# Patient Record
Sex: Male | Born: 1965 | Race: Black or African American | Hispanic: No | Smoking: Current every day smoker
Health system: Southern US, Community
[De-identification: ages and names within clinical notes are randomized; demographics above are authoritative.]

## PROBLEM LIST (undated history)

## (undated) DIAGNOSIS — I499 Cardiac arrhythmia, unspecified: Secondary | ICD-10-CM

## (undated) DIAGNOSIS — F32A Depression, unspecified: Secondary | ICD-10-CM

## (undated) DIAGNOSIS — F1010 Alcohol abuse, uncomplicated: Secondary | ICD-10-CM

## (undated) DIAGNOSIS — F329 Major depressive disorder, single episode, unspecified: Secondary | ICD-10-CM

## (undated) DIAGNOSIS — I10 Essential (primary) hypertension: Secondary | ICD-10-CM

## (undated) DIAGNOSIS — F909 Attention-deficit hyperactivity disorder, unspecified type: Secondary | ICD-10-CM

---

## 2006-08-27 ENCOUNTER — Emergency Department (HOSPITAL_COMMUNITY): Admission: EM | Admit: 2006-08-27 | Discharge: 2006-08-27 | Payer: Self-pay | Admitting: Emergency Medicine

## 2006-12-19 ENCOUNTER — Emergency Department (HOSPITAL_COMMUNITY): Admission: EM | Admit: 2006-12-19 | Discharge: 2006-12-19 | Payer: Self-pay | Admitting: Emergency Medicine

## 2007-04-23 ENCOUNTER — Emergency Department (HOSPITAL_COMMUNITY): Admission: EM | Admit: 2007-04-23 | Discharge: 2007-04-23 | Payer: Self-pay | Admitting: Emergency Medicine

## 2008-09-06 ENCOUNTER — Emergency Department (HOSPITAL_COMMUNITY): Admission: EM | Admit: 2008-09-06 | Discharge: 2008-09-06 | Payer: Self-pay | Admitting: Emergency Medicine

## 2008-10-18 ENCOUNTER — Ambulatory Visit: Payer: Self-pay | Admitting: Orthopedic Surgery

## 2008-10-18 DIAGNOSIS — M51379 Other intervertebral disc degeneration, lumbosacral region without mention of lumbar back pain or lower extremity pain: Secondary | ICD-10-CM | POA: Insufficient documentation

## 2008-10-18 DIAGNOSIS — M5137 Other intervertebral disc degeneration, lumbosacral region: Secondary | ICD-10-CM

## 2008-10-18 DIAGNOSIS — M549 Dorsalgia, unspecified: Secondary | ICD-10-CM | POA: Insufficient documentation

## 2008-10-25 ENCOUNTER — Encounter: Payer: Self-pay | Admitting: Orthopedic Surgery

## 2008-10-25 ENCOUNTER — Encounter (HOSPITAL_COMMUNITY): Admission: RE | Admit: 2008-10-25 | Discharge: 2008-11-24 | Payer: Self-pay | Admitting: Orthopedic Surgery

## 2008-10-26 ENCOUNTER — Encounter: Payer: Self-pay | Admitting: Orthopedic Surgery

## 2008-11-04 ENCOUNTER — Telehealth: Payer: Self-pay | Admitting: Orthopedic Surgery

## 2008-11-10 ENCOUNTER — Encounter: Payer: Self-pay | Admitting: Orthopedic Surgery

## 2008-12-27 ENCOUNTER — Emergency Department (HOSPITAL_COMMUNITY): Admission: EM | Admit: 2008-12-27 | Discharge: 2008-12-27 | Payer: Self-pay | Admitting: Emergency Medicine

## 2009-04-11 ENCOUNTER — Emergency Department (HOSPITAL_COMMUNITY): Admission: EM | Admit: 2009-04-11 | Discharge: 2009-04-11 | Payer: Self-pay | Admitting: Emergency Medicine

## 2009-05-03 ENCOUNTER — Ambulatory Visit (HOSPITAL_COMMUNITY): Admission: RE | Admit: 2009-05-03 | Discharge: 2009-05-03 | Payer: Self-pay | Admitting: Family Medicine

## 2009-12-30 ENCOUNTER — Ambulatory Visit (HOSPITAL_COMMUNITY): Admission: RE | Admit: 2009-12-30 | Discharge: 2009-12-30 | Payer: Self-pay | Admitting: Family Medicine

## 2010-01-18 ENCOUNTER — Emergency Department (HOSPITAL_COMMUNITY): Admission: EM | Admit: 2010-01-18 | Discharge: 2010-01-18 | Payer: Self-pay | Admitting: Emergency Medicine

## 2010-02-22 ENCOUNTER — Emergency Department (HOSPITAL_COMMUNITY): Admission: EM | Admit: 2010-02-22 | Discharge: 2010-02-22 | Payer: Self-pay | Admitting: Emergency Medicine

## 2010-03-08 ENCOUNTER — Emergency Department (HOSPITAL_COMMUNITY): Admission: EM | Admit: 2010-03-08 | Discharge: 2010-03-08 | Payer: Self-pay | Admitting: Emergency Medicine

## 2010-09-06 LAB — BASIC METABOLIC PANEL
BUN: 10 mg/dL (ref 6–23)
CO2: 29 mEq/L (ref 19–32)
Chloride: 103 mEq/L (ref 96–112)
GFR calc non Af Amer: >60 mL/min (ref >60)
Glucose, Bld: 90 mg/dL (ref 70–99)
Potassium: 3.3 mEq/L — ABNORMAL LOW (ref 3.5–5.1)
Sodium: 139 mEq/L (ref 135–145)

## 2010-09-06 LAB — CBC
HCT: 46 % (ref 39.0–52.0)
MCHC: 34.2 g/dL (ref 30.0–36.0)
MCV: 94.9 fL (ref 78.0–100.0)
WBC: 6.7 10*3/uL (ref 4.0–10.5)

## 2010-09-06 LAB — RAPID URINE DRUG SCREEN, HOSP PERFORMED
Amphetamines: NOT DETECTED
Barbiturates: NOT DETECTED
Benzodiazepines: NOT DETECTED
Opiates: NOT DETECTED

## 2010-09-06 LAB — CK TOTAL AND CKMB (NOT AT ARMC)
CK, MB: 3.7 ng/mL (ref 0.3–4.0)
Relative Index: 0.2 (ref 0.0–2.5)

## 2010-09-06 LAB — DIFFERENTIAL
Basophils Absolute: 0 10*3/uL (ref 0.0–0.1)
Lymphocytes Relative: 23 % (ref 12–46)
Neutro Abs: 4.3 10*3/uL (ref 1.7–7.7)

## 2010-09-10 LAB — BASIC METABOLIC PANEL
BUN: 11 mg/dL (ref 6–23)
CO2: 26 mEq/L (ref 19–32)
Calcium: 9.2 mg/dL (ref 8.4–10.5)
Chloride: 106 mEq/L (ref 96–112)
GFR calc Af Amer: >60 mL/min (ref >60)
Glucose, Bld: 95 mg/dL (ref 70–99)
Sodium: 136 mEq/L (ref 135–145)

## 2010-09-10 LAB — RAPID URINE DRUG SCREEN, HOSP PERFORMED
Amphetamines: NOT DETECTED
Cocaine: NOT DETECTED

## 2010-09-10 LAB — POCT CARDIAC MARKERS: Myoglobin, poc: 54.5 ng/mL (ref 12–200)

## 2010-09-13 LAB — CBC
MCHC: 33.4 g/dL (ref 30.0–36.0)
MCV: 89.1 fL (ref 78.0–100.0)
Platelets: 243 10*3/uL (ref 150–400)
RDW: 13.6 % (ref 11.5–15.5)

## 2010-09-13 LAB — POCT CARDIAC MARKERS
CKMB, poc: <1 ng/mL — ABNORMAL LOW (ref 1.0–8.0)
Troponin i, poc: <0.05 ng/mL (ref 0.00–0.09)

## 2010-09-13 LAB — DIFFERENTIAL
Eosinophils Absolute: 0 10*3/uL (ref 0.0–0.7)
Lymphocytes Relative: 4 % — ABNORMAL LOW (ref 12–46)
Lymphs Abs: 0.3 10*3/uL — ABNORMAL LOW (ref 0.7–4.0)
Monocytes Relative: 7 % (ref 3–12)
Neutro Abs: 6.7 10*3/uL (ref 1.7–7.7)

## 2010-09-13 LAB — LIPASE, BLOOD: Lipase: 14 U/L (ref 11–59)

## 2010-09-13 LAB — BASIC METABOLIC PANEL
Chloride: 107 mEq/L (ref 96–112)
Creatinine, Ser: 0.95 mg/dL (ref 0.4–1.5)
GFR calc non Af Amer: >60 mL/min (ref >60)

## 2010-09-13 LAB — HEPATIC FUNCTION PANEL
ALT: 50 U/L (ref 0–53)
Alkaline Phosphatase: 73 U/L (ref 39–117)
Indirect Bilirubin: 0.8 mg/dL (ref 0.3–0.9)

## 2011-03-19 LAB — CBC
MCHC: 33.6
MCV: 92.5
Platelets: 260
WBC: 7.8

## 2011-03-19 LAB — COMPREHENSIVE METABOLIC PANEL
Calcium: 9.4
Chloride: 102
GFR calc Af Amer: >60
Glucose, Bld: 132 — ABNORMAL HIGH
Potassium: 3.5
Total Protein: 7.8

## 2011-03-19 LAB — DIFFERENTIAL
Lymphocytes Relative: 21
Lymphs Abs: 1.7
Monocytes Absolute: 0.9 — ABNORMAL HIGH
Monocytes Relative: 12 — ABNORMAL HIGH
Neutro Abs: 5.2

## 2011-03-28 ENCOUNTER — Emergency Department (HOSPITAL_COMMUNITY)
Admission: EM | Admit: 2011-03-28 | Discharge: 2011-03-28 | Disposition: A | Discharge status: Home / Self Care | Payer: Self-pay | Attending: Emergency Medicine | Admitting: Emergency Medicine

## 2011-03-28 ENCOUNTER — Encounter: Payer: Self-pay | Admitting: *Deleted

## 2011-03-28 DIAGNOSIS — F909 Attention-deficit hyperactivity disorder, unspecified type: Secondary | ICD-10-CM | POA: Insufficient documentation

## 2011-03-28 DIAGNOSIS — X838XXA Intentional self-harm by other specified means, initial encounter: Secondary | ICD-10-CM | POA: Insufficient documentation

## 2011-03-28 DIAGNOSIS — F3289 Other specified depressive episodes: Secondary | ICD-10-CM | POA: Insufficient documentation

## 2011-03-28 DIAGNOSIS — F329 Major depressive disorder, single episode, unspecified: Secondary | ICD-10-CM | POA: Insufficient documentation

## 2011-03-28 DIAGNOSIS — F17200 Nicotine dependence, unspecified, uncomplicated: Secondary | ICD-10-CM | POA: Insufficient documentation

## 2011-03-28 DIAGNOSIS — F1210 Cannabis abuse, uncomplicated: Secondary | ICD-10-CM | POA: Insufficient documentation

## 2011-03-28 HISTORY — DX: Major depressive disorder, single episode, unspecified: F32.9

## 2011-03-28 HISTORY — DX: Depression, unspecified: F32.A

## 2011-03-28 HISTORY — DX: Attention-deficit hyperactivity disorder, unspecified type: F90.9

## 2011-03-28 LAB — RAPID URINE DRUG SCREEN, HOSP PERFORMED
Barbiturates: NOT DETECTED
Cocaine: NOT DETECTED

## 2011-03-28 LAB — CBC
HCT: 46.4 % (ref 39.0–52.0)
Hemoglobin: 15.3 g/dL (ref 13.0–17.0)
MCH: 29.8 pg (ref 26.0–34.0)
MCHC: 33 g/dL (ref 30.0–36.0)

## 2011-03-28 LAB — COMPREHENSIVE METABOLIC PANEL
Alkaline Phosphatase: 87 U/L (ref 39–117)
BUN: 12 mg/dL (ref 6–23)
Calcium: 9.8 mg/dL (ref 8.4–10.5)
GFR calc Af Amer: >90 mL/min (ref >90)
Glucose, Bld: 103 mg/dL — ABNORMAL HIGH (ref 70–99)
Potassium: 3.7 mEq/L (ref 3.5–5.1)
Total Protein: 8.6 g/dL — ABNORMAL HIGH (ref 6.0–8.3)

## 2011-03-28 LAB — ETHANOL: Alcohol, Ethyl (B): <11 mg/dL (ref 0–11)

## 2011-03-28 NOTE — ED Provider Notes (Signed)
History     CSN: [id number] Arrival date & time: 03/28/2011  1:33 PM     Chief Complaint  Patient presents with  . Suicidal    HPI Pt was seen at 1425.  Per pt, c/o gradual onset and persistence of constant depression and vague SI that began PTA.  Pt states he was in this ED waiting room at the [facility] when he called [facility] and told them he was "stressed" and having "suicidal thoughts."  States he was upset because he has "nowhere to stay" because he was kicked out of his home last night.  Denies any specific plan.  Denies any other complaints.     Past Medical History  Diagnosis Date  . Depression   . ADHD (attention deficit hyperactivity disorder)     History reviewed. No pertinent past surgical history.    History  Substance Use Topics  . Smoking status: Current Everyday Smoker -- 0.5 packs/day  . Smokeless tobacco: Not on file  . Alcohol Use: No      Review of Systems ROS: Statement: All systems negative except as marked or noted in the HPI; Constitutional: Negative for fever and chills. ; ; Eyes: Negative for eye pain, redness and discharge. ; ; ENMT: Negative for ear pain, hoarseness, nasal congestion, sinus pressure and sore throat. ; ; Cardiovascular: Negative for chest pain, palpitations, diaphoresis, dyspnea and peripheral edema. ; ; Respiratory: Negative for cough, wheezing and stridor. ; ; Gastrointestinal: Negative for nausea, vomiting, diarrhea and abdominal pain, blood in stool, hematemesis, jaundice and rectal bleeding. . ; ; Genitourinary: Negative for dysuria, flank pain and hematuria. ; ; Musculoskeletal: Negative for back pain and neck pain. Negative for swelling and trauma.; ; Skin: Negative for pruritus, rash, abrasions, blisters, bruising and skin lesion.; ; Neuro: Negative for headache, lightheadedness and neck stiffness. Negative for weakness, altered level of consciousness , altered mental status, extremity weakness, paresthesias, involuntary  movement, seizure and syncope. +vague SI.   Allergies  Review of patient's allergies indicates no known allergies.  Home Medications  No current outpatient prescriptions on file.  BP 139/94  Pulse 89  Temp(Src) 99.4 F (37.4 C) (Oral)  Resp 20  Ht 5\' 11"  (1.803 m)  Wt 170 lb (77.111 kg)  BMI 23.71 kg/m2  SpO2 96%  Physical Exam 1430: Physical examination:  Nursing notes reviewed; Vital signs and O2 SAT reviewed;  Constitutional: Well developed, Well nourished, Well hydrated, In no acute distress; Head:  Normocephalic, atraumatic; Eyes: EOMI, PERRL, No scleral icterus; ENMT: Mouth and pharynx normal, Mucous membranes moist; Neck: Supple, Full range of motion, No lymphadenopathy; Cardiovascular: Regular rate and rhythm, No murmur, rub, or gallop; Respiratory: Breath sounds clear & equal bilaterally, No rales, rhonchi, wheezes, or rub, Normal respiratory effort/excursion; Chest: Nontender, Movement normal; Abdomen: Soft, Nontender, Nondistended, Normal bowel sounds; Extremities: Pulses normal, No tenderness, No edema, No calf edema or asymmetry.; Neuro: AA&Ox3, Major CN grossly intact.  No gross focal motor or sensory deficits in extremities.; Skin: Color normal, Warm, Dry.  Psych:  Flat affect, poor eye contact.   ED Course  Procedures   1500:  Social Worker called for consult, states she is at [facility] and not here at [facility] today.  No known shelters in [place] to refer to that she knows of, pt would need to get to [facility].  Cannot go to shelter if a question of SI however.  Will need ACT eval.    MDM  MDM Reviewed: nursing note and vitals  Interpretation: labs     Results for orders placed during the hospital encounter of 03/28/11  CBC      Component Value Range   WBC 8.1  4.0 - 10.5 (K/uL)   RBC 5.13  4.22 - 5.81 (MIL/uL)   Hemoglobin 15.3  13.0 - 17.0 (g/dL)   HCT 16.1  09.6 - 04.5 (%)   MCV 90.4  78.0 - 100.0 (fL)   MCH 29.8  26.0 - 34.0 (pg)   MCHC 33.0  30.0 -  36.0 (g/dL)   RDW 40.9  81.1 - 91.4 (%)   Platelets 322  150 - 400 (K/uL)  COMPREHENSIVE METABOLIC PANEL      Component Value Range   Sodium 137  135 - 145 (mEq/L)   Potassium 3.7  3.5 - 5.1 (mEq/L)   Chloride 102  96 - 112 (mEq/L)   CO2 27  19 - 32 (mEq/L)   Glucose, Bld 103 (*) 70 - 99 (mg/dL)   BUN 12  6 - 23 (mg/dL)   Creatinine, Ser 7.82  0.50 - 1.35 (mg/dL)   Calcium 9.8  8.4 - 95.6 (mg/dL)   Total Protein 8.6 (*) 6.0 - 8.3 (g/dL)   Albumin 4.0  3.5 - 5.2 (g/dL)   AST 34  0 - 37 (U/L)   ALT 21  0 - 53 (U/L)   Alkaline Phosphatase 87  39 - 117 (U/L)   Total Bilirubin 0.4  0.3 - 1.2 (mg/dL)   GFR calc non Af Amer >90  >90 (mL/min)   GFR calc Af Amer >90  >90 (mL/min)  ETHANOL      Component Value Range   Alcohol, Ethyl (B) <11  0 - 11 (mg/dL)  ACETAMINOPHEN LEVEL      Component Value Range   Acetaminophen (Tylenol), Serum <15.0  10 - 30 (ug/mL)  URINE RAPID DRUG SCREEN (HOSP PERFORMED)      Component Value Range   Opiates NONE DETECTED  NONE DETECTED    Cocaine NONE DETECTED  NONE DETECTED    Benzodiazepines NONE DETECTED  NONE DETECTED    Amphetamines NONE DETECTED  NONE DETECTED    Tetrahydrocannabinol POSITIVE (*) NONE DETECTED    Barbiturates NONE DETECTED  NONE DETECTED    5:07 PM:  ACT aware of pt, will eval.   9:06 PM:  ACT team Ella eval.  Pt denies further SI at this time.  Apparently pt was hungry and wanted a place to stay.  States he has friends and family in the area and can stay with them tonight.  Contracts for safety.  Will d/c home with [facility] f/u.   Kasiah Manka Allison Quarry, DO 03/29/11 1856

## 2011-03-28 NOTE — ED Notes (Signed)
Pt was in lobby on the phone with [facility]. [facility] called ed to inform us pt was having suicidal thoughts. Pt states he is stressed and has been thinking of killing himself. Pt states he was kicked out of his home last night.

## 2011-03-28 NOTE — ED Notes (Signed)
Pt up to restroom, no obvious distress observed.

## 2011-04-15 ENCOUNTER — Encounter (HOSPITAL_COMMUNITY): Payer: Self-pay | Admitting: Emergency Medicine

## 2011-04-15 ENCOUNTER — Other Ambulatory Visit: Payer: Self-pay

## 2011-04-15 ENCOUNTER — Emergency Department (HOSPITAL_COMMUNITY)
Admission: EM | Admit: 2011-04-15 | Discharge: 2011-04-15 | Discharge status: Against Medical Advice | Payer: Self-pay | Attending: Emergency Medicine | Admitting: Emergency Medicine

## 2011-04-15 DIAGNOSIS — F17200 Nicotine dependence, unspecified, uncomplicated: Secondary | ICD-10-CM | POA: Insufficient documentation

## 2011-04-15 DIAGNOSIS — F329 Major depressive disorder, single episode, unspecified: Secondary | ICD-10-CM | POA: Insufficient documentation

## 2011-04-15 DIAGNOSIS — R5381 Other malaise: Secondary | ICD-10-CM | POA: Insufficient documentation

## 2011-04-15 DIAGNOSIS — Z5320 Procedure and treatment not carried out because of patient's decision for unspecified reasons: Secondary | ICD-10-CM | POA: Insufficient documentation

## 2011-04-15 DIAGNOSIS — R5383 Other fatigue: Secondary | ICD-10-CM | POA: Insufficient documentation

## 2011-04-15 DIAGNOSIS — F3289 Other specified depressive episodes: Secondary | ICD-10-CM | POA: Insufficient documentation

## 2011-04-15 HISTORY — DX: Cardiac arrhythmia, unspecified: I49.9

## 2011-04-15 LAB — RAPID URINE DRUG SCREEN, HOSP PERFORMED
Amphetamines: NOT DETECTED
Barbiturates: NOT DETECTED
Benzodiazepines: NOT DETECTED
Tetrahydrocannabinol: POSITIVE — AB

## 2011-04-15 LAB — BASIC METABOLIC PANEL
CO2: 29 mEq/L (ref 19–32)
Chloride: 105 mEq/L (ref 96–112)
Creatinine, Ser: 0.97 mg/dL (ref 0.50–1.35)
Potassium: 3.6 mEq/L (ref 3.5–5.1)

## 2011-04-15 LAB — CBC
HCT: 45.6 % (ref 39.0–52.0)
Hemoglobin: 14.5 g/dL (ref 13.0–17.0)
MCV: 92.3 fL (ref 78.0–100.0)
RBC: 4.94 MIL/uL (ref 4.22–5.81)
RDW: 15.1 % (ref 11.5–15.5)
WBC: 5.3 10*3/uL (ref 4.0–10.5)

## 2011-04-15 LAB — ETHANOL: Alcohol, Ethyl (B): 194 mg/dL — ABNORMAL HIGH (ref 0–11)

## 2011-04-15 NOTE — ED Notes (Addendum)
Patient states he is depressed and "don't know how to live." Admitted to drinking one 40oz beer tonight.

## 2011-04-15 NOTE — ED Provider Notes (Signed)
History     CSN: [id number] Arrival date & time: 04/15/2011  2:13 AM   First MD Initiated Contact with Patient 04/15/11 0230      Chief Complaint  Patient presents with  . Depression    (Consider location/radiation/quality/duration/timing/severity/associated sxs/prior treatment) The history is provided by the patient.   patient is a 45 year old male admits to feeling depressed and stressed due to life situation. Denies any homicidal or suicidal ideation. Has no other complaints once help with the history depression. Admitted to drinking alcohol tonight the patient is a smoker denied any drug use. Patient has a past medical history depression attention deficit hyperactivity disorder and irregular heartbeat.  Past Medical History  Diagnosis Date  . Depression   . ADHD (attention deficit hyperactivity disorder)   . Irregular heart beat     History reviewed. No pertinent past surgical history.  History reviewed. No pertinent family history.  History  Substance Use Topics  . Smoking status: Current Everyday Smoker -- 0.5 packs/day  . Smokeless tobacco: Not on file  . Alcohol Use: Not on file     weekends      Review of Systems  Constitutional: Positive for fatigue. Negative for fever.  HENT: Negative for congestion and neck pain.   Eyes: Negative for redness and visual disturbance.  Respiratory: Negative for cough, chest tightness and shortness of breath.   Cardiovascular: Negative for chest pain.  Gastrointestinal: Negative for nausea, vomiting, abdominal pain and diarrhea.  Genitourinary: Negative for dysuria.  Musculoskeletal: Negative for back pain.  Skin: Negative for rash.  Neurological: Negative for headaches.  Hematological: Does not bruise/bleed easily.  Psychiatric/Behavioral: Negative for suicidal ideas and self-injury.    Allergies  Review of patient's allergies indicates no known allergies.  Home Medications  No current outpatient prescriptions on  file.  BP 150/98  Pulse 100  Temp(Src) 97.7 F (36.5 C) (Oral)  Resp 20  Ht 5\' 11"  (1.803 m)  Wt 160 lb (72.576 kg)  BMI 22.32 kg/m2  SpO2 98%  Physical Exam  Nursing note and vitals reviewed. Constitutional: He is oriented to person, place, and time. He appears well-developed and well-nourished.  HENT:  Head: Normocephalic and atraumatic.  Eyes: Conjunctivae and EOM are normal. Pupils are equal, round, and reactive to light.  Neck: Normal range of motion. Neck supple.  Cardiovascular: Normal rate, regular rhythm and normal heart sounds.   No murmur heard. Pulmonary/Chest: Effort normal and breath sounds normal.  Abdominal: Soft. Bowel sounds are normal. There is no tenderness.  Musculoskeletal: Normal range of motion. He exhibits no edema.  Neurological: He is alert and oriented to person, place, and time. No cranial nerve deficit. He exhibits normal muscle tone. Coordination normal.  Skin: Skin is warm. No rash noted. No erythema.    ED Course  Procedures (including critical care time)  Labs Reviewed  URINE RAPID DRUG SCREEN (HOSP PERFORMED) - Abnormal; Notable for the following:    Tetrahydrocannabinol POSITIVE (*)    All other components within normal limits  BASIC METABOLIC PANEL  CBC  ETHANOL    Date: 04/15/2011  Rate: 78  Rhythm: normal sinus rhythm  QRS Axis: normal  Intervals: normal  ST/T Wave abnormalities: early repolarization  Conduction Disutrbances:none  Narrative Interpretation:   Old EKG Reviewed: unchanged 04/11/09   No diagnosis found.    MDM   The patient specifically denies any suicidal thoughts does admit to some depression and is currently homeless. Patient admitted to drinking alcohol tonight but will level  is below 200. We'll have the behavioral health team assessed the patient later this morning.        Shelda Jakes, MD 04/15/11 [phone]

## 2011-04-15 NOTE — ED Notes (Signed)
Pt reports he is has some anxiety and "feeling bad and needs help".  Denies suicidal thoughts or plan.  Denies thoughts of harming others.  Pt reports he did drink one 40 oz beer tonight.  No distress noted.

## 2011-05-15 ENCOUNTER — Other Ambulatory Visit: Payer: Self-pay

## 2011-05-15 ENCOUNTER — Emergency Department (HOSPITAL_COMMUNITY)
Admission: EM | Admit: 2011-05-15 | Discharge: 2011-05-15 | Disposition: A | Discharge status: Home / Self Care | Payer: Self-pay | Attending: Emergency Medicine | Admitting: Emergency Medicine

## 2011-05-15 ENCOUNTER — Encounter (HOSPITAL_COMMUNITY): Payer: Self-pay | Admitting: *Deleted

## 2011-05-15 ENCOUNTER — Emergency Department (HOSPITAL_COMMUNITY): Payer: Self-pay

## 2011-05-15 DIAGNOSIS — R059 Cough, unspecified: Secondary | ICD-10-CM | POA: Insufficient documentation

## 2011-05-15 DIAGNOSIS — R071 Chest pain on breathing: Secondary | ICD-10-CM | POA: Insufficient documentation

## 2011-05-15 DIAGNOSIS — F17200 Nicotine dependence, unspecified, uncomplicated: Secondary | ICD-10-CM | POA: Insufficient documentation

## 2011-05-15 DIAGNOSIS — R0789 Other chest pain: Secondary | ICD-10-CM

## 2011-05-15 DIAGNOSIS — J3489 Other specified disorders of nose and nasal sinuses: Secondary | ICD-10-CM | POA: Insufficient documentation

## 2011-05-15 DIAGNOSIS — R05 Cough: Secondary | ICD-10-CM | POA: Insufficient documentation

## 2011-05-15 LAB — POCT I-STAT TROPONIN I: Troponin i, poc: 0 ng/mL (ref 0.00–0.08)

## 2011-05-15 LAB — CBC
MCV: 92.2 fL (ref 78.0–100.0)
Platelets: 233 10*3/uL (ref 150–400)
RDW: 14.5 % (ref 11.5–15.5)
WBC: 5 10*3/uL (ref 4.0–10.5)

## 2011-05-15 LAB — DIFFERENTIAL
Basophils Absolute: 0 10*3/uL (ref 0.0–0.1)
Eosinophils Relative: 0 % (ref 0–5)
Lymphocytes Relative: 21 % (ref 12–46)
Neutro Abs: 3.4 10*3/uL (ref 1.7–7.7)

## 2011-05-15 LAB — BASIC METABOLIC PANEL
CO2: 25 mEq/L (ref 19–32)
Calcium: 10.2 mg/dL (ref 8.4–10.5)
GFR calc Af Amer: >90 mL/min (ref >90)
Sodium: 140 mEq/L (ref 135–145)

## 2011-05-15 MED ORDER — NAPROXEN 500 MG PO TABS: 500.0000 mg | ORAL_TABLET | Freq: Two times a day (BID) | ORAL | Status: DC

## 2011-05-15 MED ORDER — KETOROLAC TROMETHAMINE 30 MG/ML IJ SOLN
30.0000 mg | Freq: Once | INTRAMUSCULAR | Status: AC
  Administered 2011-05-15: 30 mg via INTRAVENOUS
  Filled 2011-05-15: qty 1

## 2011-05-15 MED ORDER — SODIUM CHLORIDE 0.9 % IV SOLN
Freq: Once | INTRAVENOUS | Status: AC
  Administered 2011-05-15: 05:00:00 via INTRAVENOUS

## 2011-05-15 MED ORDER — TRAMADOL HCL 50 MG PO TABS: 50.0000 mg | ORAL_TABLET | Freq: Four times a day (QID) | ORAL | Status: AC | PRN

## 2011-05-15 MED ORDER — MORPHINE SULFATE 4 MG/ML IJ SOLN
4.0000 mg | Freq: Once | INTRAMUSCULAR | Status: AC
  Administered 2011-05-15: 4 mg via INTRAVENOUS
  Filled 2011-05-15: qty 1

## 2011-05-15 NOTE — ED Provider Notes (Signed)
History     CSN: [id number] Arrival date & time: 05/15/2011  3:40 AM   First MD Initiated Contact with Patient 05/15/11 0355      Chief Complaint  Patient presents with  . Chest Pain    (Consider location/radiation/quality/duration/timing/severity/associated sxs/prior treatment) HPI Comments: Recent uri with cough  Patient is a 45 y.o. male presenting with chest pain. The history is provided by the patient. No language interpreter was used.  Chest Pain The chest pain began 5 - 7 days ago. Chest pain occurs constantly. The chest pain is unchanged. The pain is associated with coughing and breathing. The severity of the pain is moderate. The quality of the pain is described as aching and sharp. The pain does not radiate. Exacerbated by: no specific exacerbating measures. Primary symptoms include cough. Pertinent negatives for primary symptoms include no fever, no fatigue, no shortness of breath, no wheezing, no palpitations, no abdominal pain, no nausea, no vomiting and no dizziness.  Pertinent negatives for associated symptoms include no diaphoresis, no near-syncope, no numbness, no paroxysmal nocturnal dyspnea and no weakness. He tried nothing for the symptoms. Risk factors include male gender and smoking/tobacco exposure.     Past Medical History  Diagnosis Date  . Depression   . ADHD (attention deficit hyperactivity disorder)   . Irregular heart beat     History reviewed. No pertinent past surgical history.  History reviewed. No pertinent family history.  History  Substance Use Topics  . Smoking status: Current Everyday Smoker -- 0.5 packs/day  . Smokeless tobacco: Not on file  . Alcohol Use: Yes     weekends      Review of Systems  Constitutional: Negative for fever, diaphoresis, activity change, appetite change and fatigue.  HENT: Positive for congestion. Negative for sore throat, rhinorrhea, neck pain and neck stiffness.   Respiratory: Positive for cough. Negative  for shortness of breath and wheezing.   Cardiovascular: Positive for chest pain. Negative for palpitations and near-syncope.  Gastrointestinal: Negative for nausea, vomiting, abdominal pain, diarrhea and constipation.  Genitourinary: Negative for dysuria, urgency, frequency and flank pain.  Neurological: Negative for dizziness, weakness, light-headedness, numbness and headaches.  All other systems reviewed and are negative.    Allergies  Review of patient's allergies indicates no known allergies.  Home Medications   Current Outpatient Rx  Name Route Sig Dispense Refill  . NAPROXEN 500 MG PO TABS Oral Take 1 tablet (500 mg total) by mouth 2 (two) times daily. 30 tablet 0  . TRAMADOL HCL 50 MG PO TABS Oral Take 1 tablet (50 mg total) by mouth every 6 (six) hours as needed for pain. Maximum dose= 8 tablets per day 15 tablet 0    BP 136/91  Pulse 85  Temp(Src) 98.8 F (37.1 C) (Oral)  Resp 18  SpO2 98%  Physical Exam  Nursing note and vitals reviewed. Constitutional: He is oriented to person, place, and time. He appears well-developed and well-nourished. No distress.  HENT:  Head: Normocephalic and atraumatic.  Mouth/Throat: Oropharynx is clear and moist.  Eyes: Conjunctivae and EOM are normal. Pupils are equal, round, and reactive to light.  Neck: Normal range of motion. Neck supple.  Cardiovascular: Normal rate, regular rhythm, normal heart sounds and intact distal pulses.  Exam reveals no gallop and no friction rub.   No murmur heard. Pulmonary/Chest: Effort normal and breath sounds normal. No respiratory distress. He exhibits tenderness (parasternally bilaterally).  Abdominal: Soft. Bowel sounds are normal. There is no tenderness. There is  no rebound and no guarding.  Musculoskeletal: Normal range of motion. He exhibits no tenderness.  Neurological: He is alert and oriented to person, place, and time. No cranial nerve deficit.  Skin: Skin is warm and dry. No rash noted.     ED Course  Procedures (including critical care time)   Date: 05/15/2011  Rate: 86  Rhythm: normal sinus rhythm  QRS Axis: normal  Intervals: normal  ST/T Wave abnormalities: early repolarization  Conduction Disutrbances:none  Narrative Interpretation:   Old EKG Reviewed: none available  Labs Reviewed  BASIC METABOLIC PANEL - Abnormal; Notable for the following:    Glucose, Bld 114 (*)    All other components within normal limits  CBC  DIFFERENTIAL  POCT I-STAT TROPONIN I  I-STAT TROPONIN I   5:28 AM istat troponin 0.00 - did not cross over in system  Dg Chest Portable 1 View  05/15/2011  *RADIOLOGY REPORT*  Clinical Data: Chest pain  PORTABLE CHEST - 1 VIEW  Comparison: 05/03/2009  Findings: Borderline heart size with normal pulmonary vascularity, likely normal for technique.  No focal airspace consolidation in the lungs.  No pneumothorax.  No blunting of costophrenic angles. No significant change since previous study.  IMPRESSION: No evidence of active pulmonary disease.  Original Report Authenticated By: Marlon Pel, M.D.     1. Chest wall pain       MDM  Chest wall pain. I have exceedingly low concern for acute coronary syndrome as it causes pain as it has been constant for one week. Single troponin was performed and negative. Chest x-ray performed unremarkable. His pain was treated numerous department with some improvement. Patient is PERC negative with low clinical Gestalt for PE. I feel his pain is likely secondary to chest wall pain versus costochondritis. He did have a recent upper respiratory illness. He'll be treated with Naprosyn and Ultram instructed to followup with primary care physician         Dayton Bailiff, MD 05/15/11 [phone]

## 2011-05-15 NOTE — ED Notes (Signed)
Chest pain since Wednesday,

## 2011-05-15 NOTE — ED Notes (Signed)
Pt given discharge instructions, paperwork & prescription(s), pt verbalized understanding.   

## 2011-05-27 ENCOUNTER — Emergency Department (HOSPITAL_COMMUNITY): Payer: Medicaid Other

## 2011-05-27 ENCOUNTER — Emergency Department (HOSPITAL_COMMUNITY)
Admission: EM | Admit: 2011-05-27 | Discharge: 2011-05-27 | Disposition: A | Discharge status: Home / Self Care | Payer: Medicaid Other | Attending: Emergency Medicine | Admitting: Emergency Medicine

## 2011-05-27 ENCOUNTER — Encounter (HOSPITAL_COMMUNITY): Payer: Self-pay | Admitting: *Deleted

## 2011-05-27 DIAGNOSIS — R221 Localized swelling, mass and lump, neck: Secondary | ICD-10-CM | POA: Insufficient documentation

## 2011-05-27 DIAGNOSIS — S0100XA Unspecified open wound of scalp, initial encounter: Secondary | ICD-10-CM | POA: Insufficient documentation

## 2011-05-27 DIAGNOSIS — F17200 Nicotine dependence, unspecified, uncomplicated: Secondary | ICD-10-CM | POA: Insufficient documentation

## 2011-05-27 DIAGNOSIS — M5030 Other cervical disc degeneration, unspecified cervical region: Secondary | ICD-10-CM | POA: Insufficient documentation

## 2011-05-27 DIAGNOSIS — R220 Localized swelling, mass and lump, head: Secondary | ICD-10-CM | POA: Insufficient documentation

## 2011-05-27 DIAGNOSIS — R4182 Altered mental status, unspecified: Secondary | ICD-10-CM | POA: Insufficient documentation

## 2011-05-27 DIAGNOSIS — IMO0002 Reserved for concepts with insufficient information to code with codable children: Secondary | ICD-10-CM

## 2011-05-27 MED ORDER — HYDROCODONE-ACETAMINOPHEN 5-325 MG PO TABS
2.0000 | ORAL_TABLET | Freq: Once | ORAL | Status: AC
  Administered 2011-05-27: 2 via ORAL
  Filled 2011-05-27: qty 2
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
## 2011-05-27 NOTE — ED Notes (Signed)
RN in to d/c pt, pt remains intoxicated and agitated, when asked who to call for a ride, pt became angry and called the RN "stupid", pt's emergency contact his mother Gerrit Friends contacted and pt hung up on her.  EDP notified and advises to let pt "sober" up for a while
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
## 2011-06-07 ENCOUNTER — Encounter (HOSPITAL_COMMUNITY): Payer: Self-pay

## 2011-06-07 ENCOUNTER — Emergency Department (HOSPITAL_COMMUNITY)
Admission: EM | Admit: 2011-06-07 | Discharge: 2011-06-07 | Disposition: A | Discharge status: Home / Self Care | Payer: Self-pay | Attending: Emergency Medicine | Admitting: Emergency Medicine

## 2011-06-07 DIAGNOSIS — Z4802 Encounter for removal of sutures: Secondary | ICD-10-CM | POA: Insufficient documentation

## 2011-06-07 DIAGNOSIS — IMO0002 Reserved for concepts with insufficient information to code with codable children: Secondary | ICD-10-CM

## 2011-06-07 DIAGNOSIS — F3289 Other specified depressive episodes: Secondary | ICD-10-CM | POA: Insufficient documentation

## 2011-06-07 DIAGNOSIS — F909 Attention-deficit hyperactivity disorder, unspecified type: Secondary | ICD-10-CM | POA: Insufficient documentation

## 2011-06-07 DIAGNOSIS — R51 Headache: Secondary | ICD-10-CM | POA: Insufficient documentation

## 2011-06-07 DIAGNOSIS — F329 Major depressive disorder, single episode, unspecified: Secondary | ICD-10-CM | POA: Insufficient documentation

## 2011-06-07 NOTE — ED Notes (Signed)
3 staples removed without difficulty from pt's head.

## 2011-06-07 NOTE — ED Notes (Signed)
Pt here for staple removal. Staples present to back of head. Denies any complications.
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
## 2011-06-09 NOTE — ED Provider Notes (Signed)
Medical screening examination/treatment/procedure(s) were performed by non-physician practitioner and as supervising physician I was immediately available for consultation/collaboration. Cerrone Debold, MD, FACEP   Angele Wiemann L Arjen Deringer, MD 06/09/11 1239 

## 2011-10-30 ENCOUNTER — Encounter (HOSPITAL_COMMUNITY): Payer: Self-pay

## 2011-10-30 ENCOUNTER — Emergency Department (HOSPITAL_COMMUNITY)
Admission: EM | Admit: 2011-10-30 | Discharge: 2011-10-30 | Disposition: A | Discharge status: Home / Self Care | Payer: Self-pay | Attending: Emergency Medicine | Admitting: Emergency Medicine

## 2011-10-30 DIAGNOSIS — R112 Nausea with vomiting, unspecified: Secondary | ICD-10-CM | POA: Insufficient documentation

## 2011-10-30 DIAGNOSIS — F1010 Alcohol abuse, uncomplicated: Secondary | ICD-10-CM | POA: Insufficient documentation

## 2011-10-30 DIAGNOSIS — F909 Attention-deficit hyperactivity disorder, unspecified type: Secondary | ICD-10-CM | POA: Insufficient documentation

## 2011-10-30 DIAGNOSIS — F3289 Other specified depressive episodes: Secondary | ICD-10-CM | POA: Insufficient documentation

## 2011-10-30 DIAGNOSIS — F329 Major depressive disorder, single episode, unspecified: Secondary | ICD-10-CM | POA: Insufficient documentation

## 2011-10-30 LAB — BASIC METABOLIC PANEL
BUN: 11 mg/dL (ref 6–23)
Calcium: 10 mg/dL (ref 8.4–10.5)
Creatinine, Ser: 1.02 mg/dL (ref 0.50–1.35)
GFR calc Af Amer: >90 mL/min (ref >90)
GFR calc non Af Amer: 87 mL/min — ABNORMAL LOW (ref >90)
Potassium: 3.9 mEq/L (ref 3.5–5.1)

## 2011-10-30 MED ORDER — SODIUM CHLORIDE 0.9 % IV BOLUS (SEPSIS)
1000.0000 mL | INTRAVENOUS | Status: AC
  Administered 2011-10-30: 1000 mL via INTRAVENOUS

## 2011-10-30 MED ORDER — PROMETHAZINE HCL 25 MG PO TABS: 12.5000 mg | ORAL_TABLET | Freq: Four times a day (QID) | ORAL | Status: DC | PRN

## 2011-10-30 MED ORDER — THIAMINE HCL 100 MG/ML IJ SOLN
100.0000 mg | Freq: Once | INTRAMUSCULAR | Status: AC
  Administered 2011-10-30: 100 mg via INTRAVENOUS
  Filled 2011-10-30: qty 2

## 2011-10-30 MED ORDER — ONDANSETRON HCL 4 MG/2ML IJ SOLN
4.0000 mg | Freq: Once | INTRAMUSCULAR | Status: AC
  Administered 2011-10-30: 4 mg via INTRAVENOUS
  Filled 2011-10-30: qty 2

## 2011-10-30 MED ORDER — ADULT MULTIVITAMIN W/MINERALS CH
1.0000 | ORAL_TABLET | Freq: Once | ORAL | Status: AC
  Administered 2011-10-30: 1 via ORAL

## 2011-10-30 MED ORDER — FOLIC ACID 5 MG/ML IJ SOLN
1.0000 mg | Freq: Once | INTRAMUSCULAR | Status: AC
  Administered 2011-10-30: 1 mg via INTRAVENOUS

## 2011-10-30 NOTE — Discharge Instructions (Signed)
Use the nausea medicine as needed.   Alcohol Intoxication Alcohol intoxication means your blood alcohol level is above legal limits. Alcohol is a drug. It has serious side effects. These side effects can include:  Damage to your organs (liver, nervous system, and blood system).   Unclear thinking.   Slowed reflexes.   Decreased muscle coordination.  HOME CARE  Do not drink and drive.   Do not drink alcohol if you are taking medicine or using other drugs. Doing so can cause serious medical problems or even death.   Drink enough water and fluids to keep your pee (urine) clear or pale yellow.   Eat healthy foods.   Only take medicine as told by your doctor.   Join an alcohol support group.  GET HELP RIGHT AWAY IF:  You become shaky when you stop drinking.   Your thinking is unclear or you become confused.   You throw up (vomit) blood. It may look bright red or like coffee grounds.   You notice blood in your poop (bowel movements).   You become lightheaded or pass out (faint).  MAKE SURE YOU:   Understand these instructions.   Will watch your condition.   Will get help right away if you are not doing well or get worse.  Document Released: 11/07/2007 Document Revised: 05/10/2011 Document Reviewed: 11/07/2009 [facility] Patient Information 2012 [place], [place].

## 2011-10-30 NOTE — ED Notes (Signed)
Pt came to registration desk to ask for change for snacks, found shortly afterwards passed out on the floor with  Large amount of emesis in restroom and on the waiting room floor.

## 2011-11-01 NOTE — ED Provider Notes (Signed)
History     CSN: [id number]  Arrival date & time 10/30/11  7846   First MD Initiated Contact with Patient 10/30/11 [phone]      Chief Complaint  Patient presents with  . Emesis    (Consider location/radiation/quality/duration/timing/severity/associated sxs/prior treatment) HPI  Terrence Strickland is a 46 y.o. male who presents to the Emergency Department initially for change to use the vending machine in the waiting room. He had been drinking heavily. He went into the ER bathroom, vomited and passed out on the floor. He stated he had been drinking all day.  Past Medical History  Diagnosis Date  . Depression   . ADHD (attention deficit hyperactivity disorder)   . Irregular heart beat     History reviewed. No pertinent past surgical history.  No family history on file.  History  Substance Use Topics  . Smoking status: Current Everyday Smoker -- 0.5 packs/day  . Smokeless tobacco: Not on file  . Alcohol Use: Yes     weekends      Review of Systems  Constitutional: Negative for fever.       10 Systems reviewed and are negative for acute change except as noted in the HPI.  HENT: Negative for congestion.   Eyes: Negative for discharge and redness.  Respiratory: Negative for cough and shortness of breath.   Cardiovascular: Negative for chest pain.  Gastrointestinal: Positive for nausea and vomiting. Negative for abdominal pain.  Musculoskeletal: Negative for back pain.  Skin: Negative for rash.  Neurological: Negative for syncope, numbness and headaches.  Psychiatric/Behavioral:       No behavior change.    Allergies  Review of patient's allergies indicates no known allergies.  Home Medications   Current Outpatient Rx  Name Route Sig Dispense Refill  . PROMETHAZINE HCL 25 MG PO TABS Oral Take 0.5 tablets (12.5 mg total) by mouth every 6 (six) hours as needed for nausea. 10 tablet 0    BP 114/68  Pulse 74  Resp 16  Ht 5\' 11"  (1.803 m)  Wt 160 lb (72.576 kg)  BMI  22.32 kg/m2  SpO2 98%  Physical Exam  Nursing note and vitals reviewed. Constitutional:       Awake, alert, intoxicated  HENT:  Head: Normocephalic and atraumatic.  Right Ear: External ear normal.  Left Ear: External ear normal.  Mouth/Throat: Oropharynx is clear and moist.  Eyes: Right eye exhibits no discharge. Left eye exhibits no discharge.  Neck: Normal range of motion. Neck supple.  Cardiovascular: Normal rate, regular rhythm and normal heart sounds.   Pulmonary/Chest: Effort normal and breath sounds normal. No respiratory distress. He has no wheezes. He exhibits no tenderness.  Abdominal: Soft. There is no tenderness. There is no rebound.  Musculoskeletal: Normal range of motion. He exhibits no tenderness.       Baseline ROM, no obvious new focal weakness.  Neurological: He is alert. He has normal reflexes.       Mental status and motor strength appears baseline for patient and situation.  Skin: No rash noted.  Psychiatric: He has a normal mood and affect.    ED Course  Procedures (including critical care time)  Results for orders placed during the hospital encounter of 10/30/11  ETHANOL      Component Value Range   Alcohol, Ethyl (B) 77 (*) 0 - 11 (mg/dL)  BASIC METABOLIC PANEL      Component Value Range   Sodium 139  135 - 145 (mEq/L)   Potassium  3.9  3.5 - 5.1 (mEq/L)   Chloride 103  96 - 112 (mEq/L)   CO2 26  19 - 32 (mEq/L)   Glucose, Bld 89  70 - 99 (mg/dL)   BUN 11  6 - 23 (mg/dL)   Creatinine, Ser 8.11  0.50 - 1.35 (mg/dL)   Calcium 91.4  8.4 - 10.5 (mg/dL)   GFR calc non Af Amer 87 (*) >90 (mL/min)   GFR calc Af Amer >90  >90 (mL/min)     1. Alcohol abuse   2. Nausea and vomiting       MDM  Patient who presented with alcohol intoxication, vomiting, and syncope. Given IVF, antiemetic, folic acid, thiamine, multivitamin. He took PO fluids, walked to the bathroom unassisted. A fried is here to take him home. Pt stable in ED with no significant  deterioration in condition.The patient appears reasonably screened and/or stabilized for discharge and I doubt any other medical condition or other [facility] requiring further screening, evaluation, or treatment in the ED at this time prior to discharge.  MDM Reviewed: nursing note and vitals Interpretation: labs           Nicoletta Dress. Colon Branch, MD 11/01/11 2204

## 2012-01-28 ENCOUNTER — Other Ambulatory Visit (HOSPITAL_COMMUNITY): Payer: Self-pay | Admitting: Family Medicine

## 2012-01-28 ENCOUNTER — Ambulatory Visit (HOSPITAL_COMMUNITY)
Admission: RE | Admit: 2012-01-28 | Discharge: 2012-01-28 | Disposition: A | Discharge status: Home / Self Care | Payer: Medicaid Other | Source: Ambulatory Visit | Attending: Family Medicine | Admitting: Family Medicine

## 2012-01-28 DIAGNOSIS — M545 Low back pain, unspecified: Secondary | ICD-10-CM | POA: Insufficient documentation

## 2012-01-28 DIAGNOSIS — M51379 Other intervertebral disc degeneration, lumbosacral region without mention of lumbar back pain or lower extremity pain: Secondary | ICD-10-CM | POA: Insufficient documentation

## 2012-01-28 DIAGNOSIS — M5137 Other intervertebral disc degeneration, lumbosacral region: Secondary | ICD-10-CM | POA: Insufficient documentation

## 2012-06-25 ENCOUNTER — Emergency Department (HOSPITAL_COMMUNITY)
Admission: EM | Admit: 2012-06-25 | Discharge: 2012-06-25 | Disposition: A | Discharge status: Home / Self Care | Payer: Medicaid Other | Attending: Emergency Medicine | Admitting: Emergency Medicine

## 2012-06-25 ENCOUNTER — Encounter (HOSPITAL_COMMUNITY): Payer: Self-pay

## 2012-06-25 DIAGNOSIS — F909 Attention-deficit hyperactivity disorder, unspecified type: Secondary | ICD-10-CM | POA: Insufficient documentation

## 2012-06-25 DIAGNOSIS — Y9389 Activity, other specified: Secondary | ICD-10-CM | POA: Insufficient documentation

## 2012-06-25 DIAGNOSIS — Z8679 Personal history of other diseases of the circulatory system: Secondary | ICD-10-CM | POA: Insufficient documentation

## 2012-06-25 DIAGNOSIS — Y929 Unspecified place or not applicable: Secondary | ICD-10-CM | POA: Insufficient documentation

## 2012-06-25 DIAGNOSIS — F17200 Nicotine dependence, unspecified, uncomplicated: Secondary | ICD-10-CM | POA: Insufficient documentation

## 2012-06-25 DIAGNOSIS — W260XXA Contact with knife, initial encounter: Secondary | ICD-10-CM | POA: Insufficient documentation

## 2012-06-25 DIAGNOSIS — Z8659 Personal history of other mental and behavioral disorders: Secondary | ICD-10-CM | POA: Insufficient documentation

## 2012-06-25 DIAGNOSIS — S61411A Laceration without foreign body of right hand, initial encounter: Secondary | ICD-10-CM

## 2012-06-25 DIAGNOSIS — Z23 Encounter for immunization: Secondary | ICD-10-CM | POA: Insufficient documentation

## 2012-06-25 DIAGNOSIS — S61409A Unspecified open wound of unspecified hand, initial encounter: Secondary | ICD-10-CM | POA: Insufficient documentation

## 2012-06-25 MED ORDER — LIDOCAINE HCL (PF) 1 % IJ SOLN
INTRAMUSCULAR | Status: AC
  Administered 2012-06-25: 02:00:00
  Filled 2012-06-25: qty 5

## 2012-06-25 MED ORDER — TETANUS-DIPHTH-ACELL PERTUSSIS 5-2.5-18.5 LF-MCG/0.5 IM SUSP
0.5000 mL | Freq: Once | INTRAMUSCULAR | Status: AC
  Administered 2012-06-25: 0.5 mL via INTRAMUSCULAR
  Filled 2012-06-25: qty 0.5

## 2012-06-25 NOTE — ED Notes (Signed)
Cut to palm of right hand from box cutter while he was trying to cut plastic tonight.

## 2012-06-25 NOTE — ED Provider Notes (Signed)
History     CSN: [id number]  Arrival date & time 06/25/12  0104   First MD Initiated Contact with Patient 06/25/12 0115      Chief Complaint  Patient presents with  . Laceration    (Consider location/radiation/quality/duration/timing/severity/associated sxs/prior treatment) HPI Comments: Cut hand using a box cutter to open a plastic container.    Patient is a 47 y.o. male presenting with skin laceration. The history is provided by the patient.  Laceration  The incident occurred 1 to 2 hours ago. The laceration is located on the right hand. The laceration is 2 cm in size. The laceration mechanism was a a razor. The pain is mild. The pain has been constant since onset. He reports no foreign bodies present. His tetanus status is out of date.    Past Medical History  Diagnosis Date  . Depression   . ADHD (attention deficit hyperactivity disorder)   . Irregular heart beat     History reviewed. No pertinent past surgical history.  No family history on file.  History  Substance Use Topics  . Smoking status: Current Every Day Smoker -- 0.5 packs/day  . Smokeless tobacco: Not on file  . Alcohol Use: Yes     Comment: weekends      Review of Systems  All other systems reviewed and are negative.    Allergies  Review of patient's allergies indicates no known allergies.  Home Medications   Current Outpatient Rx  Name  Route  Sig  Dispense  Refill  . PROMETHAZINE HCL 25 MG PO TABS   Oral   Take 0.5 tablets (12.5 mg total) by mouth every 6 (six) hours as needed for nausea.   10 tablet   0     BP 131/80  Pulse 103  Temp 98 F (36.7 C) (Oral)  Resp 18  Ht 5\' 11"  (1.803 m)  Wt 175 lb (79.379 kg)  BMI 24.41 kg/m2  SpO2 97%  Physical Exam  Nursing note and vitals reviewed. Constitutional: He is oriented to person, place, and time. He appears well-developed and well-nourished. No distress.  HENT:  Head: Normocephalic and atraumatic.  Mouth/Throat: Oropharynx is  clear and moist.  Neck: Normal range of motion. Neck supple.  Musculoskeletal:       The center of the palmar aspect of the right hand is noted to have a 2 cm laceration.  There is no tendon involvement, sensation is intact.  The wound was explored and no fb was found.  Neurological: He is alert and oriented to person, place, and time.  Skin: Skin is warm and dry. He is not diaphoretic.    ED Course  Procedures (including critical care time)  Labs Reviewed - No data to display No results found.   No diagnosis found.  LACERATION REPAIR Performed by: Geoffery Lyons Authorized by: Geoffery Lyons Consent: Verbal consent obtained. Risks and benefits: risks, benefits and alternatives were discussed Consent given by: patient Patient identity confirmed: provided demographic data Prepped and Draped in normal sterile fashion Wound explored  Laceration Location: right hand  Laceration Length: 2cm  No Foreign Bodies seen or palpated  Anesthesia: local infiltration  Local anesthetic: lidocaine 1% without epinephrine  Anesthetic total: 1 ml  Irrigation method: syringe Amount of cleaning: standard  Skin closure: 4-0 prolene  Number of sutures: 2  Technique: simple interrupted  Patient tolerance: Patient tolerated the procedure well with no immediate complications.   MDM  Laceration repaired as above.  No indication for antibiotics.  Local wound care, sutures out in one week.          Geoffery Lyons, MD 06/25/12 [phone]

## 2012-07-08 ENCOUNTER — Emergency Department (HOSPITAL_COMMUNITY): Payer: Medicaid Other

## 2012-07-08 ENCOUNTER — Encounter (HOSPITAL_COMMUNITY): Payer: Self-pay | Admitting: Emergency Medicine

## 2012-07-08 ENCOUNTER — Emergency Department (HOSPITAL_COMMUNITY)
Admission: EM | Admit: 2012-07-08 | Discharge: 2012-07-08 | Disposition: A | Discharge status: Home / Self Care | Payer: Medicaid Other | Attending: Emergency Medicine | Admitting: Emergency Medicine

## 2012-07-08 DIAGNOSIS — Z4802 Encounter for removal of sutures: Secondary | ICD-10-CM | POA: Insufficient documentation

## 2012-07-08 DIAGNOSIS — Y9389 Activity, other specified: Secondary | ICD-10-CM | POA: Insufficient documentation

## 2012-07-08 DIAGNOSIS — F17200 Nicotine dependence, unspecified, uncomplicated: Secondary | ICD-10-CM | POA: Insufficient documentation

## 2012-07-08 DIAGNOSIS — Z8659 Personal history of other mental and behavioral disorders: Secondary | ICD-10-CM | POA: Insufficient documentation

## 2012-07-08 DIAGNOSIS — S61209A Unspecified open wound of unspecified finger without damage to nail, initial encounter: Secondary | ICD-10-CM | POA: Insufficient documentation

## 2012-07-08 DIAGNOSIS — Y9289 Other specified places as the place of occurrence of the external cause: Secondary | ICD-10-CM | POA: Insufficient documentation

## 2012-07-08 DIAGNOSIS — S61218A Laceration without foreign body of other finger without damage to nail, initial encounter: Secondary | ICD-10-CM

## 2012-07-08 DIAGNOSIS — Z8679 Personal history of other diseases of the circulatory system: Secondary | ICD-10-CM | POA: Insufficient documentation

## 2012-07-08 DIAGNOSIS — W268XXA Contact with other sharp object(s), not elsewhere classified, initial encounter: Secondary | ICD-10-CM | POA: Insufficient documentation

## 2012-07-08 MED ORDER — LIDOCAINE HCL (PF) 2 % IJ SOLN
2.0000 mL | Freq: Once | INTRAMUSCULAR | Status: AC
  Administered 2012-07-08: 2 mL
  Filled 2012-07-08: qty 10

## 2012-07-08 NOTE — ED Notes (Signed)
Pt states he cut the rt middle finger on the fish tank.

## 2012-07-08 NOTE — ED Notes (Signed)
Pt presents with middle right finger laceration, bleeding controled. Pt states was arguing with girl friend and slammed a hammer into a fish tank, cutting finger. Pt had tetanus within the past 2 weeks. NAD noted. Pt is calm and cooperative.

## 2012-07-10 NOTE — ED Provider Notes (Signed)
History     CSN: [id number]  Arrival date & time 07/08/12  2027   First MD Initiated Contact with Patient 07/08/12 2101      Chief Complaint  Patient presents with  . Extremity Laceration    (Consider location/radiation/quality/duration/timing/severity/associated sxs/prior treatment) HPI Comments: Terrence Strickland is a 47 y.o. Male presenting with a laceration to his right dorsal 3rd finger he sustained when hitting a glass aquarium with a hammer just prior to arrival.  He reports pain directly at the site without radiation of pain and denies weakness or numbness in the finger.  He has used direct pressure and obtained some, but not complete resolution of bleeding.  He also mentions sutures obtained here last week on the palm of the same hand which he is desirous of having removed.  He denies any pain,  Redness,  Swelling and there has been no drainage from this site.  His tetanus is utd.      The history is provided by the patient.    Past Medical History  Diagnosis Date  . Depression   . ADHD (attention deficit hyperactivity disorder)   . Irregular heart beat     History reviewed. No pertinent past surgical history.  History reviewed. No pertinent family history.  History  Substance Use Topics  . Smoking status: Current Every Day Smoker -- 0.5 packs/day  . Smokeless tobacco: Not on file  . Alcohol Use: Yes     Comment: weekends      Review of Systems  Constitutional: Negative for fever and chills.  HENT: Negative for facial swelling.   Respiratory: Negative for shortness of breath and wheezing.   Skin: Positive for wound.  Neurological: Negative for numbness.    Allergies  Review of patient's allergies indicates no known allergies.  Home Medications   Current Outpatient Rx  Name  Route  Sig  Dispense  Refill  . HYDROCODONE-ACETAMINOPHEN 10-325 MG PO TABS   Oral   Take 1 tablet by mouth every 6 (six) hours as needed. For pain           BP 150/95  Pulse  81  Temp 99.2 F (37.3 C) (Oral)  Resp 18  Ht 5\' 11"  (1.803 m)  Wt 180 lb (81.647 kg)  BMI 25.10 kg/m2  SpO2 96%  Physical Exam  Constitutional: He is oriented to person, place, and time. He appears well-developed and well-nourished.  HENT:  Head: Normocephalic.  Cardiovascular: Normal rate.   Pulmonary/Chest: Effort normal.  Neurological: He is alert and oriented to person, place, and time. No sensory deficit.  Skin: Laceration noted.       2 cm laceration right proximal dorsal phalanx of 3rd digit,  No involving joint space,  Hemostatic,  Irregular.  Also,  There is a well healed laceration palmar side of same hand. Less than 3 sec cap refill.    ED Course  SUTURE REMOVAL Date/Time: 07/08/2012 9:20 PM Performed by: Burgess Amor Authorized by: Burgess Amor Consent: Verbal consent obtained. Risks and benefits: risks, benefits and alternatives were discussed Consent given by: patient Patient understanding: patient states understanding of the procedure being performed Patient identity confirmed: verbally with patient Time out: Immediately prior to procedure a "time out" was called to verify the correct patient, procedure, equipment, support staff and site/side marked as required. Body area: upper extremity Location details: right hand Wound Appearance: clean Sutures Removed: 2 Post-removal: dressing applied Facility: sutures placed in this facility Patient tolerance: Patient tolerated the procedure well  with no immediate complications.   (including critical care time)   LACERATION REPAIR Performed by: Burgess Amor Authorized by: Burgess Amor Consent: Verbal consent obtained. Risks and benefits: risks, benefits and alternatives were discussed Consent given by: patient Patient identity confirmed: provided demographic data Prepped and Draped in normal sterile fashion Wound explored  Laceration Location: right 3rd dorsal finger  Laceration Length: 2cm  No Foreign Bodies  seen or palpated  Anesthesia: digital block  Local anesthetic: lidocaine 2% without epinephrine  Anesthetic total: 2 ml  Irrigation method: syringe Amount of cleaning: copious Skin closure: ethilon 4-0  Number of sutures: 4  Technique: simple interrupted  Patient tolerance: Patient tolerated the procedure well with no immediate complications.      Labs Reviewed - No data to display Dg Finger Middle Right  07/08/2012  *RADIOLOGY REPORT*  Clinical Data: Extremity laceration.  Assess for foreign body.  Cut on fish tank.  RIGHT MIDDLE FINGER 2+V  Comparison: None.  Findings: There is soft tissue irregularity along the post aspect of the digit at the level of the proximal interphalangeal joint. No evidence for fracture or radiopaque foreign body.  IMPRESSION: Soft tissue laceration.   Original Report Authenticated By: Norva Pavlov, M.D.      1. Laceration of finger, middle   2. Visit for suture removal       MDM  Laceration repair.  Patients labs and/or radiological studies were reviewed during the medical decision making and disposition process. No evidence of fb.  No indictation for abx. Pt is utd on tetanus. Suture removal in 10 days,  Advised of s/s of infection,  Close f/u prn.        Burgess Amor, PA 07/10/12 1414

## 2012-07-11 ENCOUNTER — Encounter (HOSPITAL_COMMUNITY): Payer: Self-pay | Admitting: Emergency Medicine

## 2012-07-11 ENCOUNTER — Emergency Department (HOSPITAL_COMMUNITY)
Admission: EM | Admit: 2012-07-11 | Discharge: 2012-07-11 | Disposition: A | Discharge status: Home / Self Care | Payer: Medicaid Other | Attending: Emergency Medicine | Admitting: Emergency Medicine

## 2012-07-11 ENCOUNTER — Emergency Department (HOSPITAL_COMMUNITY): Payer: Medicaid Other

## 2012-07-11 DIAGNOSIS — R079 Chest pain, unspecified: Secondary | ICD-10-CM

## 2012-07-11 DIAGNOSIS — Z8679 Personal history of other diseases of the circulatory system: Secondary | ICD-10-CM | POA: Insufficient documentation

## 2012-07-11 DIAGNOSIS — G479 Sleep disorder, unspecified: Secondary | ICD-10-CM | POA: Insufficient documentation

## 2012-07-11 DIAGNOSIS — Z8659 Personal history of other mental and behavioral disorders: Secondary | ICD-10-CM | POA: Insufficient documentation

## 2012-07-11 DIAGNOSIS — R0602 Shortness of breath: Secondary | ICD-10-CM | POA: Insufficient documentation

## 2012-07-11 DIAGNOSIS — F1010 Alcohol abuse, uncomplicated: Secondary | ICD-10-CM | POA: Insufficient documentation

## 2012-07-11 DIAGNOSIS — F17200 Nicotine dependence, unspecified, uncomplicated: Secondary | ICD-10-CM | POA: Insufficient documentation

## 2012-07-11 LAB — BASIC METABOLIC PANEL
BUN: 12 mg/dL (ref 6–23)
CO2: 26 mEq/L (ref 19–32)
Chloride: 96 mEq/L (ref 96–112)
GFR calc non Af Amer: 78 mL/min — ABNORMAL LOW (ref >90)
Glucose, Bld: 108 mg/dL — ABNORMAL HIGH (ref 70–99)
Potassium: 3.1 mEq/L — ABNORMAL LOW (ref 3.5–5.1)

## 2012-07-11 NOTE — ED Provider Notes (Signed)
History     CSN: [id number]  Arrival date & time 07/11/12  0140   First MD Initiated Contact with Patient 07/11/12 [phone]      Chief Complaint  Patient presents with  . Chest Pain    (Consider location/radiation/quality/duration/timing/severity/associated sxs/prior treatment) HPI Terrence Strickland is a 48 y.o. male who was brought into the ER by ambulance with a c/o chest pain that began while he was drinking earlier tonight. He became short or breath. The episode has abated with no intervention.   PCP Dr. Renard Matter Past Medical History  Diagnosis Date  . Depression   . ADHD (attention deficit hyperactivity disorder)   . Irregular heart beat     History reviewed. No pertinent past surgical history.  History reviewed. No pertinent family history.  History  Substance Use Topics  . Smoking status: Current Every Day Smoker -- 0.5 packs/day  . Smokeless tobacco: Not on file  . Alcohol Use: Yes     Comment: weekends      Review of Systems  Constitutional: Negative for fever.       10 Systems reviewed and are negative for acute change except as noted in the HPI.  HENT: Negative for congestion.   Eyes: Negative for discharge and redness.  Respiratory: Negative for cough and shortness of breath.   Cardiovascular: Positive for chest pain.  Gastrointestinal: Negative for vomiting and abdominal pain.  Musculoskeletal: Negative for back pain.  Skin: Negative for rash.  Neurological: Negative for syncope, numbness and headaches.  Psychiatric/Behavioral:       No behavior change.    Allergies  Review of patient's allergies indicates no known allergies.  Home Medications   Current Outpatient Rx  Name  Route  Sig  Dispense  Refill  . HYDROCODONE-ACETAMINOPHEN 10-325 MG PO TABS   Oral   Take 1 tablet by mouth every 6 (six) hours as needed. For pain           BP 131/78  Pulse 59  Temp 97.7 F (36.5 C) (Oral)  Resp 16  Ht 5\' 11"  (1.803 m)  Wt 175 lb (79.379 kg)  BMI  24.41 kg/m2  SpO2 99%  Physical Exam  Nursing note and vitals reviewed. Constitutional:       Awake, alert, nontoxic appearance.  HENT:  Head: Atraumatic.  Eyes: Right eye exhibits no discharge. Left eye exhibits no discharge.  Neck: Neck supple.  Cardiovascular: Normal rate.   Pulmonary/Chest: Effort normal and breath sounds normal. He exhibits no tenderness.  Abdominal: Soft. There is no tenderness. There is no rebound.  Musculoskeletal: He exhibits no tenderness.       Baseline ROM, no obvious new focal weakness.  Neurological:       Mental status and motor strength appears baseline for patient and situation.  Skin: No rash noted.  Psychiatric: He has a normal mood and affect.    ED Course  Procedures (including critical care time)  Results for orders placed during the hospital encounter of 07/11/12  TROPONIN I      Component Value Range   Troponin I <0.30  <0.30 ng/mL  BASIC METABOLIC PANEL      Component Value Range   Sodium 135  135 - 145 mEq/L   Potassium 3.1 (*) 3.5 - 5.1 mEq/L   Chloride 96  96 - 112 mEq/L   CO2 26  19 - 32 mEq/L   Glucose, Bld 108 (*) 70 - 99 mg/dL   BUN 12  6 - 23  mg/dL   Creatinine, Ser 9.60  0.50 - 1.35 mg/dL   Calcium 9.2  8.4 - 45.4 mg/dL   GFR calc non Af Amer 78 (*) >90 mL/min   GFR calc Af Amer >90  >90 mL/min    Date: 07/11/2012  0140  Rate: 58  Rhythm: sinus bradycardia  QRS Axis: normal  Intervals: normal  ST/T Wave abnormalities: normal  Conduction Disutrbances:none  Narrative Interpretation:   Old EKG Reviewed: unchanged c/w 05/15/11    MDM  Patient with chest pain and shortness of breath that developed while he was drinking. Pain and feeling abated without care. He has been pain free since arrival. He has taken PO meal and drink. Labs are unremarkable. Xray is normal. Reviewed findings with patient. Pt stable in ED with no significant deterioration in condition.The patient appears reasonably screened and/or stabilized for  discharge and I doubt any other medical condition or other [facility] requiring further screening, evaluation, or treatment in the ED at this time prior to discharge.  MDM Reviewed: nursing note and vitals Interpretation: labs, ECG and x-ray           Nicoletta Dress. Colon Branch, MD 07/11/12 [phone]

## 2012-07-11 NOTE — ED Notes (Signed)
Patient complaining of chest pain with shortness of breath starting approximately 1 hour ago. Admits to drinking 3 beers tonight.

## 2012-07-11 NOTE — ED Notes (Signed)
Patient stated that the chest pain was so bad he thought he was going to die

## 2012-07-11 NOTE — ED Notes (Signed)
Gave patient meal tray and drink as requested and approved by MD. Patient sitting upright in bed eating at this time. Family at bedside.

## 2012-07-13 NOTE — ED Provider Notes (Signed)
Medical screening examination/treatment/procedure(s) were performed by non-physician practitioner and as supervising physician I was immediately available for consultation/collaboration.  Raeford Razor, MD 07/13/12 0130

## 2012-07-22 ENCOUNTER — Encounter (HOSPITAL_COMMUNITY): Payer: Self-pay | Admitting: *Deleted

## 2012-07-22 ENCOUNTER — Emergency Department (HOSPITAL_COMMUNITY)
Admission: EM | Admit: 2012-07-22 | Discharge: 2012-07-22 | Disposition: A | Discharge status: Home / Self Care | Payer: Medicaid Other | Attending: Emergency Medicine | Admitting: Emergency Medicine

## 2012-07-22 DIAGNOSIS — Z4802 Encounter for removal of sutures: Secondary | ICD-10-CM

## 2012-07-22 DIAGNOSIS — Z8659 Personal history of other mental and behavioral disorders: Secondary | ICD-10-CM | POA: Insufficient documentation

## 2012-07-22 DIAGNOSIS — F17200 Nicotine dependence, unspecified, uncomplicated: Secondary | ICD-10-CM | POA: Insufficient documentation

## 2012-07-22 MED ORDER — BACITRACIN ZINC 500 UNIT/GM EX OINT
TOPICAL_OINTMENT | CUTANEOUS | Status: AC
  Administered 2012-07-22: 1
  Filled 2012-07-22: qty 0.9

## 2012-07-22 NOTE — ED Notes (Signed)
Here for suture removal to [facility]

## 2012-07-22 NOTE — ED Provider Notes (Signed)
History     CSN: [id number]  Arrival date & time 07/22/12  1612   First MD Initiated Contact with Patient 07/22/12 1619      Chief Complaint  Patient presents with  . Suture / Staple Removal    HPI Terrence Strickland is a 47 y.o. male who presents to the ED for suture removal. The sutures were placed in the right middle finger over 10 days ago. Patient denies any problems other than itching in the area of the sutures. The history was provided by the patient.  Past Medical History  Diagnosis Date  . Depression   . ADHD (attention deficit hyperactivity disorder)   . Irregular heart beat     History reviewed. No pertinent past surgical history.  History reviewed. No pertinent family history.  History  Substance Use Topics  . Smoking status: Current Every Day Smoker -- 0.50 packs/day  . Smokeless tobacco: Not on file  . Alcohol Use: Yes     Comment: weekends      Review of Systems  HENT: Negative for neck pain.   Respiratory: Negative for cough.   Cardiovascular: Negative for chest pain.  Gastrointestinal: Negative for abdominal pain.  Musculoskeletal:       Laceration with sutures to right middle finger.  Skin: Positive for wound.  Neurological: Negative for headaches.  Psychiatric/Behavioral: Negative for confusion.    Allergies  Review of patient's allergies indicates no known allergies.  Home Medications   Current Outpatient Rx  Name  Route  Sig  Dispense  Refill  . HYDROcodone-acetaminophen (NORCO) 10-325 MG per tablet   Oral   Take 1 tablet by mouth every 6 (six) hours as needed. For pain           There were no vitals taken for this visit.  Physical Exam  Nursing note and vitals reviewed. Constitutional: He is oriented to person, place, and time. He appears well-developed and well-nourished. No distress.  HENT:  Head: Normocephalic and atraumatic.  Eyes: EOM are normal.  Neck: Neck supple.  Cardiovascular: Normal rate.   Pulmonary/Chest: Effort  normal.  Musculoskeletal:  Healing laceration right middle finger. Sutures in place. No signs of infection.  Neurological: He is alert and oriented to person, place, and time. No cranial nerve deficit.  Skin: Skin is warm and dry.  Psychiatric: He has a normal mood and affect. His behavior is normal. Judgment and thought content normal.   Procedures Assessment: 47 y.o. male here for wound check and suture removal  Plan:  Sutures removed, wound cleaned, bacitracin ointment and dressing   Return as needed.  Discussed with the patient and all questioned fully answered.   [place], [place] 07/22/12 [phone]

## 2012-07-22 NOTE — ED Notes (Signed)
Suture removal from [facility]. Well healed. Bacitracin, bandaid.

## 2012-07-25 ENCOUNTER — Emergency Department (HOSPITAL_COMMUNITY)
Admission: EM | Admit: 2012-07-25 | Discharge: 2012-07-25 | Disposition: A | Discharge status: Home / Self Care | Payer: Medicaid Other | Attending: Emergency Medicine | Admitting: Emergency Medicine

## 2012-07-25 ENCOUNTER — Encounter (HOSPITAL_COMMUNITY): Payer: Self-pay | Admitting: *Deleted

## 2012-07-25 DIAGNOSIS — Y9389 Activity, other specified: Secondary | ICD-10-CM | POA: Insufficient documentation

## 2012-07-25 DIAGNOSIS — S61409A Unspecified open wound of unspecified hand, initial encounter: Secondary | ICD-10-CM | POA: Insufficient documentation

## 2012-07-25 DIAGNOSIS — Y929 Unspecified place or not applicable: Secondary | ICD-10-CM | POA: Insufficient documentation

## 2012-07-25 DIAGNOSIS — W292XXA Contact with other powered household machinery, initial encounter: Secondary | ICD-10-CM | POA: Insufficient documentation

## 2012-07-25 DIAGNOSIS — Z8679 Personal history of other diseases of the circulatory system: Secondary | ICD-10-CM | POA: Insufficient documentation

## 2012-07-25 DIAGNOSIS — F17200 Nicotine dependence, unspecified, uncomplicated: Secondary | ICD-10-CM | POA: Insufficient documentation

## 2012-07-25 DIAGNOSIS — Z8659 Personal history of other mental and behavioral disorders: Secondary | ICD-10-CM | POA: Insufficient documentation

## 2012-07-25 MED ORDER — ACETAMINOPHEN 325 MG PO TABS
650.0000 mg | ORAL_TABLET | Freq: Once | ORAL | Status: AC
  Administered 2012-07-25: 650 mg via ORAL
  Filled 2012-07-25: qty 2

## 2012-07-25 NOTE — ED Notes (Signed)
Bleeding controlled at present. 

## 2012-07-25 NOTE — ED Notes (Signed)
Ice pack applied on top of pressure dressing.

## 2012-07-25 NOTE — ED Provider Notes (Signed)
History     CSN: [id number]  Arrival date & time 07/25/12  0405   First MD Initiated Contact with Patient 07/25/12 0448      Chief Complaint  Patient presents with  . Laceration    (Consider location/radiation/quality/duration/timing/severity/associated sxs/prior treatment) HPI Terrence Strickland is a 47 y.o. male who presents to the Emergency Department complaining of hand laceration. He will not tell us how it happened. Superficial laceration to the top of his right hand. Tetanus is current.   PCP Dr. Renard Matter  Past Medical History  Diagnosis Date  . Depression   . ADHD (attention deficit hyperactivity disorder)   . Irregular heart beat     History reviewed. No pertinent past surgical history.  History reviewed. No pertinent family history.  History  Substance Use Topics  . Smoking status: Current Every Day Smoker -- 0.50 packs/day  . Smokeless tobacco: Not on file  . Alcohol Use: Yes     Comment: weekends      Review of Systems  Constitutional: Negative for fever.       10 Systems reviewed and are negative for acute change except as noted in the HPI.  HENT: Negative for congestion.   Eyes: Negative for discharge and redness.  Respiratory: Negative for cough and shortness of breath.   Cardiovascular: Negative for chest pain.  Gastrointestinal: Negative for vomiting and abdominal pain.  Musculoskeletal: Negative for back pain.  Skin: Negative for rash.       Laceration to right hand  Neurological: Negative for syncope, numbness and headaches.  Psychiatric/Behavioral:       No behavior change.    Allergies  Review of patient's allergies indicates no known allergies.  Home Medications   Current Outpatient Rx  Name  Route  Sig  Dispense  Refill  . HYDROcodone-acetaminophen (NORCO) 10-325 MG per tablet   Oral   Take 1 tablet by mouth every 6 (six) hours as needed. For pain           BP 150/101  Pulse 107  Temp(Src) 98.8 F (37.1 C)  Resp 18  Ht 5\' 11"   (1.803 m)  Wt 170 lb (77.111 kg)  BMI 23.72 kg/m2  SpO2 97%  Physical Exam  Nursing note and vitals reviewed. Constitutional:  Awake, alert, nontoxic appearance.  HENT:  Head: Atraumatic.  Eyes: Right eye exhibits no discharge. Left eye exhibits no discharge.  Neck: Neck supple.  Pulmonary/Chest: Effort normal. He exhibits no tenderness.  Abdominal: Soft. There is no tenderness. There is no rebound.  Musculoskeletal: He exhibits no tenderness.  Baseline ROM, no obvious new focal weakness.Superficial laceration diagonally across the dorsum of his right hand. Bleeding controlled.  Neurological:  Mental status and motor strength appears baseline for patient and situation.  Skin: No rash noted.  Psychiatric: He has a normal mood and affect.    ED Course  Procedures (including critical care time) LACERATION REPAIR Performed by: Annamarie Dawley. Authorized by: Annamarie Dawley Consent: Verbal consent obtained. Risks and benefits: risks, benefits and alternatives were discussed Consent given by: patient Patient identity confirmed: provided demographic data Prepped and Draped in normal sterile fashion Wound explored Laceration Location: right hand Laceration Length: 8 cm No Foreign Bodies seen or palpated Irrigation method: syringe Amount of cleaning: standard Skin closure: steri strips Number of sutures: 8 strips Patient tolerance: Patient tolerated the procedure well with no immediate complications. 6213 Patient given tylenol.   MDM  Patient with diagonal superficial laceration to dorsum of right hand. Wound  cleaned and steri stripped together. Pt stable in ED with no significant deterioration in condition.The patient appears reasonably screened and/or stabilized for discharge and I doubt any other medical condition or other [facility] requiring further screening, evaluation, or treatment in the ED at this time prior to discharge.  MDM Reviewed: nursing note and  vitals           Nicoletta Dress. Colon Branch, MD 07/25/12 [phone]
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
## 2012-07-25 NOTE — ED Provider Notes (Signed)
History/physical exam/procedure(s) were performed by non-physician practitioner and as supervising physician I was immediately available for consultation/collaboration. I have reviewed all notes and am in agreement with care and plan.   Hilario Quarry, MD 07/25/12 [phone]

## 2012-07-26 ENCOUNTER — Emergency Department (HOSPITAL_COMMUNITY)
Admission: EM | Admit: 2012-07-26 | Discharge: 2012-07-26 | Disposition: A | Discharge status: Home / Self Care | Payer: Medicaid Other | Attending: Emergency Medicine | Admitting: Emergency Medicine

## 2012-07-26 DIAGNOSIS — Z4801 Encounter for change or removal of surgical wound dressing: Secondary | ICD-10-CM | POA: Insufficient documentation

## 2012-07-26 DIAGNOSIS — Z8659 Personal history of other mental and behavioral disorders: Secondary | ICD-10-CM | POA: Insufficient documentation

## 2012-07-26 DIAGNOSIS — F17200 Nicotine dependence, unspecified, uncomplicated: Secondary | ICD-10-CM | POA: Insufficient documentation

## 2012-07-26 DIAGNOSIS — Z8679 Personal history of other diseases of the circulatory system: Secondary | ICD-10-CM | POA: Insufficient documentation

## 2012-07-26 NOTE — ED Notes (Signed)
Pt here wanting hand re-wrapped. Pt has a long, healing laceration with steri strips on it .

## 2012-07-26 NOTE — ED Notes (Signed)
Burgess Amor PA in room at this time, she has re wrapped it with guaze. Pt sent home with supplies to do a couple more dressing changes.

## 2012-07-26 NOTE — ED Notes (Addendum)
Pt has a long laceration to his left hand. Was seen previously and laveration was steri stripped. Currently laceration is steristripped, no obvious signs of infection. Pt states he would like it to be re wrapped. Pt aax4 NAD .

## 2012-07-30 NOTE — ED Provider Notes (Signed)
History     CSN: [id number]  Arrival date & time 07/26/12  2155   First MD Initiated Contact with Patient 07/26/12 2223      Chief Complaint  Patient presents with  . Wound Check    (Consider location/radiation/quality/duration/timing/severity/associated sxs/prior treatment) Patient is a 47 y.o. male presenting with wound check. The history is provided by the patient.  Wound Check This is a new problem. The current episode started yesterday. Progression since onset: His dressing got wet and he is desirous of dressing change. Pertinent negatives include no chills, fever, joint swelling, nausea, numbness or weakness. He has tried nothing for the symptoms.    Past Medical History  Diagnosis Date  . Depression   . ADHD (attention deficit hyperactivity disorder)   . Irregular heart beat     No past surgical history on file.  No family history on file.  History  Substance Use Topics  . Smoking status: Current Every Day Smoker -- 0.50 packs/day  . Smokeless tobacco: Not on file  . Alcohol Use: Yes     Comment: weekends      Review of Systems  Constitutional: Negative for fever and chills.  HENT: Negative for facial swelling.   Respiratory: Negative for shortness of breath and wheezing.   Gastrointestinal: Negative for nausea.  Musculoskeletal: Negative for joint swelling.  Skin: Positive for wound.  Neurological: Negative for weakness and numbness.    Allergies  Review of patient's allergies indicates not on file.  Home Medications   Current Outpatient Rx  Name  Route  Sig  Dispense  Refill  . HYDROcodone-acetaminophen (NORCO) 10-325 MG per tablet   Oral   Take 1 tablet by mouth every 6 (six) hours as needed. For pain           BP 126/74  Pulse 75  Temp(Src) 97.9 F (36.6 C) (Oral)  Resp 20  Ht 5\' 11"  (1.803 m)  Wt 170 lb (77.111 kg)  BMI 23.72 kg/m2  SpO2 100%  Physical Exam  Constitutional: He is oriented to person, place, and time. He appears  well-developed and well-nourished.  HENT:  Head: Normocephalic.  Cardiovascular: Normal rate.   Pulmonary/Chest: Effort normal.  Musculoskeletal: He exhibits tenderness.  Neurological: He is alert and oriented to person, place, and time. No sensory deficit.  Skin: Laceration noted.  Well healing laceration posterior right hand,  Steri strips in place, edges well approximated,  No edema, erythema or drainage.    ED Course  Procedures (including critical care time)  Labs Reviewed - No data to display No results found.   1. Wound check, dressing change     Wound was dressed with telfa sponges,  Hand wrapped with cling.  MDM  Prn f/u        Burgess Amor, PA 07/30/12 1232

## 2012-07-30 NOTE — ED Provider Notes (Signed)
Medical screening examination/treatment/procedure(s) were performed by non-physician practitioner and as supervising physician I was immediately available for consultation/collaboration.   Alyss Granato L Imaya Duffy, MD 07/30/12 1343 

## 2012-09-19 ENCOUNTER — Ambulatory Visit (HOSPITAL_COMMUNITY)
Admission: RE | Admit: 2012-09-19 | Discharge: 2012-09-19 | Disposition: A | Discharge status: Home / Self Care | Payer: Medicaid Other | Source: Ambulatory Visit | Attending: Family Medicine | Admitting: Family Medicine

## 2012-09-19 ENCOUNTER — Other Ambulatory Visit (HOSPITAL_COMMUNITY): Payer: Self-pay | Admitting: Family Medicine

## 2012-09-19 DIAGNOSIS — M545 Low back pain, unspecified: Secondary | ICD-10-CM | POA: Insufficient documentation

## 2012-09-19 DIAGNOSIS — M47817 Spondylosis without myelopathy or radiculopathy, lumbosacral region: Secondary | ICD-10-CM | POA: Insufficient documentation

## 2013-03-07 ENCOUNTER — Encounter (HOSPITAL_COMMUNITY): Payer: Self-pay | Admitting: *Deleted

## 2013-03-07 ENCOUNTER — Emergency Department (HOSPITAL_COMMUNITY)
Admission: EM | Admit: 2013-03-07 | Discharge: 2013-03-07 | Disposition: A | Discharge status: Home / Self Care | Payer: Medicaid Other | Attending: Emergency Medicine | Admitting: Emergency Medicine

## 2013-03-07 DIAGNOSIS — M545 Low back pain, unspecified: Secondary | ICD-10-CM | POA: Insufficient documentation

## 2013-03-07 DIAGNOSIS — Z8679 Personal history of other diseases of the circulatory system: Secondary | ICD-10-CM | POA: Insufficient documentation

## 2013-03-07 DIAGNOSIS — R52 Pain, unspecified: Secondary | ICD-10-CM | POA: Insufficient documentation

## 2013-03-07 DIAGNOSIS — G8929 Other chronic pain: Secondary | ICD-10-CM | POA: Insufficient documentation

## 2013-03-07 DIAGNOSIS — F17200 Nicotine dependence, unspecified, uncomplicated: Secondary | ICD-10-CM | POA: Insufficient documentation

## 2013-03-07 DIAGNOSIS — Z8659 Personal history of other mental and behavioral disorders: Secondary | ICD-10-CM | POA: Insufficient documentation

## 2013-03-07 MED ORDER — HYDROCODONE-ACETAMINOPHEN 5-325 MG PO TABS: 1.0000 | ORAL_TABLET | ORAL | Status: DC | PRN

## 2013-03-07 NOTE — ED Notes (Addendum)
Ran out of Norco 10/325 used to treat for chronic back pain.  Lower back pain has been ongoing since age 47.  Has been seeing Dr. Megan Mans but is in process of changing MDs.  Does not have new one lined up.

## 2013-03-07 NOTE — ED Provider Notes (Signed)
CSN: [id number]     Arrival date & time 03/07/13  1534 History   First MD Initiated Contact with Patient 03/07/13 1609     Chief Complaint  Patient presents with  . Back Pain   (Consider location/radiation/quality/duration/timing/severity/associated sxs/prior Treatment) HPI Comments: Terrence Strickland is a 47 y.o. Male presenting with acute on chronic low back pain which is not new or different from his chronic daily back pain and he denies any new injury specifically.  He is currently waiting to establish care with a new physician, stating he has been trying to get referred to a back specialist and his pcp has been resistant to this, therefore he wants a new doctor.  He is scheduled to see Dr. Janna Arch but his appt is not until mid November.  In the interim,  He has ran out of his norco.  He denies weakness or numbness in the lower extremities and no urinary or bowel retention or incontinence.  Patient does not have a history of cancer or IVDU.        The history is provided by the patient.    Past Medical History  Diagnosis Date  . Depression   . ADHD (attention deficit hyperactivity disorder)   . Irregular heart beat    History reviewed. No pertinent past surgical history. History reviewed. No pertinent family history. History  Substance Use Topics  . Smoking status: Current Every Day Smoker -- 0.50 packs/day  . Smokeless tobacco: Not on file  . Alcohol Use: Yes     Comment: weekends    Review of Systems  Constitutional: Negative for fever.  Respiratory: Negative for shortness of breath.   Cardiovascular: Negative for chest pain and leg swelling.  Gastrointestinal: Negative for abdominal pain, constipation and abdominal distention.  Genitourinary: Negative for dysuria, urgency, frequency, flank pain and difficulty urinating.  Musculoskeletal: Positive for back pain. Negative for joint swelling and gait problem.  Skin: Negative for rash.  Neurological: Negative for weakness and  numbness.    Allergies  Review of patient's allergies indicates no known allergies.  Home Medications   Current Outpatient Rx  Name  Route  Sig  Dispense  Refill  . HYDROcodone-acetaminophen (NORCO) 10-325 MG per tablet   Oral   Take 1 tablet by mouth every 6 (six) hours as needed. For pain         . HYDROcodone-acetaminophen (NORCO/VICODIN) 5-325 MG per tablet   Oral   Take 1 tablet by mouth every 4 (four) hours as needed.   20 tablet   0    There were no vitals taken for this visit. Physical Exam  Nursing note and vitals reviewed. Constitutional: He appears well-developed and well-nourished.  HENT:  Head: Normocephalic.  Eyes: Conjunctivae are normal.  Neck: Normal range of motion. Neck supple.  Cardiovascular: Normal rate and intact distal pulses.   Pedal pulses normal.  Pulmonary/Chest: Effort normal.  Abdominal: Soft. Bowel sounds are normal. He exhibits no distension and no mass.  Musculoskeletal: Normal range of motion. He exhibits no edema.       Lumbar back: He exhibits tenderness. He exhibits no swelling, no edema and no spasm.  Neurological: He is alert. He has normal strength. He displays no atrophy and no tremor. No sensory deficit. Gait normal.  Reflex Scores:      Patellar reflexes are 2+ on the right side and 2+ on the left side.      Achilles reflexes are 2+ on the right side and 2+ on  the left side. No strength deficit noted in hip and knee flexor and extensor muscle groups.  Ankle flexion and extension intact.  Skin: Skin is warm and dry.  Psychiatric: He has a normal mood and affect.    ED Course  Procedures (including critical care time) Labs Review Labs Reviewed - No data to display Imaging Review No results found.  MDM   1. Lumbosacral pain    Chronic low back pain in patient with chronic narcotic use,  [place] database reviewed and patient has only received narcotics from his pcp as he has described.  He was advised the ed cannot treat his  chronic back pain and he would be best served by continuing to see his pcp until can establish with someone new as desired.  He was given small prescription of his norco,  To avoid withdrawal, and given it is Saturday,  Will be 2 days before can possibly see his pcp.  Advised he should not expect to get further narcotics from this ed for this problem.  Pt understands.    Burgess Amor, PA-C 03/07/13 2024

## 2013-03-08 NOTE — ED Provider Notes (Signed)
Medical screening examination/treatment/procedure(s) were performed by non-physician practitioner and as supervising physician I was immediately available for consultation/collaboration.  Delance Weide, MD 03/08/13 1518 

## 2013-03-18 ENCOUNTER — Encounter: Payer: Self-pay | Admitting: Orthopedic Surgery

## 2013-03-18 ENCOUNTER — Ambulatory Visit (INDEPENDENT_AMBULATORY_CARE_PROVIDER_SITE_OTHER): Payer: Medicaid Other | Admitting: Orthopedic Surgery

## 2013-03-18 VITALS — BP 157/106 | Ht 71.0 in | Wt 170.0 lb

## 2013-03-18 DIAGNOSIS — M47817 Spondylosis without myelopathy or radiculopathy, lumbosacral region: Secondary | ICD-10-CM

## 2013-03-18 DIAGNOSIS — M5126 Other intervertebral disc displacement, lumbar region: Secondary | ICD-10-CM

## 2013-03-18 MED ORDER — GABAPENTIN 100 MG PO CAPS: 100.0000 mg | ORAL_CAPSULE | Freq: Three times a day (TID) | ORAL | Status: DC

## 2013-03-18 MED ORDER — PREDNISONE 10 MG PO KIT: 10.0000 mg | PACK | ORAL | Status: DC

## 2013-03-18 NOTE — Progress Notes (Signed)
  Subjective:    Patient ID: Terrence Strickland, male    DOB: [birth year], 47 y.o.   MRN: [id number]  Back Pain This is a chronic problem. The current episode started more than 1 year ago (20 years). The problem occurs constantly. The problem has been waxing and waning since onset. The pain is present in the gluteal and lumbar spine. The quality of the pain is described as aching and shooting. The pain radiates to the right thigh, right knee and right foot. The pain is at a severity of 10/10. The pain is the same all the time. Associated symptoms include bladder incontinence and leg pain. Pertinent negatives include no perianal numbness.      Review of Systems  Genitourinary: Positive for bladder incontinence.  Musculoskeletal: Positive for back pain.  All other systems reviewed and are negative.       Objective:   Physical Exam Blood pressure 157/106, height 5\' 11"  (1.803 m), weight 170 lb (77.111 kg). General appearance is normal, the patient is alert and oriented x3 with normal mood and affect.  The patient's lower extremities have the following findings hip knee ankle range of motion stability strength normal no tenderness skin intact  Lumbar spine tender buttocks tender straight leg raise positive at 45 reproduces radicular symptoms in his right leg  Reflexes 2+ bilaterally normal  Sharp and soft touch sensation intact bilaterally       Assessment & Plan:  He's had a 5 view lumbar spine which shows a severe amount of spurring at what looks like L3 and 4 with disc space narrowing  He's had pain in his back with radicular symptoms for over 20 years worsening unrelieved by hydrocodone 7.5 and 5 mg  Recommend MRI to evaluate this for possible epidural steroids or neurosurgical referral  Meds ordered this encounter  Medications  . PredniSONE 10 MG KIT    Sig: Take 1 kit (10 mg total) by mouth as directed.    Dispense:  1 kit    Refill:  0  . gabapentin (NEURONTIN) 100 MG  capsule    Sig: Take 1 capsule (100 mg total) by mouth 3 (three) times daily.    Dispense:  90 capsule    Refill:  2

## 2013-03-18 NOTE — Patient Instructions (Signed)
MRI l spine  THIS WILL REQUIRE A PRE-CERTIFICATION FROM MEDICAID, PLEASE ALLOW 7-10 DAYS TO GET PRE-CERTIFICATION

## 2013-03-19 ENCOUNTER — Emergency Department (HOSPITAL_COMMUNITY)
Admission: EM | Admit: 2013-03-19 | Discharge: 2013-03-19 | Disposition: A | Discharge status: Home / Self Care | Payer: Medicaid Other | Attending: Emergency Medicine | Admitting: Emergency Medicine

## 2013-03-19 ENCOUNTER — Encounter (HOSPITAL_COMMUNITY): Payer: Self-pay | Admitting: Emergency Medicine

## 2013-03-19 DIAGNOSIS — M545 Low back pain, unspecified: Secondary | ICD-10-CM | POA: Insufficient documentation

## 2013-03-19 DIAGNOSIS — G8929 Other chronic pain: Secondary | ICD-10-CM | POA: Insufficient documentation

## 2013-03-19 DIAGNOSIS — Z8679 Personal history of other diseases of the circulatory system: Secondary | ICD-10-CM | POA: Insufficient documentation

## 2013-03-19 DIAGNOSIS — Z8659 Personal history of other mental and behavioral disorders: Secondary | ICD-10-CM | POA: Insufficient documentation

## 2013-03-19 DIAGNOSIS — F17200 Nicotine dependence, unspecified, uncomplicated: Secondary | ICD-10-CM | POA: Insufficient documentation

## 2013-03-19 DIAGNOSIS — R002 Palpitations: Secondary | ICD-10-CM | POA: Insufficient documentation

## 2013-03-19 DIAGNOSIS — Z79899 Other long term (current) drug therapy: Secondary | ICD-10-CM | POA: Insufficient documentation

## 2013-03-19 MED ORDER — DEXAMETHASONE 6 MG PO TABS: ORAL_TABLET | ORAL | Status: DC

## 2013-03-19 MED ORDER — MELOXICAM 15 MG PO TABS: 15.0000 mg | ORAL_TABLET | Freq: Every day | ORAL | Status: DC

## 2013-03-19 MED ORDER — METHOCARBAMOL 500 MG PO TABS: 500.0000 mg | ORAL_TABLET | Freq: Three times a day (TID) | ORAL | Status: DC

## 2013-03-19 MED ORDER — MELOXICAM 7.5 MG PO TABS: 15.0000 mg | ORAL_TABLET | Freq: Every day | ORAL | Status: DC

## 2013-03-19 NOTE — ED Notes (Signed)
Patient to ED c/o lower back pain 9/10 described as throbbing. Patient states that he previously has had a slipped disk and normally takes home medication for back pain. Patient states that he has run out of his pain medication and has not taken anything for "a couple of weeks". Patient A&0X4, patient in no apparent distress.

## 2013-03-19 NOTE — ED Provider Notes (Signed)
CSN: [id number]     Arrival date & time 03/19/13  1549 History   First MD Initiated Contact with Patient 03/19/13 1606     Chief Complaint  Patient presents with  . Back Pain   (Consider location/radiation/quality/duration/timing/severity/associated sxs/prior Treatment) Patient is a 47 y.o. male presenting with back pain. The history is provided by the patient.  Back Pain Location:  Lumbar spine Quality: throbbing. Radiates to:  Does not radiate Pain severity:  Moderate Pain is:  Same all the time Onset quality:  Gradual Duration: several years. Timing:  Intermittent Progression:  Worsening Chronicity:  Chronic Relieved by:  Nothing Worsened by:  Movement Associated symptoms: no abdominal pain, no bladder incontinence, no bowel incontinence, no chest pain, no dysuria, no numbness and no perianal numbness     Past Medical History  Diagnosis Date  . Depression   . ADHD (attention deficit hyperactivity disorder)   . Irregular heart beat    History reviewed. No pertinent past surgical history. Family History  Problem Relation Age of Onset  . Cancer    . Diabetes     History  Substance Use Topics  . Smoking status: Current Every Day Smoker -- 0.50 packs/day  . Smokeless tobacco: Not on file  . Alcohol Use: 1.2 oz/week    2 Cans of beer per week     Comment: weekends    Review of Systems  Constitutional: Negative for activity change.       All ROS Neg except as noted in HPI  HENT: Negative for nosebleeds.   Eyes: Negative for photophobia and discharge.  Respiratory: Negative for cough, shortness of breath and wheezing.   Cardiovascular: Positive for palpitations. Negative for chest pain.  Gastrointestinal: Negative for abdominal pain, blood in stool and bowel incontinence.  Genitourinary: Negative for bladder incontinence, dysuria, frequency and hematuria.  Musculoskeletal: Positive for back pain. Negative for arthralgias and neck pain.  Skin: Negative.     Neurological: Negative for dizziness, seizures, speech difficulty and numbness.  Psychiatric/Behavioral: Negative for hallucinations and confusion.       Depression    Allergies  Review of patient's allergies indicates no known allergies.  Home Medications   Current Outpatient Rx  Name  Route  Sig  Dispense  Refill  . gabapentin (NEURONTIN) 100 MG capsule   Oral   Take 1 capsule (100 mg total) by mouth 3 (three) times daily.   90 capsule   2   . HYDROcodone-acetaminophen (NORCO) 10-325 MG per tablet   Oral   Take 1 tablet by mouth every 6 (six) hours as needed. For pain         . HYDROcodone-acetaminophen (NORCO/VICODIN) 5-325 MG per tablet   Oral   Take 1 tablet by mouth every 4 (four) hours as needed.   20 tablet   0   . PredniSONE 10 MG KIT   Oral   Take 1 kit (10 mg total) by mouth as directed.   1 kit   0    BP 149/99  Pulse 94  Temp(Src) 98.8 F (37.1 C) (Oral)  Resp 18  Ht 5\' 11"  (1.803 m)  Wt 170 lb (77.111 kg)  BMI 23.72 kg/m2  SpO2 98% Physical Exam  Nursing note and vitals reviewed. Constitutional: He is oriented to person, place, and time. He appears well-developed and well-nourished.  Non-toxic appearance.  HENT:  Head: Normocephalic.  Right Ear: Tympanic membrane and external ear normal.  Left Ear: Tympanic membrane and external ear normal.  Eyes:  EOM and lids are normal. Pupils are equal, round, and reactive to light.  Neck: Normal range of motion. Neck supple. Carotid bruit is not present.  Cardiovascular: Normal rate, regular rhythm, normal heart sounds, intact distal pulses and normal pulses.   Pulmonary/Chest: Breath sounds normal. No respiratory distress.  Abdominal: Soft. Bowel sounds are normal. There is no tenderness. There is no guarding.  Musculoskeletal: Normal range of motion.       Arms: Lymphadenopathy:       Head (right side): No submandibular adenopathy present.       Head (left side): No submandibular adenopathy present.     He has no cervical adenopathy.  Neurological: He is alert and oriented to person, place, and time. He has normal strength. No cranial nerve deficit or sensory deficit.  Skin: Skin is warm and dry.  Psychiatric: He has a normal mood and affect. His speech is normal.    ED Course  Procedures (including critical care time) Labs Review Labs Reviewed - No data to display Imaging Review No results found.  EKG Interpretation   None       MDM  No diagnosis found. **I have reviewed nursing notes, vital signs, and all appropriate lab and imaging results for this patient.*  Discussed with patient the need to see a pcp for pain management. No acute problem or neuro deficit on ED exam today. Rx for mobic, robaxin, and decadron given to the patient.  Kathie Dike, PA-C 03/21/13 1401

## 2013-03-19 NOTE — ED Notes (Signed)
Patient to ED c/o lower back pain that he has had for years. Patient states that he is out of his medicine. Patient states 9/10 pain in his lower back described as throbbing. Patient states that he takes "hydrocodone 10's" but has not had any in the past few weeks due to running out of his medication. Patient alert and oriented X4. Patient in no apparent distress.

## 2013-03-23 NOTE — ED Provider Notes (Signed)
Medical screening examination/treatment/procedure(s) were performed by non-physician practitioner and as supervising physician I was immediately available for consultation/collaboration.   Shelda Jakes, MD 03/23/13 [phone]

## 2013-03-24 ENCOUNTER — Ambulatory Visit (HOSPITAL_COMMUNITY): Payer: Medicaid Other | Attending: Orthopedic Surgery

## 2013-03-25 ENCOUNTER — Emergency Department (HOSPITAL_COMMUNITY): Payer: Medicaid Other

## 2013-03-25 ENCOUNTER — Emergency Department (HOSPITAL_COMMUNITY)
Admission: EM | Admit: 2013-03-25 | Discharge: 2013-03-25 | Disposition: A | Discharge status: Home / Self Care | Payer: Medicaid Other | Attending: Emergency Medicine | Admitting: Emergency Medicine

## 2013-03-25 ENCOUNTER — Encounter (HOSPITAL_COMMUNITY): Payer: Self-pay | Admitting: Emergency Medicine

## 2013-03-25 DIAGNOSIS — Z8679 Personal history of other diseases of the circulatory system: Secondary | ICD-10-CM | POA: Insufficient documentation

## 2013-03-25 DIAGNOSIS — F329 Major depressive disorder, single episode, unspecified: Secondary | ICD-10-CM | POA: Insufficient documentation

## 2013-03-25 DIAGNOSIS — S0101XA Laceration without foreign body of scalp, initial encounter: Secondary | ICD-10-CM

## 2013-03-25 DIAGNOSIS — S0100XA Unspecified open wound of scalp, initial encounter: Secondary | ICD-10-CM | POA: Insufficient documentation

## 2013-03-25 DIAGNOSIS — IMO0002 Reserved for concepts with insufficient information to code with codable children: Secondary | ICD-10-CM | POA: Insufficient documentation

## 2013-03-25 DIAGNOSIS — S0990XA Unspecified injury of head, initial encounter: Secondary | ICD-10-CM | POA: Insufficient documentation

## 2013-03-25 DIAGNOSIS — Z791 Long term (current) use of non-steroidal anti-inflammatories (NSAID): Secondary | ICD-10-CM | POA: Insufficient documentation

## 2013-03-25 DIAGNOSIS — F3289 Other specified depressive episodes: Secondary | ICD-10-CM | POA: Insufficient documentation

## 2013-03-25 DIAGNOSIS — Z79899 Other long term (current) drug therapy: Secondary | ICD-10-CM | POA: Insufficient documentation

## 2013-03-25 DIAGNOSIS — F17200 Nicotine dependence, unspecified, uncomplicated: Secondary | ICD-10-CM | POA: Insufficient documentation

## 2013-03-25 MED ORDER — TRAMADOL HCL 50 MG PO TABS: 50.0000 mg | ORAL_TABLET | Freq: Four times a day (QID) | ORAL | Status: DC | PRN

## 2013-03-25 MED ORDER — TRAMADOL HCL 50 MG PO TABS
50.0000 mg | ORAL_TABLET | Freq: Once | ORAL | Status: AC
  Administered 2013-03-25: 50 mg via ORAL
  Filled 2013-03-25: qty 1

## 2013-03-25 MED ORDER — ACETAMINOPHEN 325 MG PO TABS
650.0000 mg | ORAL_TABLET | Freq: Once | ORAL | Status: AC
  Administered 2013-03-25: 650 mg via ORAL
  Filled 2013-03-25: qty 2

## 2013-03-25 MED ORDER — LIDOCAINE HCL (PF) 2 % IJ SOLN
10.0000 mL | Freq: Once | INTRAMUSCULAR | Status: DC
  Filled 2013-03-25: qty 10
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
## 2013-03-26 ENCOUNTER — Emergency Department (HOSPITAL_COMMUNITY)
Admission: EM | Admit: 2013-03-26 | Discharge: 2013-03-26 | Disposition: A | Discharge status: Home / Self Care | Payer: Medicaid Other | Attending: Emergency Medicine | Admitting: Emergency Medicine

## 2013-03-26 ENCOUNTER — Encounter (HOSPITAL_COMMUNITY): Payer: Self-pay | Admitting: Emergency Medicine

## 2013-03-26 DIAGNOSIS — F3289 Other specified depressive episodes: Secondary | ICD-10-CM | POA: Insufficient documentation

## 2013-03-26 DIAGNOSIS — G8911 Acute pain due to trauma: Secondary | ICD-10-CM | POA: Insufficient documentation

## 2013-03-26 DIAGNOSIS — IMO0002 Reserved for concepts with insufficient information to code with codable children: Secondary | ICD-10-CM | POA: Insufficient documentation

## 2013-03-26 DIAGNOSIS — R51 Headache: Secondary | ICD-10-CM | POA: Insufficient documentation

## 2013-03-26 DIAGNOSIS — F329 Major depressive disorder, single episode, unspecified: Secondary | ICD-10-CM | POA: Insufficient documentation

## 2013-03-26 DIAGNOSIS — Z8679 Personal history of other diseases of the circulatory system: Secondary | ICD-10-CM | POA: Insufficient documentation

## 2013-03-26 DIAGNOSIS — Z79899 Other long term (current) drug therapy: Secondary | ICD-10-CM | POA: Insufficient documentation

## 2013-03-26 DIAGNOSIS — Z791 Long term (current) use of non-steroidal anti-inflammatories (NSAID): Secondary | ICD-10-CM | POA: Insufficient documentation

## 2013-03-26 DIAGNOSIS — S40022A Contusion of left upper arm, initial encounter: Secondary | ICD-10-CM

## 2013-03-26 DIAGNOSIS — F17200 Nicotine dependence, unspecified, uncomplicated: Secondary | ICD-10-CM | POA: Insufficient documentation

## 2013-03-26 DIAGNOSIS — S40029A Contusion of unspecified upper arm, initial encounter: Secondary | ICD-10-CM | POA: Insufficient documentation

## 2013-03-26 NOTE — ED Provider Notes (Signed)
Medical screening examination/treatment/procedure(s) were performed by non-physician practitioner and as supervising physician I was immediately available for consultation/collaboration.  EKG Interpretation   None         Shelden Raborn L Reagan Behlke, MD 03/26/13 2035 
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
## 2013-03-30 ENCOUNTER — Ambulatory Visit: Payer: Medicaid Other | Admitting: Orthopedic Surgery

## 2013-04-04 ENCOUNTER — Emergency Department (HOSPITAL_COMMUNITY)
Admission: EM | Admit: 2013-04-04 | Discharge: 2013-04-04 | Disposition: A | Discharge status: Home / Self Care | Payer: Medicaid Other | Attending: Emergency Medicine | Admitting: Emergency Medicine

## 2013-04-04 ENCOUNTER — Encounter (HOSPITAL_COMMUNITY): Payer: Self-pay | Admitting: Emergency Medicine

## 2013-04-04 DIAGNOSIS — G8911 Acute pain due to trauma: Secondary | ICD-10-CM | POA: Insufficient documentation

## 2013-04-04 DIAGNOSIS — S060X0A Concussion without loss of consciousness, initial encounter: Secondary | ICD-10-CM

## 2013-04-04 DIAGNOSIS — Z8659 Personal history of other mental and behavioral disorders: Secondary | ICD-10-CM | POA: Insufficient documentation

## 2013-04-04 DIAGNOSIS — G8929 Other chronic pain: Secondary | ICD-10-CM

## 2013-04-04 DIAGNOSIS — R52 Pain, unspecified: Secondary | ICD-10-CM | POA: Insufficient documentation

## 2013-04-04 DIAGNOSIS — Z8679 Personal history of other diseases of the circulatory system: Secondary | ICD-10-CM | POA: Insufficient documentation

## 2013-04-04 DIAGNOSIS — F17200 Nicotine dependence, unspecified, uncomplicated: Secondary | ICD-10-CM | POA: Insufficient documentation

## 2013-04-04 DIAGNOSIS — R51 Headache: Secondary | ICD-10-CM | POA: Insufficient documentation

## 2013-04-04 DIAGNOSIS — Z4802 Encounter for removal of sutures: Secondary | ICD-10-CM | POA: Insufficient documentation

## 2013-04-04 MED ORDER — OXYCODONE-ACETAMINOPHEN 5-325 MG PO TABS
1.0000 | ORAL_TABLET | Freq: Once | ORAL | Status: AC
  Administered 2013-04-04: 1 via ORAL
  Filled 2013-04-04: qty 1

## 2013-04-04 MED ORDER — KETOROLAC TROMETHAMINE 60 MG/2ML IM SOLN
60.0000 mg | Freq: Once | INTRAMUSCULAR | Status: AC
  Administered 2013-04-04: 60 mg via INTRAMUSCULAR
  Filled 2013-04-04: qty 2

## 2013-04-04 NOTE — ED Notes (Signed)
Pt alert & oriented x4, stable gait. Patient given discharge instructions, paperwork & prescription(s). Patient  instructed to stop at the registration desk to finish any additional paperwork. Patient verbalized understanding. Pt left department w/ no further questions. 
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
## 2013-05-17 ENCOUNTER — Emergency Department (HOSPITAL_COMMUNITY): Payer: Medicaid Other

## 2013-05-17 ENCOUNTER — Encounter (HOSPITAL_COMMUNITY): Payer: Self-pay | Admitting: Emergency Medicine

## 2013-05-17 ENCOUNTER — Emergency Department (HOSPITAL_COMMUNITY)
Admission: EM | Admit: 2013-05-17 | Discharge: 2013-05-17 | Disposition: A | Discharge status: Home / Self Care | Payer: Medicaid Other | Attending: Emergency Medicine | Admitting: Emergency Medicine

## 2013-05-17 DIAGNOSIS — T7411XA Adult physical abuse, confirmed, initial encounter: Secondary | ICD-10-CM | POA: Insufficient documentation

## 2013-05-17 DIAGNOSIS — F17200 Nicotine dependence, unspecified, uncomplicated: Secondary | ICD-10-CM | POA: Insufficient documentation

## 2013-05-17 DIAGNOSIS — S41111A Laceration without foreign body of right upper arm, initial encounter: Secondary | ICD-10-CM

## 2013-05-17 DIAGNOSIS — S41109A Unspecified open wound of unspecified upper arm, initial encounter: Secondary | ICD-10-CM | POA: Insufficient documentation

## 2013-05-17 DIAGNOSIS — S21209A Unspecified open wound of unspecified back wall of thorax without penetration into thoracic cavity, initial encounter: Secondary | ICD-10-CM | POA: Insufficient documentation

## 2013-05-17 DIAGNOSIS — S21211A Laceration without foreign body of right back wall of thorax without penetration into thoracic cavity, initial encounter: Secondary | ICD-10-CM

## 2013-05-17 DIAGNOSIS — Z8679 Personal history of other diseases of the circulatory system: Secondary | ICD-10-CM | POA: Insufficient documentation

## 2013-05-17 DIAGNOSIS — S41009A Unspecified open wound of unspecified shoulder, initial encounter: Secondary | ICD-10-CM | POA: Insufficient documentation

## 2013-05-17 DIAGNOSIS — Z8659 Personal history of other mental and behavioral disorders: Secondary | ICD-10-CM | POA: Insufficient documentation

## 2013-05-17 MED ORDER — OXYCODONE HCL 5 MG PO TABS: 5.0000 mg | ORAL_TABLET | Freq: Four times a day (QID) | ORAL | Status: DC | PRN

## 2013-05-17 MED ORDER — OXYCODONE HCL 5 MG PO TABS
5.0000 mg | ORAL_TABLET | Freq: Once | ORAL | Status: AC
  Administered 2013-05-17: 5 mg via ORAL
  Filled 2013-05-17: qty 1

## 2013-05-17 NOTE — Discharge Instructions (Signed)
Delayed Wound Closure Sometimes, your health care provider will decide to delay closing a wound for several days. This is done when the wound is badly bruised, dirty, or when it has been several hours since the injury happened. By delaying the closure of your wound, the risk of infection is reduced. Wounds that are closed in 3 7 days after being cleaned up and dressed heal just as well as those that are closed right away. HOME CARE INSTRUCTIONS  Rest and elevate the injured area until the pain and swelling are gone.  Have your wound checked as instructed by your health care provider. SEEK MEDICAL CARE IF:  You develop unusual or increased swelling or redness around the wound.  You have increasing pain or tenderness.  There is increasing fluid (drainage) or a bad smelling drainage coming from the wound. Document Released: 05/21/2005 Document Revised: 01/21/2013 Document Reviewed: 11/18/2012 [facility] Patient Information 2014 [place], [place]. Stab Wound A stab wound can cause infection, bleeding and damage to organs and tissues in the area of the wound. Care must be taken for complete recovery. Much of the time stab wounds can be treated by cleaning them and applying a sterile dressing. Sutures, skin adhesive strips or staples may be used to close some stab wounds.  HOME CARE INSTRUCTIONS  Rest your injury for the next 2-3 days to reduce pain and lessen the risk of infection.  Keep your wound clean and dry and dress as instructed by your caregiver. You might need a tetanus shot now if:  You have no idea when you had the last one.  You have never had a tetanus shot before.  Your wound had dirt in it. If you need a tetanus shot, and you choose not to get one, there is a rare chance of getting tetanus. Sickness from tetanus can be serious. If you got a tetanus shot, your arm may swell, get red and warm to the touch at the shot site. This is common and not a problem. SEEK IMMEDIATE MEDICAL  CARE IF:  You develop increasing pain, redness or swelling around the wound.  You have nausea or vomiting.  There is increased bleeding from the wound.  You develop numbness or weakness in the injured area. This can be due to damage to an underlying nerve or tendon.  There is a pus-like discharge from the wound.  You have chills or an elevated temperature above 102 F (38.9 C).  You have shortness of breath or fainting. Document Released: 06/28/2004 Document Revised: 08/13/2011 Document Reviewed: 03/10/2008 [facility] Patient Information 2014 [place], [place].
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
## 2013-06-25 ENCOUNTER — Other Ambulatory Visit (HOSPITAL_COMMUNITY): Payer: Self-pay | Admitting: Family Medicine

## 2013-06-25 DIAGNOSIS — IMO0002 Reserved for concepts with insufficient information to code with codable children: Secondary | ICD-10-CM

## 2013-06-29 ENCOUNTER — Ambulatory Visit (HOSPITAL_COMMUNITY)
Admission: RE | Admit: 2013-06-29 | Discharge: 2013-06-29 | Disposition: A | Discharge status: Home / Self Care | Payer: Medicaid Other | Source: Ambulatory Visit | Attending: Family Medicine | Admitting: Family Medicine

## 2013-06-29 DIAGNOSIS — M519 Unspecified thoracic, thoracolumbar and lumbosacral intervertebral disc disorder: Secondary | ICD-10-CM | POA: Insufficient documentation

## 2013-06-29 DIAGNOSIS — M539 Dorsopathy, unspecified: Secondary | ICD-10-CM | POA: Insufficient documentation

## 2013-06-29 DIAGNOSIS — M545 Low back pain, unspecified: Secondary | ICD-10-CM | POA: Insufficient documentation

## 2013-06-29 DIAGNOSIS — IMO0002 Reserved for concepts with insufficient information to code with codable children: Secondary | ICD-10-CM

## 2013-06-29 DIAGNOSIS — M5126 Other intervertebral disc displacement, lumbar region: Secondary | ICD-10-CM | POA: Insufficient documentation

## 2013-06-29 DIAGNOSIS — Q762 Congenital spondylolisthesis: Secondary | ICD-10-CM | POA: Insufficient documentation

## 2013-07-09 ENCOUNTER — Emergency Department (HOSPITAL_COMMUNITY)
Admission: EM | Admit: 2013-07-09 | Discharge: 2013-07-10 | Disposition: A | Discharge status: Home / Self Care | Payer: Medicaid Other | Attending: Emergency Medicine | Admitting: Emergency Medicine

## 2013-07-09 ENCOUNTER — Encounter (HOSPITAL_COMMUNITY): Payer: Self-pay | Admitting: Emergency Medicine

## 2013-07-09 DIAGNOSIS — IMO0002 Reserved for concepts with insufficient information to code with codable children: Secondary | ICD-10-CM

## 2013-07-09 DIAGNOSIS — S61409A Unspecified open wound of unspecified hand, initial encounter: Secondary | ICD-10-CM | POA: Insufficient documentation

## 2013-07-09 DIAGNOSIS — Y9389 Activity, other specified: Secondary | ICD-10-CM | POA: Insufficient documentation

## 2013-07-09 DIAGNOSIS — T148XXA Other injury of unspecified body region, initial encounter: Secondary | ICD-10-CM

## 2013-07-09 DIAGNOSIS — Y929 Unspecified place or not applicable: Secondary | ICD-10-CM | POA: Insufficient documentation

## 2013-07-09 MED ORDER — LIDOCAINE-EPINEPHRINE (PF) 2 %-1:200000 IJ SOLN
INTRAMUSCULAR | Status: AC
  Administered 2013-07-09: 20 mL
  Filled 2013-07-09: qty 20
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
## 2013-07-09 NOTE — ED Notes (Signed)
ETOH on board 

## 2013-07-09 NOTE — ED Notes (Signed)
[employer] at bedside 
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
## 2013-07-09 NOTE — ED Notes (Signed)
MD at bedside. 

## 2013-07-10 ENCOUNTER — Emergency Department (HOSPITAL_COMMUNITY): Payer: Medicaid Other

## 2013-07-10 MED ORDER — CLINDAMYCIN HCL 150 MG PO CAPS: 150.0000 mg | ORAL_CAPSULE | Freq: Four times a day (QID) | ORAL | Status: DC

## 2013-07-10 MED ORDER — ACETAMINOPHEN 325 MG PO TABS
650.0000 mg | ORAL_TABLET | Freq: Once | ORAL | Status: AC
  Administered 2013-07-10: 650 mg via ORAL
  Filled 2013-07-10: qty 2
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
## 2013-07-11 ENCOUNTER — Encounter (HOSPITAL_COMMUNITY): Payer: Self-pay | Admitting: Emergency Medicine

## 2013-07-11 ENCOUNTER — Emergency Department (HOSPITAL_COMMUNITY): Payer: Medicaid Other

## 2013-07-11 ENCOUNTER — Emergency Department (HOSPITAL_COMMUNITY)
Admission: EM | Admit: 2013-07-11 | Discharge: 2013-07-11 | Disposition: A | Discharge status: Home / Self Care | Payer: Medicaid Other | Attending: Emergency Medicine | Admitting: Emergency Medicine

## 2013-07-11 DIAGNOSIS — F17200 Nicotine dependence, unspecified, uncomplicated: Secondary | ICD-10-CM | POA: Insufficient documentation

## 2013-07-11 DIAGNOSIS — G8911 Acute pain due to trauma: Secondary | ICD-10-CM | POA: Insufficient documentation

## 2013-07-11 DIAGNOSIS — M79642 Pain in left hand: Secondary | ICD-10-CM

## 2013-07-11 DIAGNOSIS — Z8659 Personal history of other mental and behavioral disorders: Secondary | ICD-10-CM | POA: Insufficient documentation

## 2013-07-11 DIAGNOSIS — M2550 Pain in unspecified joint: Secondary | ICD-10-CM | POA: Insufficient documentation

## 2013-07-11 DIAGNOSIS — Z8679 Personal history of other diseases of the circulatory system: Secondary | ICD-10-CM | POA: Insufficient documentation

## 2013-07-11 DIAGNOSIS — M79609 Pain in unspecified limb: Secondary | ICD-10-CM | POA: Insufficient documentation

## 2013-07-11 DIAGNOSIS — R609 Edema, unspecified: Secondary | ICD-10-CM | POA: Insufficient documentation

## 2013-07-11 MED ORDER — HYDROCODONE-ACETAMINOPHEN 5-325 MG PO TABS: ORAL_TABLET | ORAL | Status: DC
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
## 2013-07-14 ENCOUNTER — Encounter (HOSPITAL_COMMUNITY): Payer: Self-pay | Admitting: Emergency Medicine

## 2013-07-14 ENCOUNTER — Emergency Department (HOSPITAL_COMMUNITY)
Admission: EM | Admit: 2013-07-14 | Discharge: 2013-07-14 | Disposition: A | Discharge status: Home / Self Care | Payer: Medicaid Other | Attending: Emergency Medicine | Admitting: Emergency Medicine

## 2013-07-14 DIAGNOSIS — Z87828 Personal history of other (healed) physical injury and trauma: Secondary | ICD-10-CM | POA: Insufficient documentation

## 2013-07-14 DIAGNOSIS — M25549 Pain in joints of unspecified hand: Secondary | ICD-10-CM | POA: Insufficient documentation

## 2013-07-14 DIAGNOSIS — Z8679 Personal history of other diseases of the circulatory system: Secondary | ICD-10-CM | POA: Insufficient documentation

## 2013-07-14 DIAGNOSIS — Z8659 Personal history of other mental and behavioral disorders: Secondary | ICD-10-CM | POA: Insufficient documentation

## 2013-07-14 DIAGNOSIS — M79642 Pain in left hand: Secondary | ICD-10-CM

## 2013-07-14 DIAGNOSIS — F17200 Nicotine dependence, unspecified, uncomplicated: Secondary | ICD-10-CM | POA: Insufficient documentation

## 2013-07-14 MED ORDER — ACETAMINOPHEN 325 MG PO TABS
650.0000 mg | ORAL_TABLET | Freq: Once | ORAL | Status: AC
  Administered 2013-07-14: 650 mg via ORAL
  Filled 2013-07-14: qty 2

## 2013-07-14 NOTE — ED Provider Notes (Signed)
Medical screening examination/treatment/procedure(s) were performed by non-physician practitioner and as supervising physician I was immediately available for consultation/collaboration.  EKG Interpretation   None         Sevin Farone M Illyana Schorsch, DO 07/14/13 1127 
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
## 2013-07-14 NOTE — Discharge Instructions (Signed)
Call and make an appointment to followup with the hand surgeon. You may place ice on the wound and use Tylenol for pain control.  Laceration Care, Adult A laceration is a cut or lesion that goes through all layers of the skin and into the tissue just beneath the skin. TREATMENT  Some lacerations may not require closure. Some lacerations may not be able to be closed due to an increased risk of infection. It is important to see your caregiver as soon as possible after an injury to minimize the risk of infection and maximize the opportunity for successful closure. If closure is appropriate, pain medicines may be given, if needed. The wound will be cleaned to help prevent infection. Your caregiver will use stitches (sutures), staples, wound glue (adhesive), or skin adhesive strips to repair the laceration. These tools bring the skin edges together to allow for faster healing and a better cosmetic outcome. However, all wounds will heal with a scar. Once the wound has healed, scarring can be minimized by covering the wound with sunscreen during the day for 1 full year. HOME CARE INSTRUCTIONS  For sutures or staples:  Keep the wound clean and dry.  If you were given a bandage (dressing), you should change it at least once a day. Also, change the dressing if it becomes wet or dirty, or as directed by your caregiver.  Wash the wound with soap and water 2 times a day. Rinse the wound off with water to remove all soap. Pat the wound dry with a clean towel.  After cleaning, apply a thin layer of the antibiotic ointment as recommended by your caregiver. This will help prevent infection and keep the dressing from sticking.  You may shower as usual after the first 24 hours. Do not soak the wound in water until the sutures are removed.  Only take over-the-counter or prescription medicines for pain, discomfort, or fever as directed by your caregiver.  Get your sutures or staples removed as directed by your  caregiver. For skin adhesive strips:  Keep the wound clean and dry.  Do not get the skin adhesive strips wet. You may bathe carefully, using caution to keep the wound dry.  If the wound gets wet, pat it dry with a clean towel.  Skin adhesive strips will fall off on their own. You may trim the strips as the wound heals. Do not remove skin adhesive strips that are still stuck to the wound. They will fall off in time. For wound adhesive:  You may briefly wet your wound in the shower or bath. Do not soak or scrub the wound. Do not swim. Avoid periods of heavy perspiration until the skin adhesive has fallen off on its own. After showering or bathing, gently pat the wound dry with a clean towel.  Do not apply liquid medicine, cream medicine, or ointment medicine to your wound while the skin adhesive is in place. This may loosen the film before your wound is healed.  If a dressing is placed over the wound, be careful not to apply tape directly over the skin adhesive. This may cause the adhesive to be pulled off before the wound is healed.  Avoid prolonged exposure to sunlight or tanning lamps while the skin adhesive is in place. Exposure to ultraviolet light in the first year will darken the scar.  The skin adhesive will usually remain in place for 5 to 10 days, then naturally fall off the skin. Do not pick at the adhesive film.  You may need a tetanus shot if:  You cannot remember when you had your last tetanus shot.  You have never had a tetanus shot. If you get a tetanus shot, your arm may swell, get red, and feel warm to the touch. This is common and not a problem. If you need a tetanus shot and you choose not to have one, there is a rare chance of getting tetanus. Sickness from tetanus can be serious. SEEK MEDICAL CARE IF:   You have redness, swelling, or increasing pain in the wound.  You see a red line that goes away from the wound.  You have yellowish-white fluid (pus) coming from  the wound.  You have a fever.  You notice a bad smell coming from the wound or dressing.  Your wound breaks open before or after sutures have been removed.  You notice something coming out of the wound such as wood or glass.  Your wound is on your hand or foot and you cannot move a finger or toe. SEEK IMMEDIATE MEDICAL CARE IF:   Your pain is not controlled with prescribed medicine.  You have severe swelling around the wound causing pain and numbness or a change in color in your arm, hand, leg, or foot.  Your wound splits open and starts bleeding.  You have worsening numbness, weakness, or loss of function of any joint around or beyond the wound.  You develop painful lumps near the wound or on the skin anywhere on your body. MAKE SURE YOU:   Understand these instructions.  Will watch your condition.  Will get help right away if you are not doing well or get worse. Document Released: 05/21/2005 Document Revised: 08/13/2011 Document Reviewed: 11/14/2010 [facility] Patient Information 2014 [place], [place].
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
## 2014-03-19 ENCOUNTER — Emergency Department (HOSPITAL_COMMUNITY)
Admission: EM | Admit: 2014-03-19 | Discharge: 2014-03-19 | Disposition: A | Discharge status: Home / Self Care | Payer: Medicaid Other | Attending: Emergency Medicine | Admitting: Emergency Medicine

## 2014-03-19 ENCOUNTER — Encounter (HOSPITAL_COMMUNITY): Payer: Self-pay | Admitting: Emergency Medicine

## 2014-03-19 DIAGNOSIS — S0181XA Laceration without foreign body of other part of head, initial encounter: Secondary | ICD-10-CM | POA: Diagnosis not present

## 2014-03-19 DIAGNOSIS — Z8679 Personal history of other diseases of the circulatory system: Secondary | ICD-10-CM | POA: Diagnosis not present

## 2014-03-19 DIAGNOSIS — Z8659 Personal history of other mental and behavioral disorders: Secondary | ICD-10-CM | POA: Insufficient documentation

## 2014-03-19 DIAGNOSIS — Z720 Tobacco use: Secondary | ICD-10-CM | POA: Insufficient documentation

## 2014-03-19 DIAGNOSIS — S41112A Laceration without foreign body of left upper arm, initial encounter: Secondary | ICD-10-CM | POA: Insufficient documentation

## 2014-03-19 DIAGNOSIS — S0990XA Unspecified injury of head, initial encounter: Secondary | ICD-10-CM | POA: Diagnosis present

## 2014-03-19 MED ORDER — LIDOCAINE HCL (PF) 2 % IJ SOLN
INTRAMUSCULAR | Status: AC
  Administered 2014-03-19: 04:00:00
  Filled 2014-03-19: qty 10

## 2014-03-19 MED ORDER — BACITRACIN ZINC 500 UNIT/GM EX OINT
TOPICAL_OINTMENT | CUTANEOUS | Status: AC
  Administered 2014-03-19: 04:00:00
  Filled 2014-03-19: qty 0.9

## 2014-03-19 NOTE — Discharge Instructions (Signed)
Facial Laceration A facial laceration is a cut on the face. These injuries can be painful and cause bleeding. Some cuts may need to be closed with stitches (sutures), skin adhesive strips, or wound glue. Cuts usually heal quickly but can leave a scar. It can take 1-2 years for the scar to go away completely. HOME CARE   Only take medicines as told by your doctor.  Follow your doctor's instructions for wound care. For Stitches:  Keep the cut clean and dry.  If you have a bandage (dressing), change it at least once a day. Change the bandage if it gets wet or dirty, or as told by your doctor.  Wash the cut with soap and water 2 times a day. Rinse the cut with water. Pat it dry with a clean towel.  Put a thin layer of medicated cream on the cut as told by your doctor.  You may shower after the first 24 hours. Do not soak the cut in water until the stitches are removed.  Have your stitches removed as told by your doctor.  Do not wear any makeup until a few days after your stitches are removed. For Skin Adhesive Strips:  Keep the cut clean and dry.  Do not get the strips wet. You may take a bath, but be careful to keep the cut dry.  If the cut gets wet, pat it dry with a clean towel.  The strips will fall off on their own. Do not remove the strips that are still stuck to the cut. For Wound Glue:  You may shower or take baths. Do not soak or scrub the cut. Do not swim. Avoid heavy sweating until the glue falls off on its own. After a shower or bath, pat the cut dry with a clean towel.  Do not put medicine or makeup on your cut until the glue falls off.  If you have a bandage, do not put tape over the glue.  Avoid lots of sunlight or tanning lamps until the glue falls off.  The glue will fall off on its own in 5-10 days. Do not pick at the glue. After Healing: Put sunscreen on the cut for the first year to reduce your scar. GET HELP RIGHT AWAY IF:   Your cut area gets red,  painful, or puffy (swollen).  You see a yellowish-white fluid (pus) coming from the cut.  You have chills or a fever. MAKE SURE YOU:   Understand these instructions.  Will watch your condition.  Will get help right away if you are not doing well or get worse. Document Released: 11/07/2007 Document Revised: 03/11/2013 Document Reviewed: 01/01/2013 [facility] Patient Information 2015 [place]. This information is not intended to replace advice given to you by your health care provider. Make sure you discuss any questions you have with your health care provider.  Keep wounds dry for the next 24 hours. Suture and staple removal can be gone in 7 days. The facial sutures can come out 5-7 days. The left arm staple can be removed in 7-10 days.
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
## 2014-03-19 NOTE — ED Notes (Signed)
MD at bedside. 

## 2014-03-19 NOTE — ED Notes (Signed)
ETOH on board. Denies any drug use.

## 2014-03-25 ENCOUNTER — Emergency Department (HOSPITAL_COMMUNITY)
Admission: EM | Admit: 2014-03-25 | Discharge: 2014-03-25 | Disposition: A | Discharge status: Home / Self Care | Payer: Medicaid Other | Attending: Emergency Medicine | Admitting: Emergency Medicine

## 2014-03-25 ENCOUNTER — Encounter (HOSPITAL_COMMUNITY): Payer: Self-pay | Admitting: Emergency Medicine

## 2014-03-25 DIAGNOSIS — Z720 Tobacco use: Secondary | ICD-10-CM | POA: Insufficient documentation

## 2014-03-25 DIAGNOSIS — Z4802 Encounter for removal of sutures: Secondary | ICD-10-CM | POA: Diagnosis not present

## 2014-03-25 DIAGNOSIS — Z8679 Personal history of other diseases of the circulatory system: Secondary | ICD-10-CM | POA: Diagnosis not present

## 2014-03-25 DIAGNOSIS — Z8659 Personal history of other mental and behavioral disorders: Secondary | ICD-10-CM | POA: Insufficient documentation

## 2014-03-25 NOTE — ED Provider Notes (Signed)
Medical screening examination/treatment/procedure(s) were performed by non-physician practitioner and as supervising physician I was immediately available for consultation/collaboration.   EKG Interpretation None        Yahshua Thibault L Syvanna Ciolino, MD 03/25/14 1531 
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
## 2014-03-25 NOTE — ED Notes (Signed)
Suture removal

## 2014-03-25 NOTE — ED Notes (Signed)
5 sutures removed from patients fore head. Edges approximated, no signs of infection present, no bleeding.   1 staple removed from left arm. Edges approximated, no signs of infection present, no bleeding.

## 2014-03-25 NOTE — Discharge Instructions (Signed)

## 2014-04-27 ENCOUNTER — Emergency Department (HOSPITAL_COMMUNITY)
Admission: EM | Admit: 2014-04-27 | Discharge: 2014-04-27 | Disposition: A | Discharge status: Home / Self Care | Payer: Medicaid Other | Attending: Emergency Medicine | Admitting: Emergency Medicine

## 2014-04-27 ENCOUNTER — Encounter (HOSPITAL_COMMUNITY): Payer: Self-pay

## 2014-04-27 DIAGNOSIS — R51 Headache: Secondary | ICD-10-CM | POA: Insufficient documentation

## 2014-04-27 DIAGNOSIS — Z720 Tobacco use: Secondary | ICD-10-CM | POA: Insufficient documentation

## 2014-04-27 DIAGNOSIS — Z8679 Personal history of other diseases of the circulatory system: Secondary | ICD-10-CM | POA: Insufficient documentation

## 2014-04-27 DIAGNOSIS — Z8659 Personal history of other mental and behavioral disorders: Secondary | ICD-10-CM | POA: Insufficient documentation

## 2014-04-27 DIAGNOSIS — R519 Headache, unspecified: Secondary | ICD-10-CM

## 2014-04-27 HISTORY — DX: Alcohol abuse, uncomplicated: F10.10

## 2014-04-27 MED ORDER — ACETAMINOPHEN 500 MG PO TABS
1000.0000 mg | ORAL_TABLET | Freq: Once | ORAL | Status: AC
  Administered 2014-04-27: 1000 mg via ORAL
  Filled 2014-04-27: qty 2

## 2014-04-27 NOTE — ED Notes (Signed)
Asleep on stretcher.

## 2014-04-27 NOTE — ED Provider Notes (Signed)
CSN: [id number]     Arrival date & time 04/27/14  0258 History   First MD Initiated Contact with Patient 04/27/14 0354     Chief Complaint  Patient presents with  . Headache     (Consider location/radiation/quality/duration/timing/severity/associated sxs/prior Treatment) HPI Comments: Patient is a 48 year old male who presents with complaints of headache. This occurred in the absence of any injury or trauma. He denies any fevers, chills, or stiff neck. He denies any visual disturbances, nausea, or vomiting.  Patient is a 48 y.o. male presenting with headaches. The history is provided by the patient.  Headache Pain location:  Generalized Quality:  Dull Radiates to:  Does not radiate Onset quality:  Gradual Duration:  3 hours Timing:  Constant Progression:  Unchanged Chronicity:  New Similar to prior headaches: yes   Context: activity   Relieved by:  Nothing Worsened by:  Nothing tried Ineffective treatments:  None tried   Past Medical History  Diagnosis Date  . Depression   . ADHD (attention deficit hyperactivity disorder)   . Irregular heart beat   . Alcohol abuse    History reviewed. No pertinent past surgical history. Family History  Problem Relation Age of Onset  . Cancer    . Diabetes     History  Substance Use Topics  . Smoking status: Current Every Day Smoker -- 0.50 packs/day for 15 years    Types: Cigarettes  . Smokeless tobacco: Never Used  . Alcohol Use: 1.2 oz/week    2 Cans of beer per week     Comment: weekends    Review of Systems  Neurological: Positive for headaches.  All other systems reviewed and are negative.     Allergies  Nsaids and Tramadol  Home Medications   Prior to Admission medications   Medication Sig Start Date End Date Taking? Authorizing Provider  oxyCODONE (OXY IR/ROXICODONE) 5 MG immediate release tablet Take 5 mg by mouth 3 (three) times daily. 05/17/13  Yes Toy CookeyMegan Docherty, MD   BP 130/96 mmHg  Pulse 79   Temp(Src) 98.2 F (36.8 C) (Oral)  Resp 16  Ht 5\' 11"  (1.803 m)  Wt 170 lb (77.111 kg)  BMI 23.72 kg/m2  SpO2 96% Physical Exam  Constitutional: He is oriented to person, place, and time. He appears well-developed and well-nourished. No distress.  HENT:  Head: Normocephalic and atraumatic.  Mouth/Throat: Oropharynx is clear and moist.  Eyes: EOM are normal. Pupils are equal, round, and reactive to light.  Neck: Normal range of motion. Neck supple.  Musculoskeletal: Normal range of motion. He exhibits no edema.  Lymphadenopathy:    He has no cervical adenopathy.  Neurological: He is alert and oriented to person, place, and time. No cranial nerve deficit. He exhibits normal muscle tone. Coordination normal.  Skin: Skin is warm and dry. He is not diaphoretic.  Nursing note and vitals reviewed.   ED Course  Procedures (including critical care time) Labs Review Labs Reviewed - No data to display  Imaging Review No results found.   EKG Interpretation None      MDM   Final diagnoses:  Nonintractable headache, unspecified chronicity pattern, unspecified headache type    Patient presents with complaints of headache. This is been going on for 3 hours, however he has not taken any medications. He will be given Tylenol and discharged to home. There is nothing on his exam that is concerning. His neuro exam is nonfocal and there is no nuchal rigidity. I see no indication for  CT or LP. His return as needed for any problems.    Geoffery Lyonsouglas Adrik Khim, MD 04/27/14 [phone]

## 2014-04-27 NOTE — ED Notes (Signed)
Patient states headache that started 3+ hours ago. Patient has been lingering in waiting room, patient states  "trying to decide if i wanted to check in"

## 2014-04-27 NOTE — Discharge Instructions (Signed)
Tylenol 1000 mg rotated with ibuprofen 600 mg every 4 hours as needed for headache.   General Headache Without Cause A headache is pain or discomfort felt around the head or neck area. The specific cause of a headache may not be found. There are many causes and types of headaches. A few common ones are:  Tension headaches.  Migraine headaches.  Cluster headaches.  Chronic daily headaches. HOME CARE INSTRUCTIONS   Keep all follow-up appointments with your caregiver or any specialist referral.  Only take over-the-counter or prescription medicines for pain or discomfort as directed by your caregiver.  Lie down in a dark, quiet room when you have a headache.  Keep a headache journal to find out what may trigger your migraine headaches. For example, write down:  What you eat and drink.  How much sleep you get.  Any change to your diet or medicines.  Try massage or other relaxation techniques.  Put ice packs or heat on the head and neck. Use these 3 to 4 times per day for 15 to 20 minutes each time, or as needed.  Limit stress.  Sit up straight, and do not tense your muscles.  Quit smoking if you smoke.  Limit alcohol use.  Decrease the amount of caffeine you drink, or stop drinking caffeine.  Eat and sleep on a regular schedule.  Get 7 to 9 hours of sleep, or as recommended by your caregiver.  Keep lights dim if bright lights bother you and make your headaches worse. SEEK MEDICAL CARE IF:   You have problems with the medicines you were prescribed.  Your medicines are not working.  You have a change from the usual headache.  You have nausea or vomiting. SEEK IMMEDIATE MEDICAL CARE IF:   Your headache becomes severe.  You have a fever.  You have a stiff neck.  You have loss of vision.  You have muscular weakness or loss of muscle control.  You start losing your balance or have trouble walking.  You feel faint or pass out.  You have severe symptoms  that are different from your first symptoms. MAKE SURE YOU:   Understand these instructions.  Will watch your condition.  Will get help right away if you are not doing well or get worse. Document Released: 05/21/2005 Document Revised: 08/13/2011 Document Reviewed: 06/06/2011 [facility] Patient Information 2015 [place], [place]. This information is not intended to replace advice given to you by your health care provider. Make sure you discuss any questions you have with your health care provider.

## 2014-05-16 ENCOUNTER — Encounter (HOSPITAL_COMMUNITY): Payer: Self-pay | Admitting: Emergency Medicine

## 2014-05-16 ENCOUNTER — Emergency Department (HOSPITAL_COMMUNITY): Payer: Medicaid Other

## 2014-05-16 ENCOUNTER — Emergency Department (HOSPITAL_COMMUNITY)
Admission: EM | Admit: 2014-05-16 | Discharge: 2014-05-16 | Disposition: A | Discharge status: Home / Self Care | Payer: Medicaid Other | Attending: Emergency Medicine | Admitting: Emergency Medicine

## 2014-05-16 DIAGNOSIS — S0990XA Unspecified injury of head, initial encounter: Secondary | ICD-10-CM

## 2014-05-16 DIAGNOSIS — Y998 Other external cause status: Secondary | ICD-10-CM | POA: Diagnosis not present

## 2014-05-16 DIAGNOSIS — Y92410 Unspecified street and highway as the place of occurrence of the external cause: Secondary | ICD-10-CM | POA: Diagnosis not present

## 2014-05-16 DIAGNOSIS — Z8659 Personal history of other mental and behavioral disorders: Secondary | ICD-10-CM | POA: Insufficient documentation

## 2014-05-16 DIAGNOSIS — Y9389 Activity, other specified: Secondary | ICD-10-CM | POA: Diagnosis not present

## 2014-05-16 DIAGNOSIS — S0083XA Contusion of other part of head, initial encounter: Secondary | ICD-10-CM | POA: Diagnosis not present

## 2014-05-16 DIAGNOSIS — Z720 Tobacco use: Secondary | ICD-10-CM | POA: Insufficient documentation

## 2014-05-16 DIAGNOSIS — S60221A Contusion of right hand, initial encounter: Secondary | ICD-10-CM

## 2014-05-16 DIAGNOSIS — S3992XA Unspecified injury of lower back, initial encounter: Secondary | ICD-10-CM | POA: Diagnosis not present

## 2014-05-16 DIAGNOSIS — R040 Epistaxis: Secondary | ICD-10-CM | POA: Insufficient documentation

## 2014-05-16 MED ORDER — OXYCODONE-ACETAMINOPHEN 5-325 MG PO TABS
2.0000 | ORAL_TABLET | Freq: Once | ORAL | Status: AC
  Administered 2014-05-16: 2 via ORAL
  Filled 2014-05-16: qty 2

## 2014-05-16 MED ORDER — OXYCODONE-ACETAMINOPHEN 5-325 MG PO TABS: 1.0000 | ORAL_TABLET | ORAL | Status: DC | PRN
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
## 2014-05-16 NOTE — Discharge Instructions (Signed)
Please call your doctor for a followup appointment within 24-48 hours. When you talk to your doctor please let them know that you were seen in the emergency department and have them acquire all of your records so that they can discuss the findings with you and formulate a treatment plan to fully care for your new and ongoing problems. ° °
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
## 2014-05-22 ENCOUNTER — Emergency Department (HOSPITAL_COMMUNITY): Payer: Medicaid Other

## 2014-05-22 ENCOUNTER — Encounter (HOSPITAL_COMMUNITY): Payer: Self-pay | Admitting: Emergency Medicine

## 2014-05-22 ENCOUNTER — Emergency Department (HOSPITAL_COMMUNITY)
Admission: EM | Admit: 2014-05-22 | Discharge: 2014-05-22 | Disposition: A | Discharge status: Home / Self Care | Payer: Medicaid Other | Attending: Emergency Medicine | Admitting: Emergency Medicine

## 2014-05-22 DIAGNOSIS — M79641 Pain in right hand: Secondary | ICD-10-CM | POA: Diagnosis not present

## 2014-05-22 DIAGNOSIS — Z79891 Long term (current) use of opiate analgesic: Secondary | ICD-10-CM | POA: Insufficient documentation

## 2014-05-22 DIAGNOSIS — Z8659 Personal history of other mental and behavioral disorders: Secondary | ICD-10-CM | POA: Insufficient documentation

## 2014-05-22 DIAGNOSIS — Z720 Tobacco use: Secondary | ICD-10-CM | POA: Insufficient documentation

## 2014-05-22 DIAGNOSIS — G8911 Acute pain due to trauma: Secondary | ICD-10-CM | POA: Diagnosis not present

## 2014-05-22 DIAGNOSIS — M25512 Pain in left shoulder: Secondary | ICD-10-CM | POA: Diagnosis present

## 2014-05-22 MED ORDER — OXYCODONE HCL 5 MG PO TABS: 5.0000 mg | ORAL_TABLET | ORAL | Status: DC | PRN

## 2014-05-22 NOTE — Discharge Instructions (Signed)
See Dr. Janna Archondiego for your pain medication refill.

## 2014-05-22 NOTE — ED Notes (Signed)
Pt c/o left shoulder pain x one week.
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
## 2014-05-29 ENCOUNTER — Encounter (HOSPITAL_COMMUNITY): Payer: Self-pay

## 2014-05-29 ENCOUNTER — Emergency Department (HOSPITAL_COMMUNITY)
Admission: EM | Admit: 2014-05-29 | Discharge: 2014-05-29 | Disposition: A | Discharge status: Home / Self Care | Payer: Medicaid Other | Attending: Emergency Medicine | Admitting: Emergency Medicine

## 2014-05-29 ENCOUNTER — Emergency Department (HOSPITAL_COMMUNITY): Payer: Medicaid Other

## 2014-05-29 DIAGNOSIS — S6991XA Unspecified injury of right wrist, hand and finger(s), initial encounter: Secondary | ICD-10-CM | POA: Insufficient documentation

## 2014-05-29 DIAGNOSIS — Z8659 Personal history of other mental and behavioral disorders: Secondary | ICD-10-CM | POA: Insufficient documentation

## 2014-05-29 DIAGNOSIS — Y998 Other external cause status: Secondary | ICD-10-CM | POA: Insufficient documentation

## 2014-05-29 DIAGNOSIS — M25512 Pain in left shoulder: Secondary | ICD-10-CM

## 2014-05-29 DIAGNOSIS — S4992XA Unspecified injury of left shoulder and upper arm, initial encounter: Secondary | ICD-10-CM | POA: Insufficient documentation

## 2014-05-29 DIAGNOSIS — Y9389 Activity, other specified: Secondary | ICD-10-CM | POA: Insufficient documentation

## 2014-05-29 DIAGNOSIS — Z720 Tobacco use: Secondary | ICD-10-CM | POA: Diagnosis not present

## 2014-05-29 DIAGNOSIS — Y92410 Unspecified street and highway as the place of occurrence of the external cause: Secondary | ICD-10-CM | POA: Diagnosis not present

## 2014-05-29 DIAGNOSIS — Z8679 Personal history of other diseases of the circulatory system: Secondary | ICD-10-CM | POA: Diagnosis not present

## 2014-05-29 DIAGNOSIS — M25531 Pain in right wrist: Secondary | ICD-10-CM

## 2014-05-29 MED ORDER — ACETAMINOPHEN 500 MG PO TABS: 1000.0000 mg | ORAL_TABLET | Freq: Once | ORAL | Status: DC

## 2014-05-29 MED ORDER — PREDNISONE 50 MG PO TABS: ORAL_TABLET | ORAL | Status: DC
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
## 2014-05-29 NOTE — Discharge Instructions (Signed)
X-rays were normal. Prescription for prednisone for pain and inflammation
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
## 2014-06-10 ENCOUNTER — Emergency Department (HOSPITAL_COMMUNITY)
Admission: EM | Admit: 2014-06-10 | Discharge: 2014-06-10 | Disposition: A | Discharge status: Home / Self Care | Payer: Medicaid Other | Attending: Emergency Medicine | Admitting: Emergency Medicine

## 2014-06-10 ENCOUNTER — Encounter (HOSPITAL_COMMUNITY): Payer: Self-pay | Admitting: *Deleted

## 2014-06-10 DIAGNOSIS — M79641 Pain in right hand: Secondary | ICD-10-CM | POA: Diagnosis not present

## 2014-06-10 DIAGNOSIS — Y9389 Activity, other specified: Secondary | ICD-10-CM | POA: Diagnosis not present

## 2014-06-10 DIAGNOSIS — Z79899 Other long term (current) drug therapy: Secondary | ICD-10-CM | POA: Diagnosis not present

## 2014-06-10 DIAGNOSIS — Z8679 Personal history of other diseases of the circulatory system: Secondary | ICD-10-CM | POA: Diagnosis not present

## 2014-06-10 DIAGNOSIS — Y9289 Other specified places as the place of occurrence of the external cause: Secondary | ICD-10-CM | POA: Insufficient documentation

## 2014-06-10 DIAGNOSIS — S1091XA Abrasion of unspecified part of neck, initial encounter: Secondary | ICD-10-CM | POA: Insufficient documentation

## 2014-06-10 DIAGNOSIS — Y998 Other external cause status: Secondary | ICD-10-CM | POA: Diagnosis not present

## 2014-06-10 DIAGNOSIS — Z8659 Personal history of other mental and behavioral disorders: Secondary | ICD-10-CM | POA: Insufficient documentation

## 2014-06-10 DIAGNOSIS — S0501XA Injury of conjunctiva and corneal abrasion without foreign body, right eye, initial encounter: Secondary | ICD-10-CM | POA: Insufficient documentation

## 2014-06-10 DIAGNOSIS — Z720 Tobacco use: Secondary | ICD-10-CM | POA: Insufficient documentation

## 2014-06-10 NOTE — ED Notes (Signed)
Pt alert & oriented x4, stable gait. Patient given discharge instructions, paperwork & prescription(s). Patient  instructed to stop at the registration desk to finish any additional paperwork. Patient verbalized understanding. Pt left department w/ no further questions. 
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
## 2014-06-10 NOTE — Discharge Instructions (Signed)
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
## 2014-06-10 NOTE — ED Notes (Signed)
[employer] in speaking to pt.
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
## 2014-12-11 ENCOUNTER — Observation Stay (HOSPITAL_BASED_OUTPATIENT_CLINIC_OR_DEPARTMENT_OTHER): Payer: Medicaid Other

## 2014-12-11 ENCOUNTER — Emergency Department (HOSPITAL_COMMUNITY): Payer: Medicaid Other

## 2014-12-11 ENCOUNTER — Observation Stay (HOSPITAL_COMMUNITY): Payer: Medicaid Other

## 2014-12-11 ENCOUNTER — Encounter (HOSPITAL_COMMUNITY): Payer: Self-pay

## 2014-12-11 ENCOUNTER — Observation Stay (HOSPITAL_COMMUNITY)
Admission: EM | Admit: 2014-12-11 | Discharge: 2014-12-11 | Disposition: A | Discharge status: Home / Self Care | Payer: Medicaid Other | Attending: Internal Medicine | Admitting: Internal Medicine

## 2014-12-11 DIAGNOSIS — Y9289 Other specified places as the place of occurrence of the external cause: Secondary | ICD-10-CM | POA: Diagnosis not present

## 2014-12-11 DIAGNOSIS — R0602 Shortness of breath: Secondary | ICD-10-CM | POA: Insufficient documentation

## 2014-12-11 DIAGNOSIS — M549 Dorsalgia, unspecified: Secondary | ICD-10-CM | POA: Diagnosis present

## 2014-12-11 DIAGNOSIS — Z79899 Other long term (current) drug therapy: Secondary | ICD-10-CM | POA: Insufficient documentation

## 2014-12-11 DIAGNOSIS — F329 Major depressive disorder, single episode, unspecified: Secondary | ICD-10-CM | POA: Diagnosis not present

## 2014-12-11 DIAGNOSIS — Z720 Tobacco use: Secondary | ICD-10-CM | POA: Insufficient documentation

## 2014-12-11 DIAGNOSIS — I499 Cardiac arrhythmia, unspecified: Secondary | ICD-10-CM | POA: Insufficient documentation

## 2014-12-11 DIAGNOSIS — F909 Attention-deficit hyperactivity disorder, unspecified type: Secondary | ICD-10-CM | POA: Insufficient documentation

## 2014-12-11 DIAGNOSIS — Y9389 Activity, other specified: Secondary | ICD-10-CM | POA: Insufficient documentation

## 2014-12-11 DIAGNOSIS — R079 Chest pain, unspecified: Principal | ICD-10-CM | POA: Diagnosis present

## 2014-12-11 DIAGNOSIS — W1839XA Other fall on same level, initial encounter: Secondary | ICD-10-CM | POA: Diagnosis not present

## 2014-12-11 DIAGNOSIS — Y998 Other external cause status: Secondary | ICD-10-CM | POA: Diagnosis not present

## 2014-12-11 DIAGNOSIS — S93402A Sprain of unspecified ligament of left ankle, initial encounter: Secondary | ICD-10-CM | POA: Diagnosis not present

## 2014-12-11 LAB — TROPONIN I
Troponin I: <0.03 ng/mL (ref <0.031)
Troponin I: <0.03 ng/mL (ref <0.031)
Troponin I: <0.03 ng/mL (ref <0.031)

## 2014-12-11 LAB — BASIC METABOLIC PANEL
Anion gap: 11 (ref 5–15)
BUN: 14 mg/dL (ref 6–20)
CHLORIDE: 101 mmol/L (ref 101–111)
CO2: 24 mmol/L (ref 22–32)
CREATININE: 0.99 mg/dL (ref 0.61–1.24)
Calcium: 8.5 mg/dL — ABNORMAL LOW (ref 8.9–10.3)
GFR calc non Af Amer: >60 mL/min (ref >60)
Glucose, Bld: 94 mg/dL (ref 65–99)
POTASSIUM: 3.6 mmol/L (ref 3.5–5.1)
Sodium: 136 mmol/L (ref 135–145)

## 2014-12-11 LAB — CBC WITH DIFFERENTIAL/PLATELET
BASOS PCT: 0 % (ref 0–1)
BASOS PCT: 0 % (ref 0–1)
Basophils Absolute: 0 10*3/uL (ref 0.0–0.1)
Basophils Absolute: 0 10*3/uL (ref 0.0–0.1)
EOS PCT: 2 % (ref 0–5)
Eosinophils Absolute: 0.1 10*3/uL (ref 0.0–0.7)
Eosinophils Absolute: 0.1 10*3/uL (ref 0.0–0.7)
Eosinophils Relative: 1 % (ref 0–5)
HCT: 43.9 % (ref 39.0–52.0)
HCT: 42.3 % (ref 39.0–52.0)
HEMOGLOBIN: 14.9 g/dL (ref 13.0–17.0)
Hemoglobin: 14.2 g/dL (ref 13.0–17.0)
LYMPHS ABS: 1.7 10*3/uL (ref 0.7–4.0)
Lymphocytes Relative: 27 % (ref 12–46)
Lymphocytes Relative: 26 % (ref 12–46)
Lymphs Abs: 2 10*3/uL (ref 0.7–4.0)
MCH: 31.6 pg (ref 26.0–34.0)
MCH: 32.2 pg (ref 26.0–34.0)
MCHC: 33.9 g/dL (ref 30.0–36.0)
MCHC: 33.6 g/dL (ref 30.0–36.0)
MCV: 94.2 fL (ref 78.0–100.0)
MCV: 94.8 fL (ref 78.0–100.0)
MONO ABS: 0.7 10*3/uL (ref 0.1–1.0)
MONO ABS: 0.9 10*3/uL (ref 0.1–1.0)
MONOS PCT: 11 % (ref 3–12)
Monocytes Relative: 12 % (ref 3–12)
NEUTROS ABS: 4.8 10*3/uL (ref 1.7–7.7)
NEUTROS PCT: 61 % (ref 43–77)
Neutro Abs: 3.9 10*3/uL (ref 1.7–7.7)
Neutrophils Relative %: 60 % (ref 43–77)
Platelets: 183 10*3/uL (ref 150–400)
Platelets: 207 10*3/uL (ref 150–400)
RBC: 4.49 MIL/uL (ref 4.22–5.81)
RBC: 4.63 MIL/uL (ref 4.22–5.81)
RDW: 14.1 % (ref 11.5–15.5)
RDW: 14.1 % (ref 11.5–15.5)
WBC: 6.3 10*3/uL (ref 4.0–10.5)
WBC: 7.8 10*3/uL (ref 4.0–10.5)

## 2014-12-11 LAB — COMPREHENSIVE METABOLIC PANEL
ALT: 21 U/L (ref 17–63)
AST: 35 U/L (ref 15–41)
Albumin: 3.5 g/dL (ref 3.5–5.0)
Alkaline Phosphatase: 52 U/L (ref 38–126)
Anion gap: 8 (ref 5–15)
BUN: 15 mg/dL (ref 6–20)
CALCIUM: 8.3 mg/dL — AB (ref 8.9–10.3)
CO2: 24 mmol/L (ref 22–32)
CREATININE: 0.87 mg/dL (ref 0.61–1.24)
Chloride: 103 mmol/L (ref 101–111)
GFR calc Af Amer: >60 mL/min (ref >60)
Glucose, Bld: 107 mg/dL — ABNORMAL HIGH (ref 65–99)
Potassium: 4 mmol/L (ref 3.5–5.1)
SODIUM: 135 mmol/L (ref 135–145)
TOTAL PROTEIN: 6.9 g/dL (ref 6.5–8.1)
Total Bilirubin: 0.7 mg/dL (ref 0.3–1.2)

## 2014-12-11 LAB — D-DIMER, QUANTITATIVE: D-Dimer, Quant: 0.54 ug/mL-FEU — ABNORMAL HIGH (ref 0.00–0.48)

## 2014-12-11 LAB — HEPATIC FUNCTION PANEL
ALT: 24 U/L (ref 17–63)
AST: 41 U/L (ref 15–41)
Albumin: 3.9 g/dL (ref 3.5–5.0)
Alkaline Phosphatase: 54 U/L (ref 38–126)
BILIRUBIN DIRECT: 0.2 mg/dL (ref 0.1–0.5)
BILIRUBIN TOTAL: 0.9 mg/dL (ref 0.3–1.2)
Indirect Bilirubin: 0.7 mg/dL (ref 0.3–0.9)
Total Protein: 7.5 g/dL (ref 6.5–8.1)

## 2014-12-11 LAB — RAPID URINE DRUG SCREEN, HOSP PERFORMED
Amphetamines: NOT DETECTED
Barbiturates: NOT DETECTED
Benzodiazepines: NOT DETECTED
Cocaine: NOT DETECTED
Opiates: NOT DETECTED
Tetrahydrocannabinol: POSITIVE — AB

## 2014-12-11 LAB — I-STAT TROPONIN, ED: TROPONIN I, POC: 0 ng/mL (ref 0.00–0.08)

## 2014-12-11 LAB — LIPASE, BLOOD: Lipase: 17 U/L — ABNORMAL LOW (ref 22–51)

## 2014-12-11 MED ORDER — ACETAMINOPHEN 650 MG RE SUPP: 650.0000 mg | Freq: Four times a day (QID) | RECTAL | Status: DC | PRN

## 2014-12-11 MED ORDER — ASPIRIN EC 325 MG PO TBEC
325.0000 mg | DELAYED_RELEASE_TABLET | Freq: Every day | ORAL | Status: DC
  Administered 2014-12-11: 325 mg via ORAL
  Filled 2014-12-11: qty 1

## 2014-12-11 MED ORDER — ENOXAPARIN SODIUM 40 MG/0.4ML ~~LOC~~ SOLN
40.0000 mg | SUBCUTANEOUS | Status: DC
  Administered 2014-12-11: 40 mg via SUBCUTANEOUS
  Filled 2014-12-11: qty 0.4

## 2014-12-11 MED ORDER — OXYCODONE HCL 5 MG PO TABS: 5.0000 mg | ORAL_TABLET | ORAL | Status: DC | PRN

## 2014-12-11 MED ORDER — ACETAMINOPHEN 325 MG PO TABS: 650.0000 mg | ORAL_TABLET | Freq: Four times a day (QID) | ORAL | Status: DC | PRN

## 2014-12-11 MED ORDER — ONDANSETRON HCL 4 MG/2ML IJ SOLN: 4.0000 mg | Freq: Four times a day (QID) | INTRAMUSCULAR | Status: DC | PRN

## 2014-12-11 MED ORDER — ONDANSETRON HCL 4 MG PO TABS: 4.0000 mg | ORAL_TABLET | Freq: Four times a day (QID) | ORAL | Status: DC | PRN

## 2014-12-11 MED ORDER — ASPIRIN 325 MG PO TBEC: 325.0000 mg | DELAYED_RELEASE_TABLET | Freq: Every day | ORAL | Status: DC

## 2014-12-11 MED ORDER — SODIUM CHLORIDE 0.9 % IJ SOLN
3.0000 mL | Freq: Two times a day (BID) | INTRAMUSCULAR | Status: DC
  Administered 2014-12-11: 3 mL via INTRAVENOUS

## 2014-12-11 MED ORDER — IOHEXOL 350 MG/ML SOLN
100.0000 mL | Freq: Once | INTRAVENOUS | Status: AC | PRN
  Administered 2014-12-11: 100 mL via INTRAVENOUS

## 2014-12-11 NOTE — Discharge Summary (Signed)
Physician Discharge Summary  Terrence AnchorsJames J Binion UEA:[id number] DOB: 06/15/1965 DOA: 12/11/2014  PCP: Isabella StallingNDIEGO,RICHARD M, MD  Admit date: 12/11/2014 Discharge date: 12/11/2014  Time spent: 35 minutes  Recommendations for Outpatient Follow-up:  1. Follow up with your PCP as schedule.    Discharge Diagnoses:  Principal Problem:   Chest pain Active Problems:   Backache   Discharge Condition: no further chest pain.  Diet recommendation: cardiac diet.   Filed Weights   12/11/14 0013  Weight: 77.111 kg (170 lb)    History of present illness: patient was admitted for atypical chest pain by Dr Fredrich RomansKrakakandy on December 11, 2014.  As per his H and P:  " Terrence Strickland is a 49 y.o. male with history of tobacco abuse and chronic back pain presents to the ER because of chest pain. Patient states patient has been experiencing chest pain off and on last 2 days. Pain lasts for less than a minute each time. Patient's chest pain is left-sided and is sharp stabbing in nature. Denies any radiation or any association with exertion or shortness of breath. Denies any productive cough fever chills. EKG in the ER showed features comparable to the old EKG and troponins were negative. Presently patient with chest pain free.   Hospital Course:  It should be noted that he was a patient of Dr Janna Archondiego, and was to be seen by him, but it was dropped due to system failure, and I was asked to see him at 1800 today.  He was admitted for r/out, and had negative troponins.  He also had no further chest pain.  Though low likely hood of PE, with no tachycardia or hypoxia, a D dimer was obtained, and the result was 0.54.  Because it was abnormal, I ordered a CTPA and no PE was found.  He also had a negative ECHO with normal wall motion, but some atrial enlargement.   His BP was controlled.  As planned, he will be discharged today to follow up with his PCP as scheduled.  Thank you, Dr Janna Archondiego, for allowing me to participate in his care.     Procedures:  ECHO, CTPA.   Consultations:  NONE>   Discharge Exam: Filed Vitals:   12/11/14 1509  BP: 129/79  Pulse: 48  Temp: 98.5 F (36.9 C)  Resp: 20     Discharge Instructions   Discharge Instructions    Diet - low sodium heart healthy    Complete by:  As directed      Discharge instructions    Complete by:  As directed   Follow up with your PCP.     Increase activity slowly    Complete by:  As directed           Current Discharge Medication List    START taking these medications   Details  aspirin EC 325 MG EC tablet Take 1 tablet (325 mg total) by mouth daily. Qty: 30 tablet, Refills: 0      CONTINUE these medications which have NOT CHANGED   Details  oxyCODONE (ROXICODONE) 5 MG immediate release tablet Take 1 tablet (5 mg total) by mouth every 4 (four) hours as needed for severe pain. Qty: 12 tablet, Refills: 0      STOP taking these medications     oxyCODONE-acetaminophen (PERCOCET) 5-325 MG per tablet      predniSONE (DELTASONE) 50 MG tablet        Allergies  Allergen Reactions  . Nsaids Other (See Comments)  hallucinations  . Tramadol Other (See Comments)    Chills, hallucinations, sweating        The results of significant diagnostics from this hospitalization (including imaging, microbiology, ancillary and laboratory) are listed below for reference.    Significant Diagnostic Studies: Dg Chest 2 View  12/11/2014   CLINICAL DATA:  Left-sided chest pain.  Recent fall.  EXAM: CHEST  2 VIEW  COMPARISON:  05/17/2013  FINDINGS: Normal heart size and mediastinal contours. No acute infiltrate or edema. No effusion or pneumothorax. No acute osseous findings.  IMPRESSION: No active cardiopulmonary disease.   Electronically Signed   By: Marnee Spring M.D.   On: 12/11/2014 01:28   Dg Ankle Complete Left  12/11/2014   CLINICAL DATA:  Generalized left ankle pain after fall. Initial encounter.  EXAM: LEFT ANKLE COMPLETE - 3+ VIEW   COMPARISON:  None.  FINDINGS: There is no evidence of fracture or dislocation. The ankle mortise is intact; the interosseous space is within normal limits. No talar tilt or subluxation is seen.  The joint spaces are preserved. No significant soft tissue abnormalities are seen.  IMPRESSION: No evidence of fracture or dislocation.   Electronically Signed   By: Roanna Raider M.D.   On: 12/11/2014 01:29   Ct Angio Chest Pe W/cm &/or Wo Cm  12/11/2014   CLINICAL DATA:  Intermittent left-sided chest pain for 2 days.  EXAM: CT ANGIOGRAPHY CHEST WITH CONTRAST  TECHNIQUE: Multidetector CT imaging of the chest was performed using the standard protocol during bolus administration of intravenous contrast. Multiplanar CT image reconstructions and MIPs were obtained to evaluate the vascular anatomy.  CONTRAST:  OMNIPAQUE IOHEXOL 350 MG/ML SOLN  COMPARISON:  None.  FINDINGS: Chest wall: No chest wall mass, supraclavicular or axillary lymphadenopathy. Suspect bilateral thyroid nodules. Recommend followup thyroid ultrasound examination. The bony thorax is intact. No destructive bone lesions or spinal canal compromise.  Mediastinum: The heart is normal in size. No pericardial effusion. No mediastinal or hilar mass or adenopathy. Small scattered lymph nodes are noted. The aorta is normal in caliber. The branch vessels are patent the esophagus is normal. Some reflux of contrast is noted down the azygos vein and down the IVC.  The pulmonary arterial tree is fairly well opacified. No filling defects to suggest pulmonary embolism.  Lungs: Mild emphysematous changes are noted. No acute pulmonary findings. No interstitial lung disease or bronchiectasis. No worrisome pulmonary lesions. No pleural effusion.  Upper abdomen:  No significant findings.  Review of the MIP images confirms the above findings.  IMPRESSION: 1. No CT findings for pulmonary embolism. 2. Normal thoracic aorta. 3. No mediastinal or hilar mass or adenopathy. 4. No  acute pulmonary findings. There are early emphysematous changes noted.   Electronically Signed   By: Rudie Meyer M.D.   On: 12/11/2014 20:28    Microbiology: No results found for this or any previous visit (from the past 240 hour(s)).   Labs: Basic Metabolic Panel:  Recent Labs Lab 12/11/14 0058 12/11/14 0647  NA 136 135  K 3.6 4.0  CL 101 103  CO2 24 24  GLUCOSE 94 107*  BUN 14 15  CREATININE 0.99 0.87  CALCIUM 8.5* 8.3*   Liver Function Tests:  Recent Labs Lab 12/11/14 0058 12/11/14 0647  AST 41 35  ALT 24 21  ALKPHOS 54 52  BILITOT 0.9 0.7  PROT 7.5 6.9  ALBUMIN 3.9 3.5    Recent Labs Lab 12/11/14 0058  LIPASE 17*  CBC:  Recent Labs Lab 12/11/14 0058 12/11/14 0647  WBC 6.3 7.8  NEUTROABS 3.9 4.8  HGB 14.9 14.2  HCT 43.9 42.3  MCV 94.8 94.2  PLT 183 207   Cardiac Enzymes:  Recent Labs Lab 12/11/14 0647 12/11/14 1313 12/11/14 1902  TROPONINI <0.03 <0.03 <0.03    Signed:  Madolyn Ackroyd  [facility] 12/11/2014, 8:49 PM

## 2014-12-11 NOTE — Progress Notes (Signed)
  Echocardiogram 2D Echocardiogram has been performed.  Aris EvertsRix, Levita Monical A 12/11/2014, 3:17 PM
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
## 2014-12-11 NOTE — ED Notes (Signed)
Left side chest pain onset this evening, given aspirin 324 and 1 nitro for pain per ems

## 2014-12-11 NOTE — Progress Notes (Signed)
Discharge instructions reviewed with patient and spouse, understanding verbalized, discharged ambulatory accompanied by spouse and Kathie RhodesBetty, [employer] operational officerecretary.

## 2014-12-11 NOTE — H&P (Signed)
[facility] History and Physical  Terrence Strickland Diana ZOX:[id number] DOB: 01/23/1966 DOA: 12/11/2014  Referring physician: Dr.Wickline. PCP: Isabella StallingNDIEGO,RICHARD M, MD  Specialists: None.  Chief Complaint: Chest pain.  HPI: Terrence Strickland Maybee is a 49 y.o. male with history of tobacco abuse and chronic back pain presents to the ER because of chest pain. Patient states patient has been experiencing chest pain off and on last 2 days. Pain lasts for less than a minute each time. Patient's chest pain is left-sided and is sharp stabbing in nature. Denies any radiation or any association with exertion or shortness of breath. Denies any productive cough fever chills. EKG in the ER showed features comparable to the old EKG and troponins were negative. Presently patient with chest pain free.   Review of Systems: As presented in the history of presenting illness, rest negative.  Past Medical History  Diagnosis Date  . Depression   . ADHD (attention deficit hyperactivity disorder)   . Irregular heart beat   . Alcohol abuse    History reviewed. No pertinent past surgical history. Social History:  reports that he has been smoking Cigarettes.  He has a 7.5 pack-year smoking history. He has never used smokeless tobacco. He reports that he drinks about 1.2 oz of alcohol per week. He reports that he does not use illicit drugs. Where does patient live home. Can patient participate in ADLs? Yes.  Allergies  Allergen Reactions  . Nsaids Other (See Comments)    hallucinations  . Tramadol Other (See Comments)    Chills, hallucinations, sweating      Family History:  Family History  Problem Relation Age of Onset  . Cancer    . Diabetes        Prior to Admission medications   Medication Sig Start Date End Date Taking? Authorizing Provider  oxyCODONE (ROXICODONE) 5 MG immediate release tablet Take 1 tablet (5 mg total) by mouth every 4 (four) hours as needed for severe pain. 05/22/14  Yes Dione Boozeavid Glick, MD     Physical Exam: Filed Vitals:   12/11/14 0013 12/11/14 0231 12/11/14 0300  BP: 102/58 112/67 98/61  Pulse: 68 65 63  Temp: 98.1 F (36.7 C)    TempSrc: Oral    Resp: 20 18 15   Height: 5\' 11"  (1.803 m)    Weight: 77.111 kg (170 lb)    SpO2: 100% 96% 97%     General:  Moderately built and nourished.  Eyes: Anicteric no pallor.  ENT: No discharge from the ears eyes nose or mouth.  Neck: No mass felt.  Cardiovascular: S1-S2 heard.  Respiratory: No rhonchi or crepitations.  Abdomen: Soft nontender bowel sounds present.  Skin: No rash.  Musculoskeletal: No edema.  Psychiatric: Appears normal.  Neurologic: Alert awake oriented to time place and person. Moves all extremities.  Labs on Admission:  Basic Metabolic Panel:  Recent Labs Lab 12/11/14 0058  NA 136  K 3.6  CL 101  CO2 24  GLUCOSE 94  BUN 14  CREATININE 0.99  CALCIUM 8.5*   Liver Function Tests:  Recent Labs Lab 12/11/14 0058  AST 41  ALT 24  ALKPHOS 54  BILITOT 0.9  PROT 7.5  ALBUMIN 3.9    Recent Labs Lab 12/11/14 0058  LIPASE 17*   No results for input(s): AMMONIA in the last 168 hours. CBC:  Recent Labs Lab 12/11/14 0058  WBC 6.3  NEUTROABS 3.9  HGB 14.9  HCT 43.9  MCV 94.8  PLT 183   Cardiac  Enzymes: No results for input(s): CKTOTAL, CKMB, CKMBINDEX, TROPONINI in the last 168 hours.  BNP (last 3 results) No results for input(s): BNP in the last 8760 hours.  ProBNP (last 3 results) No results for input(s): PROBNP in the last 8760 hours.  CBG: No results for input(s): GLUCAP in the last 168 hours.  Radiological Exams on Admission: Dg Chest 2 View  12/11/2014   CLINICAL DATA:  Left-sided chest pain.  Recent fall.  EXAM: CHEST  2 VIEW  COMPARISON:  05/17/2013  FINDINGS: Normal heart size and mediastinal contours. No acute infiltrate or edema. No effusion or pneumothorax. No acute osseous findings.  IMPRESSION: No active cardiopulmonary disease.   Electronically  Signed   By: Marnee Spring M.D.   On: 12/11/2014 01:28   Dg Ankle Complete Left  12/11/2014   CLINICAL DATA:  Generalized left ankle pain after fall. Initial encounter.  EXAM: LEFT ANKLE COMPLETE - 3+ VIEW  COMPARISON:  None.  FINDINGS: There is no evidence of fracture or dislocation. The ankle mortise is intact; the interosseous space is within normal limits. No talar tilt or subluxation is seen.  The joint spaces are preserved. No significant soft tissue abnormalities are seen.  IMPRESSION: No evidence of fracture or dislocation.   Electronically Signed   By: Roanna Raider M.D.   On: 12/11/2014 01:29    EKG: Independently reviewed. Normal sinus rhythm with ST changes comparable to old EKG.  Assessment/Plan Principal Problem:   Chest pain Active Problems:   Backache   1. Chest pain - appears atypical. Presently chest pain-free. We'll cycle cardiac markers check 2-D echo check d-dimer. Aspirin. Check d-dimer. 2. Chronic back pain - continue present medications. 3. Tobacco abuse - patient advised to quit tobacco use.   DVT Prophylaxis Lovenox.  Code Status: Full code.  Family Communication: Discussed with patient. Disposition Plan: Admit for observation.    KAKRAKANDY,ARSHAD N. [facility] Pager [phone].  If 7PM-7AM, please contact night-coverage [email or web address] Password [email or web address] 12/11/2014, 4:22 AM

## 2014-12-11 NOTE — ED Notes (Signed)
Pt given water, peanut butter, and graham crackers.

## 2014-12-11 NOTE — Progress Notes (Signed)
Central Telemetry called, telemetry removed for discharge home.  IV removed.

## 2014-12-23 ENCOUNTER — Emergency Department (HOSPITAL_COMMUNITY)
Admission: EM | Admit: 2014-12-23 | Discharge: 2014-12-23 | Discharge status: Against Medical Advice | Payer: Medicaid Other | Attending: Emergency Medicine | Admitting: Emergency Medicine

## 2014-12-23 ENCOUNTER — Encounter (HOSPITAL_COMMUNITY): Payer: Self-pay

## 2014-12-23 DIAGNOSIS — Y929 Unspecified place or not applicable: Secondary | ICD-10-CM | POA: Diagnosis not present

## 2014-12-23 DIAGNOSIS — Z043 Encounter for examination and observation following other accident: Secondary | ICD-10-CM | POA: Insufficient documentation

## 2014-12-23 DIAGNOSIS — Y939 Activity, unspecified: Secondary | ICD-10-CM | POA: Insufficient documentation

## 2014-12-23 DIAGNOSIS — Y999 Unspecified external cause status: Secondary | ICD-10-CM | POA: Insufficient documentation

## 2014-12-23 DIAGNOSIS — Z720 Tobacco use: Secondary | ICD-10-CM | POA: Insufficient documentation
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
## 2015-01-08 ENCOUNTER — Emergency Department (HOSPITAL_COMMUNITY)
Admission: EM | Admit: 2015-01-08 | Discharge: 2015-01-08 | Disposition: A | Discharge status: Home / Self Care | Payer: Medicaid Other | Attending: Emergency Medicine | Admitting: Emergency Medicine

## 2015-01-08 ENCOUNTER — Encounter (HOSPITAL_COMMUNITY): Payer: Self-pay

## 2015-01-08 DIAGNOSIS — Z7982 Long term (current) use of aspirin: Secondary | ICD-10-CM | POA: Insufficient documentation

## 2015-01-08 DIAGNOSIS — Z720 Tobacco use: Secondary | ICD-10-CM | POA: Diagnosis not present

## 2015-01-08 DIAGNOSIS — Y9289 Other specified places as the place of occurrence of the external cause: Secondary | ICD-10-CM | POA: Insufficient documentation

## 2015-01-08 DIAGNOSIS — Z8679 Personal history of other diseases of the circulatory system: Secondary | ICD-10-CM | POA: Insufficient documentation

## 2015-01-08 DIAGNOSIS — S81812A Laceration without foreign body, left lower leg, initial encounter: Secondary | ICD-10-CM | POA: Diagnosis present

## 2015-01-08 DIAGNOSIS — Y998 Other external cause status: Secondary | ICD-10-CM | POA: Insufficient documentation

## 2015-01-08 DIAGNOSIS — Y9389 Activity, other specified: Secondary | ICD-10-CM | POA: Insufficient documentation

## 2015-01-08 DIAGNOSIS — Z8659 Personal history of other mental and behavioral disorders: Secondary | ICD-10-CM | POA: Diagnosis not present

## 2015-01-08 DIAGNOSIS — S61412A Laceration without foreign body of left hand, initial encounter: Secondary | ICD-10-CM | POA: Diagnosis not present

## 2015-01-08 DIAGNOSIS — T07XXXA Unspecified multiple injuries, initial encounter: Secondary | ICD-10-CM

## 2015-01-08 MED ORDER — BACITRACIN ZINC 500 UNIT/GM EX OINT
TOPICAL_OINTMENT | CUTANEOUS | Status: AC
  Filled 2015-01-08: qty 2.7
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
## 2015-01-08 NOTE — Discharge Instructions (Signed)
Laceration Care, Adult °A laceration is a cut or lesion that goes through all layers of the skin and into the tissue just beneath the skin. °TREATMENT  °Some lacerations may not require closure. Some lacerations may not be able to be closed due to an increased risk of infection. It is important to see your caregiver as soon as possible after an injury to minimize the risk of infection and maximize the opportunity for successful closure. °If closure is appropriate, pain medicines may be given, if needed. The wound will be cleaned to help prevent infection. Your caregiver will use stitches (sutures), staples, wound glue (adhesive), or skin adhesive strips to repair the laceration. These tools bring the skin edges together to allow for faster healing and a better cosmetic outcome. However, all wounds will heal with a scar. Once the wound has healed, scarring can be minimized by covering the wound with sunscreen during the day for 1 full year. °HOME CARE INSTRUCTIONS  °For sutures or staples: °· Keep the wound clean and dry. °· If you were given a bandage (dressing), you should change it at least once a day. Also, change the dressing if it becomes wet or dirty, or as directed by your caregiver. °· Wash the wound with soap and water 2 times a day. Rinse the wound off with water to remove all soap. Pat the wound dry with a clean towel. °· After cleaning, apply a thin layer of the antibiotic ointment as recommended by your caregiver. This will help prevent infection and keep the dressing from sticking. °· You may shower as usual after the first 24 hours. Do not soak the wound in water until the sutures are removed. °· Only take over-the-counter or prescription medicines for pain, discomfort, or fever as directed by your caregiver. °· Get your sutures or staples removed as directed by your caregiver. °For skin adhesive strips: °· Keep the wound clean and dry. °· Do not get the skin adhesive strips wet. You may bathe  carefully, using caution to keep the wound dry. °· If the wound gets wet, pat it dry with a clean towel. °· Skin adhesive strips will fall off on their own. You may trim the strips as the wound heals. Do not remove skin adhesive strips that are still stuck to the wound. They will fall off in time. °For wound adhesive: °· You may briefly wet your wound in the shower or bath. Do not soak or scrub the wound. Do not swim. Avoid periods of heavy perspiration until the skin adhesive has fallen off on its own. After showering or bathing, gently pat the wound dry with a clean towel. °· Do not apply liquid medicine, cream medicine, or ointment medicine to your wound while the skin adhesive is in place. This may loosen the film before your wound is healed. °· If a dressing is placed over the wound, be careful not to apply tape directly over the skin adhesive. This may cause the adhesive to be pulled off before the wound is healed. °· Avoid prolonged exposure to sunlight or tanning lamps while the skin adhesive is in place. Exposure to ultraviolet light in the first year will darken the scar. °· The skin adhesive will usually remain in place for 5 to 10 days, then naturally fall off the skin. Do not pick at the adhesive film. °You may need a tetanus shot if: °· You cannot remember when you had your last tetanus shot. °· You have never had a tetanus   shot. °If you get a tetanus shot, your arm may swell, get red, and feel warm to the touch. This is common and not a problem. If you need a tetanus shot and you choose not to have one, there is a rare chance of getting tetanus. Sickness from tetanus can be serious. °SEEK MEDICAL CARE IF:  °· You have redness, swelling, or increasing pain in the wound. °· You see a red line that goes away from the wound. °· You have yellowish-white fluid (pus) coming from the wound. °· You have a fever. °· You notice a bad smell coming from the wound or dressing. °· Your wound breaks open before or  after sutures have been removed. °· You notice something coming out of the wound such as wood or glass. °· Your wound is on your hand or foot and you cannot move a finger or toe. °SEEK IMMEDIATE MEDICAL CARE IF:  °· Your pain is not controlled with prescribed medicine. °· You have severe swelling around the wound causing pain and numbness or a change in color in your arm, hand, leg, or foot. °· Your wound splits open and starts bleeding. °· You have worsening numbness, weakness, or loss of function of any joint around or beyond the wound. °· You develop painful lumps near the wound or on the skin anywhere on your body. °MAKE SURE YOU:  °· Understand these instructions. °· Will watch your condition. °· Will get help right away if you are not doing well or get worse. °Document Released: 05/21/2005 Document Revised: 08/13/2011 Document Reviewed: 11/14/2010 °[employer]® Patient Information ©2015 [employer]. This information is not intended to replace advice given to you by your health care provider. Make sure you discuss any questions you have with your health care provider. ° °Assault, General °Assault includes any behavior, whether intentional or reckless, which results in bodily injury to another person and/or damage to property. Included in this would be any behavior, intentional or reckless, that by its nature would be understood (interpreted) by a reasonable person as intent to harm another person or to damage his/her property. Threats may be oral or written. They may be communicated through regular mail, computer, fax, or phone. These threats may be direct or implied. °FORMS OF ASSAULT INCLUDE: °· Physically assaulting a person. This includes physical threats to inflict physical harm as well as: °¨ Slapping. °¨ Hitting. °¨ Poking. °¨ Kicking. °¨ Punching. °¨ Pushing. °· Arson. °· Sabotage. °· Equipment vandalism. °· Damaging or destroying property. °· Throwing or hitting objects. °· Displaying a weapon or an  object that appears to be a weapon in a threatening manner. °¨ Carrying a firearm of any kind. °¨ Using a weapon to harm someone. °· Using greater physical size/strength to intimidate another. °¨ Making intimidating or threatening gestures. °¨ Bullying. °¨ Hazing. °· Intimidating, threatening, hostile, or abusive language directed toward another person. °¨ It communicates the intention to engage in violence against that person. And it leads a reasonable person to expect that violent behavior may occur. °· Stalking another person. °IF IT HAPPENS AGAIN: °· Immediately call for emergency help (911 in [place] °· If someone poses clear and immediate danger to you, seek legal authorities to have a protective or restraining order put in place. °· Less threatening assaults can at least be reported to authorities. °STEPS TO TAKE IF A SEXUAL ASSAULT HAS HAPPENED °· Go to an area of safety. This may include a shelter or staying with a friend. Stay away from   the area where you have been attacked. A large percentage of sexual assaults are caused by a friend, relative or associate.  If medications were given by your caregiver, take them as directed for the full length of time prescribed.  Only take over-the-counter or prescription medicines for pain, discomfort, or fever as directed by your caregiver.  If you have come in contact with a sexual disease, find out if you are to be tested again. If your caregiver is concerned about the HIV/AIDS virus, he/she may require you to have continued testing for several months.  For the protection of your privacy, test results can not be given over the phone. Make sure you receive the results of your test. If your test results are not back during your visit, make an appointment with your caregiver to find out the results. Do not assume everything is normal if you have not heard from your caregiver or the medical facility. It is important for you to follow up on all of your test  results.  File appropriate papers with authorities. This is important in all assaults, even if it has occurred in a family or by a friend. SEEK MEDICAL CARE IF:  You have new problems because of your injuries.  You have problems that may be because of the medicine you are taking, such as:  Rash.  Itching.  Swelling.  Trouble breathing.  You develop belly (abdominal) pain, feel sick to your stomach (nausea) or are vomiting.  You begin to run a temperature.  You need supportive care or referral to a rape crisis center. These are centers with trained personnel who can help you get through this ordeal. SEEK IMMEDIATE MEDICAL CARE IF:  You are afraid of being threatened, beaten, or abused. In [place] call 911.  You receive new injuries related to abuse.  You develop severe pain in any area injured in the assault or have any change in your condition that concerns you.  You faint or lose consciousness.  You develop chest pain or shortness of breath. Document Released: 05/21/2005 Document Revised: 08/13/2011 Document Reviewed: 01/07/2008 [facility] Patient Information 2015 [place], [place]. This information is not intended to replace advice given to you by your health care provider. Make sure you discuss any questions you have with your health care provider.
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
## 2015-01-18 ENCOUNTER — Emergency Department (HOSPITAL_COMMUNITY)
Admission: EM | Admit: 2015-01-18 | Discharge: 2015-01-18 | Disposition: A | Discharge status: Home / Self Care | Payer: Medicaid Other | Attending: Emergency Medicine | Admitting: Emergency Medicine

## 2015-01-18 ENCOUNTER — Encounter (HOSPITAL_COMMUNITY): Payer: Self-pay | Admitting: Emergency Medicine

## 2015-01-18 DIAGNOSIS — Z720 Tobacco use: Secondary | ICD-10-CM | POA: Insufficient documentation

## 2015-01-18 DIAGNOSIS — Z8659 Personal history of other mental and behavioral disorders: Secondary | ICD-10-CM | POA: Diagnosis not present

## 2015-01-18 DIAGNOSIS — Z7982 Long term (current) use of aspirin: Secondary | ICD-10-CM | POA: Diagnosis not present

## 2015-01-18 DIAGNOSIS — Z4802 Encounter for removal of sutures: Secondary | ICD-10-CM | POA: Insufficient documentation

## 2015-01-18 NOTE — ED Notes (Signed)
Staples and sutures removed.  Sutures to left hand and staples to left lower leg.

## 2015-01-18 NOTE — Discharge Instructions (Signed)
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
## 2015-01-18 NOTE — ED Notes (Signed)
Stitches and staples removed per Sherry Ruffing order

## 2015-08-21 ENCOUNTER — Emergency Department (HOSPITAL_COMMUNITY): Payer: Medicaid Other

## 2015-08-21 ENCOUNTER — Encounter (HOSPITAL_COMMUNITY): Payer: Self-pay | Admitting: *Deleted

## 2015-08-21 ENCOUNTER — Emergency Department (HOSPITAL_COMMUNITY)
Admission: EM | Admit: 2015-08-21 | Discharge: 2015-08-21 | Disposition: A | Discharge status: Home / Self Care | Payer: Medicaid Other | Attending: Emergency Medicine | Admitting: Emergency Medicine

## 2015-08-21 DIAGNOSIS — F329 Major depressive disorder, single episode, unspecified: Secondary | ICD-10-CM | POA: Diagnosis not present

## 2015-08-21 DIAGNOSIS — R079 Chest pain, unspecified: Secondary | ICD-10-CM

## 2015-08-21 DIAGNOSIS — F17210 Nicotine dependence, cigarettes, uncomplicated: Secondary | ICD-10-CM | POA: Diagnosis not present

## 2015-08-21 DIAGNOSIS — R0789 Other chest pain: Secondary | ICD-10-CM | POA: Diagnosis not present

## 2015-08-21 LAB — TROPONIN I
Troponin I: <0.03 ng/mL (ref <0.031)
Troponin I: <0.03 ng/mL (ref <0.031)

## 2015-08-21 MED ORDER — ASPIRIN 81 MG PO CHEW
324.0000 mg | CHEWABLE_TABLET | Freq: Once | ORAL | Status: AC
  Administered 2015-08-21: 324 mg via ORAL
  Filled 2015-08-21: qty 4

## 2015-08-21 NOTE — ED Notes (Signed)
Pt c/o mid center chest pain that started yesterday, is described as sharp and is associated with nausea,

## 2015-08-21 NOTE — ED Provider Notes (Signed)
CSN: [id number]     Arrival date & time 08/21/15  0139 History   First MD Initiated Contact with Patient 08/21/15 0150     Chief Complaint  Patient presents with  . Chest Pain    Patient is a 50 y.o. male presenting with chest pain. The history is provided by the patient.  Chest Pain Pain location:  R chest Pain quality: sharp   Pain radiates to:  Upper back Pain severity:  Moderate Onset quality:  Sudden Duration:  1 day Timing:  Intermittent Progression:  Improving Chronicity:  New Relieved by:  Nothing Worsened by:  Deep breathing, movement and certain positions Associated symptoms: shortness of breath   Associated symptoms: no cough, no fever, no lower extremity edema and not vomiting   Associated symptoms comment:  Denies hemoptysis  Risk factors: smoking   Risk factors: no coronary artery disease, no diabetes mellitus, no hypertension and no prior DVT/PE   PT reports over past day he has had multiple episodes of right sided sharp CP It last less than one minute It occurs at rest, sometimes with walking He reports SOB as well He is now feeling improved Denies h/o CAD/DVT/PE He is a smoker  Family history  -negative for CAD  Past Medical History  Diagnosis Date  . Depression   . ADHD (attention deficit hyperactivity disorder)   . Irregular heart beat   . Alcohol abuse    History reviewed. No pertinent past surgical history. Family History  Problem Relation Age of Onset  . Cancer    . Diabetes     Social History  Substance Use Topics  . Smoking status: Current Every Day Smoker -- 0.50 packs/day for 15 years    Types: Cigarettes  . Smokeless tobacco: Never Used  . Alcohol Use: 1.2 oz/week    2 Cans of beer per week     Comment: weekends    Review of Systems  Constitutional: Negative for fever.  Respiratory: Positive for shortness of breath. Negative for cough.   Cardiovascular: Positive for chest pain.  Gastrointestinal: Negative for vomiting.  All  other systems reviewed and are negative.     Allergies  Nsaids and Tramadol  Home Medications   Prior to Admission medications   Medication Sig Start Date End Date Taking? Authorizing Provider  oxyCODONE (ROXICODONE) 5 MG immediate release tablet Take 1 tablet (5 mg total) by mouth every 4 (four) hours as needed for severe pain. 05/22/14  Yes Dione Boozeavid Glick, MD  aspirin EC 325 MG EC tablet Take 1 tablet (325 mg total) by mouth daily. 12/11/14   Houston SirenPeter Le, MD   BP 153/104 mmHg  Pulse 67  Temp(Src) 98.2 F (36.8 C) (Oral)  Resp 16  Ht 5\' 11"  (1.803 m)  Wt 79.379 kg  BMI 24.42 kg/m2  SpO2 98% Physical Exam CONSTITUTIONAL: Disheveled HEAD: Normocephalic/atraumatic EYES: EOMI/PERRL ENMT: Mucous membranes moist NECK: supple no meningeal signs SPINE/BACK:entire spine nontender CV: S1/S2 noted, no murmurs/rubs/gallops noted Chest - mild tenderness to palpation of right chest.  He has some pain with movement in bed LUNGS: Lungs are clear to auscultation bilaterally, no apparent distress ABDOMEN: soft, nontender, no rebound or guarding, bowel sounds noted throughout abdomen NEURO: Pt is awake/alert/appropriate, moves all extremitiesx4.  No facial droop.   EXTREMITIES: pulses normal/equal, full ROM, no calf tenderness/edema noted SKIN: warm, color normal PSYCH: no abnormalities of mood noted, alert and oriented to situation  ED Course  Procedures  2:05 AM HEART score = 3 (sharp  CP, he has early repol on EKG, smoker) He is a poor historian, at times he reports right sided sharp CP, at times worse with position/palpation, other times reports it occurred while walking He had stress imaging in 2016 that was unremarkable Will perform rapid rule out in the ED 3:26 AM Initial testing unremarkable Pt resting comfortably He reports brief episode of CP earlier but none at this time Will check repeat troponin/ekg at 3 hr mark 6:12 AM Pt resting comfortably No recurrent CP No hypoxia or  tachycardia to suggest acute PE I doubt aortic dissection given history/exam and clinical stability  Labs Review Labs Reviewed  TROPONIN I  TROPONIN I    Imaging Review Dg Chest 2 View  08/21/2015  CLINICAL DATA:  Right-sided chest pain, onset yesterday EXAM: CHEST  2 VIEW COMPARISON:  12/11/2014 FINDINGS: The heart size and mediastinal contours are within normal limits. Both lungs are clear. The visualized skeletal structures are unremarkable. IMPRESSION: No active cardiopulmonary disease. Electronically Signed   By: Ellery Plunk M.D.   On: 08/21/2015 02:53   I have personally reviewed and evaluated these images and lab results as part of my medical decision-making.   EKG Interpretation   Date/Time:  Sunday August 21 2015 01:49:33 EDT Ventricular Rate:  71 PR Interval:  153 QRS Duration: 91 QT Interval:  431 QTC Calculation: 468 R Axis:   61 Text Interpretation:  Sinus rhythm Left ventricular hypertrophy ST elev,  probable normal early repol pattern No significant change since last  tracing Confirmed by [facility]  MD, Ilisa Hayworth ([id number]) on 08/21/2015 1:53:28 AM     Medications  aspirin chewable tablet 324 mg (324 mg Oral Given 08/21/15 0211)    EKG Interpretation  Date/Time:  Sunday August 21 2015 05:20:47 EDT Ventricular Rate:  65 PR Interval:  156 QRS Duration: 94 QT Interval:  465 QTC Calculation: 483 R Axis:   84 Text Interpretation:  Sinus rhythm Consider left atrial enlargement Left ventricular hypertrophy Early repolarization Confirmed by [facility]aggy  MD, Dorinda Hill ([id number]) on 08/21/2015 5:36:36 AM        MDM   Final diagnoses:  Right-sided chest pain  Acute chest pain    Nursing notes including past medical history and social history reviewed and considered in documentation xrays/imaging reviewed by myself and considered during evaluation Labs/vital reviewed myself and considered during evaluation Previous records reviewed and considered     Zadie Rhine, MD 08/21/15 [phone]

## 2015-08-21 NOTE — Discharge Instructions (Signed)

## 2015-10-16 ENCOUNTER — Emergency Department (HOSPITAL_COMMUNITY): Payer: Medicaid Other

## 2015-10-16 ENCOUNTER — Emergency Department (HOSPITAL_COMMUNITY)
Admission: EM | Admit: 2015-10-16 | Discharge: 2015-10-16 | Disposition: A | Discharge status: Home / Self Care | Payer: Medicaid Other | Attending: Emergency Medicine | Admitting: Emergency Medicine

## 2015-10-16 ENCOUNTER — Encounter (HOSPITAL_COMMUNITY): Payer: Self-pay | Admitting: Emergency Medicine

## 2015-10-16 DIAGNOSIS — F329 Major depressive disorder, single episode, unspecified: Secondary | ICD-10-CM | POA: Insufficient documentation

## 2015-10-16 DIAGNOSIS — Z79899 Other long term (current) drug therapy: Secondary | ICD-10-CM | POA: Diagnosis not present

## 2015-10-16 DIAGNOSIS — F17210 Nicotine dependence, cigarettes, uncomplicated: Secondary | ICD-10-CM | POA: Insufficient documentation

## 2015-10-16 DIAGNOSIS — R079 Chest pain, unspecified: Secondary | ICD-10-CM | POA: Diagnosis present

## 2015-10-16 DIAGNOSIS — R112 Nausea with vomiting, unspecified: Secondary | ICD-10-CM | POA: Diagnosis not present

## 2015-10-16 DIAGNOSIS — F909 Attention-deficit hyperactivity disorder, unspecified type: Secondary | ICD-10-CM | POA: Insufficient documentation

## 2015-10-16 DIAGNOSIS — R109 Unspecified abdominal pain: Secondary | ICD-10-CM | POA: Diagnosis not present

## 2015-10-16 LAB — URINALYSIS, ROUTINE W REFLEX MICROSCOPIC
Bilirubin Urine: NEGATIVE
GLUCOSE, UA: NEGATIVE mg/dL
KETONES UR: 15 mg/dL — AB
Leukocytes, UA: NEGATIVE
Nitrite: NEGATIVE
PROTEIN: 30 mg/dL — AB
Specific Gravity, Urine: 1.025 (ref 1.005–1.030)
pH: 5.5 (ref 5.0–8.0)

## 2015-10-16 LAB — RAPID URINE DRUG SCREEN, HOSP PERFORMED
AMPHETAMINES: NOT DETECTED
Barbiturates: NOT DETECTED
Benzodiazepines: NOT DETECTED
Cocaine: NOT DETECTED
OPIATES: NOT DETECTED
TETRAHYDROCANNABINOL: POSITIVE — AB

## 2015-10-16 LAB — COMPREHENSIVE METABOLIC PANEL
ALBUMIN: 4.4 g/dL (ref 3.5–5.0)
ALT: 19 U/L (ref 17–63)
AST: 34 U/L (ref 15–41)
Alkaline Phosphatase: 68 U/L (ref 38–126)
Anion gap: 11 (ref 5–15)
BILIRUBIN TOTAL: 0.8 mg/dL (ref 0.3–1.2)
BUN: 12 mg/dL (ref 6–20)
CALCIUM: 9.1 mg/dL (ref 8.9–10.3)
CHLORIDE: 103 mmol/L (ref 101–111)
CO2: 22 mmol/L (ref 22–32)
CREATININE: 0.78 mg/dL (ref 0.61–1.24)
GFR calc Af Amer: >60 mL/min (ref >60)
Glucose, Bld: 69 mg/dL (ref 65–99)
Potassium: 3.8 mmol/L (ref 3.5–5.1)
Sodium: 136 mmol/L (ref 135–145)
TOTAL PROTEIN: 8.7 g/dL — AB (ref 6.5–8.1)

## 2015-10-16 LAB — CBC
HCT: 49.4 % (ref 39.0–52.0)
Hemoglobin: 16.5 g/dL (ref 13.0–17.0)
MCH: 31.3 pg (ref 26.0–34.0)
MCHC: 33.4 g/dL (ref 30.0–36.0)
MCV: 93.6 fL (ref 78.0–100.0)
Platelets: 293 10*3/uL (ref 150–400)
RBC: 5.28 MIL/uL (ref 4.22–5.81)
RDW: 14.2 % (ref 11.5–15.5)
WBC: 10.3 10*3/uL (ref 4.0–10.5)

## 2015-10-16 LAB — ETHANOL: Alcohol, Ethyl (B): 31 mg/dL — ABNORMAL HIGH (ref <5)

## 2015-10-16 LAB — PROTIME-INR
INR: 1.09 (ref 0.00–1.49)
PROTHROMBIN TIME: 14.3 s (ref 11.6–15.2)

## 2015-10-16 LAB — URINE MICROSCOPIC-ADD ON

## 2015-10-16 LAB — LIPASE, BLOOD: Lipase: 15 U/L (ref 11–51)

## 2015-10-16 LAB — TROPONIN I: Troponin I: <0.03 ng/mL (ref <0.031)

## 2015-10-16 LAB — D-DIMER, QUANTITATIVE: D-Dimer, Quant: 0.6 ug/mL-FEU — ABNORMAL HIGH (ref 0.00–0.50)

## 2015-10-16 MED ORDER — SODIUM CHLORIDE 0.9 % IV BOLUS (SEPSIS)
1000.0000 mL | Freq: Once | INTRAVENOUS | Status: AC
  Administered 2015-10-16: 1000 mL via INTRAVENOUS

## 2015-10-16 MED ORDER — IOPAMIDOL (ISOVUE-370) INJECTION 76%
100.0000 mL | Freq: Once | INTRAVENOUS | Status: AC | PRN
  Administered 2015-10-16: 100 mL via INTRAVENOUS

## 2015-10-16 MED ORDER — SODIUM CHLORIDE 0.9 % IV SOLN
INTRAVENOUS | Status: DC
  Administered 2015-10-16: 15:00:00 via INTRAVENOUS
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
## 2015-10-16 NOTE — Discharge Instructions (Signed)
Chest Wall Pain °Chest wall pain is pain in or around the bones and muscles of your chest. Sometimes, an injury causes this pain. Sometimes, the cause may not be known. This pain may take several weeks or longer to get better. °HOME CARE °Pay attention to any changes in your symptoms. Take these actions to help with your pain: °· Rest as told by your doctor. °· Avoid activities that cause pain. Try not to use your chest, belly (abdominal), or side muscles to lift heavy things. °· If directed, apply ice to the painful area: °¨ Put ice in a plastic bag. °¨ Place a towel between your skin and the bag. °¨ Leave the ice on for 20 minutes, 2-3 times per day. °· Take over-the-counter and prescription medicines only as told by your doctor. °· Do not use tobacco products, including cigarettes, chewing tobacco, and e-cigarettes. If you need help quitting, ask your doctor. °· Keep all follow-up visits as told by your doctor. This is important. °GET HELP IF: °· You have a fever. °· Your chest pain gets worse. °· You have new symptoms. °GET HELP RIGHT AWAY IF: °· You feel sick to your stomach (nauseous) or you throw up (vomit). °· You feel sweaty or light-headed. °· You have a cough with phlegm (sputum) or you cough up blood. °· You are short of breath. °  °This information is not intended to replace advice given to you by your health care provider. Make sure you discuss any questions you have with your health care provider. °  °Document Released: 11/07/2007 Document Revised: 02/09/2015 Document Reviewed: 08/16/2014 °Elsevier Interactive Patient Education ©2016 [employer] ° °

## 2015-10-16 NOTE — ED Notes (Signed)
Patient states his chest started hurting today around noon and walked to hospital. Patient states pressure in left side of chest with nausea and vomiting x 1 today.

## 2016-01-13 ENCOUNTER — Emergency Department (HOSPITAL_COMMUNITY): Payer: Medicaid Other

## 2016-01-13 ENCOUNTER — Encounter (HOSPITAL_COMMUNITY): Payer: Self-pay | Admitting: Cardiology

## 2016-01-13 ENCOUNTER — Emergency Department (HOSPITAL_COMMUNITY)
Admission: EM | Admit: 2016-01-13 | Discharge: 2016-01-13 | Disposition: A | Discharge status: Home / Self Care | Payer: Medicaid Other | Attending: Emergency Medicine | Admitting: Emergency Medicine

## 2016-01-13 DIAGNOSIS — F909 Attention-deficit hyperactivity disorder, unspecified type: Secondary | ICD-10-CM | POA: Insufficient documentation

## 2016-01-13 DIAGNOSIS — Y929 Unspecified place or not applicable: Secondary | ICD-10-CM | POA: Insufficient documentation

## 2016-01-13 DIAGNOSIS — Y999 Unspecified external cause status: Secondary | ICD-10-CM | POA: Insufficient documentation

## 2016-01-13 DIAGNOSIS — T148XXA Other injury of unspecified body region, initial encounter: Secondary | ICD-10-CM

## 2016-01-13 DIAGNOSIS — F17210 Nicotine dependence, cigarettes, uncomplicated: Secondary | ICD-10-CM | POA: Insufficient documentation

## 2016-01-13 DIAGNOSIS — S51801A Unspecified open wound of right forearm, initial encounter: Secondary | ICD-10-CM | POA: Insufficient documentation

## 2016-01-13 DIAGNOSIS — S60811A Abrasion of right wrist, initial encounter: Secondary | ICD-10-CM | POA: Insufficient documentation

## 2016-01-13 DIAGNOSIS — W260XXA Contact with knife, initial encounter: Secondary | ICD-10-CM | POA: Insufficient documentation

## 2016-01-13 DIAGNOSIS — S51811A Laceration without foreign body of right forearm, initial encounter: Secondary | ICD-10-CM

## 2016-01-13 DIAGNOSIS — Y939 Activity, unspecified: Secondary | ICD-10-CM | POA: Insufficient documentation

## 2016-01-13 DIAGNOSIS — S60812A Abrasion of left wrist, initial encounter: Secondary | ICD-10-CM | POA: Insufficient documentation

## 2016-01-13 LAB — I-STAT CHEM 8, ED
BUN: 11 mg/dL (ref 6–20)
CALCIUM ION: 1.18 mmol/L (ref 1.13–1.30)
CHLORIDE: 105 mmol/L (ref 101–111)
Creatinine, Ser: 1 mg/dL (ref 0.61–1.24)
Glucose, Bld: 88 mg/dL (ref 65–99)
HEMATOCRIT: 53 % — AB (ref 39.0–52.0)
Hemoglobin: 18 g/dL — ABNORMAL HIGH (ref 13.0–17.0)
Potassium: 3.9 mmol/L (ref 3.5–5.1)
SODIUM: 141 mmol/L (ref 135–145)
TCO2: 21 mmol/L (ref 0–100)

## 2016-01-13 LAB — I-STAT TROPONIN, ED: TROPONIN I, POC: 0.01 ng/mL (ref 0.00–0.08)

## 2016-01-13 MED ORDER — IOPAMIDOL (ISOVUE-370) INJECTION 76%
100.0000 mL | Freq: Once | INTRAVENOUS | Status: AC | PRN
  Administered 2016-01-13: 100 mL via INTRAVENOUS

## 2016-01-13 MED ORDER — ACETAMINOPHEN 325 MG PO TABS
650.0000 mg | ORAL_TABLET | Freq: Once | ORAL | Status: AC
  Administered 2016-01-13: 650 mg via ORAL
  Filled 2016-01-13: qty 2

## 2016-01-13 MED ORDER — BACITRACIN ZINC 500 UNIT/GM EX OINT
TOPICAL_OINTMENT | Freq: Once | CUTANEOUS | Status: AC
  Administered 2016-01-13: 2 via TOPICAL
  Filled 2016-01-13: qty 1.8

## 2016-01-13 MED ORDER — TETANUS-DIPHTH-ACELL PERTUSSIS 5-2.5-18.5 LF-MCG/0.5 IM SUSP: 0.5000 mL | Freq: Once | INTRAMUSCULAR | Status: DC
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
## 2016-01-13 NOTE — Discharge Instructions (Signed)
Keep your wound clean and dry. Wash it daily, apply bacitracin and cover with a clean bandage. Follow up with your primary care provider in 2-3 days to have your wound rechecked.   Return to the emergency department if you experience signs of infection to include increased redness, warmth, swelling, foul drainage, fever or chills. Also return immediately with increased swelling, pain, numbness of your hand, your fingers become cold or blue. These are signs of compartment syndrome.

## 2016-01-13 NOTE — ED Notes (Signed)
Open areas cleaned to right forearm, right knuckle and left wrist area

## 2016-01-13 NOTE — ED Notes (Signed)
Returned from CT.
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
## 2016-06-05 ENCOUNTER — Emergency Department (HOSPITAL_COMMUNITY)
Admission: EM | Admit: 2016-06-05 | Discharge: 2016-06-05 | Disposition: A | Discharge status: Home / Self Care | Payer: Medicaid Other | Attending: Emergency Medicine | Admitting: Emergency Medicine

## 2016-06-05 ENCOUNTER — Encounter (HOSPITAL_COMMUNITY): Payer: Self-pay

## 2016-06-05 DIAGNOSIS — Z23 Encounter for immunization: Secondary | ICD-10-CM | POA: Diagnosis not present

## 2016-06-05 DIAGNOSIS — Y999 Unspecified external cause status: Secondary | ICD-10-CM | POA: Insufficient documentation

## 2016-06-05 DIAGNOSIS — F10129 Alcohol abuse with intoxication, unspecified: Secondary | ICD-10-CM | POA: Diagnosis not present

## 2016-06-05 DIAGNOSIS — S51812A Laceration without foreign body of left forearm, initial encounter: Secondary | ICD-10-CM | POA: Insufficient documentation

## 2016-06-05 DIAGNOSIS — Y9389 Activity, other specified: Secondary | ICD-10-CM | POA: Diagnosis not present

## 2016-06-05 DIAGNOSIS — W260XXA Contact with knife, initial encounter: Secondary | ICD-10-CM | POA: Diagnosis not present

## 2016-06-05 DIAGNOSIS — F909 Attention-deficit hyperactivity disorder, unspecified type: Secondary | ICD-10-CM | POA: Diagnosis not present

## 2016-06-05 DIAGNOSIS — S41112A Laceration without foreign body of left upper arm, initial encounter: Secondary | ICD-10-CM

## 2016-06-05 DIAGNOSIS — F17210 Nicotine dependence, cigarettes, uncomplicated: Secondary | ICD-10-CM | POA: Insufficient documentation

## 2016-06-05 DIAGNOSIS — Y929 Unspecified place or not applicable: Secondary | ICD-10-CM | POA: Insufficient documentation

## 2016-06-05 MED ORDER — LIDOCAINE-EPINEPHRINE (PF) 2 %-1:200000 IJ SOLN
20.0000 mL | Freq: Once | INTRAMUSCULAR | Status: AC
  Administered 2016-06-05: 20 mL

## 2016-06-05 MED ORDER — TETANUS-DIPHTH-ACELL PERTUSSIS 5-2.5-18.5 LF-MCG/0.5 IM SUSP
INTRAMUSCULAR | Status: AC
  Filled 2016-06-05: qty 0.5

## 2016-06-05 MED ORDER — LIDOCAINE-EPINEPHRINE (PF) 2 %-1:200000 IJ SOLN
INTRAMUSCULAR | Status: AC
  Filled 2016-06-05: qty 20

## 2016-06-05 MED ORDER — TETANUS-DIPHTH-ACELL PERTUSSIS 5-2.5-18.5 LF-MCG/0.5 IM SUSP
0.5000 mL | Freq: Once | INTRAMUSCULAR | Status: AC
  Administered 2016-06-05: 0.5 mL via INTRAMUSCULAR

## 2016-06-05 NOTE — Discharge Instructions (Signed)
You need to have your staples removed in 1 week.
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
## 2016-06-13 ENCOUNTER — Emergency Department (HOSPITAL_COMMUNITY)
Admission: EM | Admit: 2016-06-13 | Discharge: 2016-06-13 | Disposition: A | Discharge status: Home / Self Care | Payer: Medicaid Other | Attending: Emergency Medicine | Admitting: Emergency Medicine

## 2016-06-13 ENCOUNTER — Encounter (HOSPITAL_COMMUNITY): Payer: Self-pay | Admitting: *Deleted

## 2016-06-13 DIAGNOSIS — Z4802 Encounter for removal of sutures: Secondary | ICD-10-CM | POA: Insufficient documentation

## 2016-06-13 DIAGNOSIS — F17210 Nicotine dependence, cigarettes, uncomplicated: Secondary | ICD-10-CM | POA: Insufficient documentation

## 2016-06-13 DIAGNOSIS — F909 Attention-deficit hyperactivity disorder, unspecified type: Secondary | ICD-10-CM | POA: Diagnosis not present

## 2016-06-13 DIAGNOSIS — M549 Dorsalgia, unspecified: Secondary | ICD-10-CM | POA: Diagnosis not present

## 2016-06-13 NOTE — ED Triage Notes (Signed)
Pt is here to have staples removed from his left forearm.  Pt had these placed on 1/2.

## 2016-06-13 NOTE — Discharge Instructions (Signed)
Please return if any sign of infection.

## 2016-06-13 NOTE — ED Provider Notes (Signed)
[facility]-EM[facility]Y DEPT Provider Note   CSN: [id number] Arrival date & time: 06/13/16  1151     History   Chief Complaint Chief Complaint  Patient presents with  . Suture / Staple Removal    HPI Terrence Strickland is a 51 y.o. male.  The history is provided by the patient.  Suture / Staple Removal  The current episode started more than 1 week ago. The problem occurs daily. The problem has been gradually improving. Pertinent negatives include no chest pain, no abdominal pain, no headaches and no shortness of breath. Associated symptoms comments: No fever or drainage. Nothing aggravates the symptoms. Nothing relieves the symptoms. He has tried nothing for the symptoms.    Past Medical History:  Diagnosis Date  . ADHD (attention deficit hyperactivity disorder)   . Alcohol abuse   . Depression   . Irregular heart beat     Patient Active Problem List   Diagnosis Date Noted  . Chest pain, rule out acute myocardial infarction 12/11/2014  . Chest pain 12/11/2014  . Herniated lumbar intervertebral disc 03/18/2013  . Lumbosacral spondylosis without myelopathy 03/18/2013  . DEGENERATIVE DISC DISEASE, LUMBOSACRAL SPINE 10/18/2008  . Backache 10/18/2008    History reviewed. No pertinent surgical history.     Home Medications    Prior to Admission medications   Not on File    Family History Family History  Problem Relation Age of Onset  . Cancer    . Diabetes      Social History Social History  Substance Use Topics  . Smoking status: Current Every Day Smoker    Packs/day: 0.50    Years: 15.00    Types: Cigarettes  . Smokeless tobacco: Never Used  . Alcohol use 1.2 oz/week    2 Cans of beer per week     Comment: weekends     Allergies   Nsaids and Tramadol   Review of Systems Review of Systems  Constitutional: Negative for activity change.       All ROS Neg except as noted in HPI  HENT: Negative for nosebleeds.   Eyes: Negative for photophobia and discharge.    Respiratory: Negative for cough, shortness of breath and wheezing.   Cardiovascular: Positive for palpitations. Negative for chest pain.  Gastrointestinal: Negative for abdominal pain and blood in stool.  Genitourinary: Negative for dysuria, frequency and hematuria.  Musculoskeletal: Positive for back pain. Negative for arthralgias and neck pain.  Skin: Negative.   Neurological: Negative for dizziness, seizures, speech difficulty and headaches.  Psychiatric/Behavioral: Negative for confusion and hallucinations.     Physical Exam Updated Vital Signs BP 150/94 (BP Location: Right Arm)   Pulse 78   Temp 98.7 F (37.1 C) (Oral)   Resp 16   Wt 79.4 kg   SpO2 99%   BMI 24.41 kg/m   Physical Exam  Constitutional: He is oriented to person, place, and time. He appears well-developed and well-nourished.  Non-toxic appearance.  HENT:  Head: Normocephalic.  Right Ear: Tympanic membrane and external ear normal.  Left Ear: Tympanic membrane and external ear normal.  Eyes: EOM and lids are normal. Pupils are equal, round, and reactive to light.  Neck: Normal range of motion. Neck supple. Carotid bruit is not present.  Cardiovascular: Normal rate, regular rhythm, normal heart sounds, intact distal pulses and normal pulses.   Pulmonary/Chest: Breath sounds normal. No respiratory distress.  Abdominal: Soft. Bowel sounds are normal. There is no tenderness. There is no guarding.  Musculoskeletal: Normal range  of motion.       Left forearm: He exhibits tenderness and laceration.       Arms: Healed laceration of the left forearm. No red streaks and no drainage. Staples in place.  Lymphadenopathy:       Head (right side): No submandibular adenopathy present.       Head (left side): No submandibular adenopathy present.    He has no cervical adenopathy.  Neurological: He is alert and oriented to person, place, and time. He has normal strength. No cranial nerve deficit or sensory deficit.  Skin:  Skin is warm and dry.  Psychiatric: He has a normal mood and affect. His speech is normal.  Nursing note and vitals reviewed.    ED Treatments / Results  Labs (all labs ordered are listed, but only abnormal results are displayed) Labs Reviewed - No data to display  EKG  EKG Interpretation None       Radiology No results found.  Procedures Procedures (including critical care time)  Medications Ordered in ED Medications - No data to display   Initial Impression / Assessment and Plan / ED Course  I have reviewed the triage vital signs and the nursing notes.  Pertinent labs & imaging results that were available during my care of the patient were reviewed by me and considered in my medical decision making (see chart for details).  Clinical Course     **I have reviewed nursing notes, vital signs, and all appropriate lab and imaging results for this patient.*  Final Clinical Impressions(s) / ED Diagnoses MDM Patient sustained a laceration to the left forearm that was repaired on January 2. The patient presents now for staples to be removed. The wound appears to be healing nicely. There is no evidence of advancing infection. Staples removed by nursing staff. The patient will see his primary physician or return to the emergency department if any changes, problems, or concerns.    Final diagnoses:  Visit for suture removal    New Prescriptions New Prescriptions   No medications on file     Ivery QualeHobson Sarahmarie Leavey, PA-C 06/16/16 1444    Bethann BerkshireJoseph Zammit, MD 06/25/16 [id number]

## 2016-08-31 DIAGNOSIS — F909 Attention-deficit hyperactivity disorder, unspecified type: Secondary | ICD-10-CM | POA: Diagnosis not present

## 2016-08-31 DIAGNOSIS — L84 Corns and callosities: Secondary | ICD-10-CM | POA: Insufficient documentation

## 2016-08-31 DIAGNOSIS — F17210 Nicotine dependence, cigarettes, uncomplicated: Secondary | ICD-10-CM | POA: Insufficient documentation

## 2016-08-31 DIAGNOSIS — M79672 Pain in left foot: Secondary | ICD-10-CM | POA: Diagnosis present

## 2016-09-01 ENCOUNTER — Encounter (HOSPITAL_COMMUNITY): Payer: Self-pay

## 2016-09-01 ENCOUNTER — Emergency Department (HOSPITAL_COMMUNITY)
Admission: EM | Admit: 2016-09-01 | Discharge: 2016-09-01 | Disposition: A | Discharge status: Home / Self Care | Payer: Medicaid Other | Attending: Emergency Medicine | Admitting: Emergency Medicine

## 2016-09-01 DIAGNOSIS — L84 Corns and callosities: Secondary | ICD-10-CM

## 2016-09-01 MED ORDER — ACETAMINOPHEN 325 MG PO TABS
650.0000 mg | ORAL_TABLET | Freq: Once | ORAL | Status: AC
  Administered 2016-09-01: 650 mg via ORAL
  Filled 2016-09-01: qty 2

## 2016-09-01 NOTE — Discharge Instructions (Signed)
Soak your foot in warm epsom salts for about 10 minutes then use a pumice stone to rub off the layers of dead skin (callus). You should consider getting inserts in your boots you wear at work to help prevent further callus to form. You can take acetaminophen 650 mg every 6 hrs for pain.   Recheck if the area gets red, you see a red streak going up your foot, you get a fever or it seems worse.

## 2016-09-01 NOTE — ED Provider Notes (Signed)
[facility] Provider Note   CSN: [id number] Arrival date & time: 08/31/16  2354  Time seen 01:15 AM   History   Chief Complaint Chief Complaint  Patient presents with  . Foot Pain    HPI Terrence Strickland is a 51 y.o. male.  HPI  patient states 2-3 days ago he noticed a painful area on the sole of his left foot. He describes it as a burning discomfort. It does not itch. 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 He does states he has a pair of boots to have a hole in the sole. He states 2 or 3 weeks ago he used some fingernail clippers to cut out the area however it seems to have returned. He denies drainage, fever.   PCP Isabella Stalling, MD   Past Medical History:  Diagnosis Date  . ADHD (attention deficit hyperactivity disorder)   . Alcohol abuse   . Depression   . Irregular heart beat     Patient Active Problem List   Diagnosis Date Noted  . Chest pain, rule out acute myocardial infarction 12/11/2014  . Chest pain 12/11/2014  . Herniated lumbar intervertebral disc 03/18/2013  . Lumbosacral spondylosis without myelopathy 03/18/2013  . DEGENERATIVE DISC DISEASE, LUMBOSACRAL SPINE 10/18/2008  . Backache 10/18/2008    History reviewed. No pertinent surgical history.     Home Medications    Prior to Admission medications   Medication Sig Start Date End Date Taking? Authorizing Provider  oxyCODONE (OXY IR/ROXICODONE) 5 MG immediate release tablet Take one tablet three times a day as needed. 05/18/16   Historical Provider, MD    Family History Family History  Problem Relation Age of Onset  . Cancer    . Diabetes      Social History Social History  Substance Use Topics  . Smoking status: Current Every Day Smoker    Packs/day: 0.50    Years: 15.00    Types: Cigarettes  . Smokeless tobacco: Never Used  . Alcohol use 1.2 oz/week    2 Cans of beer per week     Comment: weekends  employed   Allergies     Nsaids and Tramadol   Review of Systems Review of Systems  All other systems reviewed and are negative.    Physical Exam Updated Vital Signs BP (!) 157/100 (BP Location: Right Arm)   Pulse 92   Temp 98.8 F (37.1 C) (Oral)   Resp 14   Ht  (1.803 m)   Wt 170 lb (77.1 kg)   SpO2 98%   BMI 23.71 kg/m   Vital signs normal except for hypertension   Physical Exam  Constitutional: He is oriented to person, place, and time. He appears well-developed and well-nourished.  HENT:  Head: Normocephalic and atraumatic.  Right Ear: External ear normal.  Left Ear: External ear normal.  Eyes: Conjunctivae and EOM are normal.  Neck: Normal range of motion.  Cardiovascular: Normal rate.   Pulmonary/Chest: Effort normal. No respiratory distress.  Musculoskeletal: Normal range of motion.  Neurological: He is alert and oriented to person, place, and time. No cranial nerve deficit.  Skin: Skin is warm and dry. No rash noted. No erythema. No pallor.  Patient is noted to have a callus on the sole of his left foot proximal to the MTP of the little toe. It is hard to touch like a callus. There is no signs of infection, no redness, warmth,  fluctuance.           ED Treatments / Results   Procedures Procedures (including critical care time)  Medications Ordered in ED Medications  acetaminophen (TYLENOL) tablet 650 mg (not administered)     Initial Impression / Assessment and Plan / ED Course  I have reviewed the triage vital signs and the nursing notes.  Pertinent labs & imaging results that were available during my care of the patient were reviewed by me and considered in my medical decision making (see chart for details).  Pt was given acetaminophen for pain. We discussed he has a callus and the treatment for that. We also discussed getting inserts for his boots.   Final Clinical Impressions(s) / ED Diagnoses   Final diagnoses:  Callus of foot    Plan discharge  Devoria Albe, MD, Concha Pyo, MD 09/01/16 [phone]

## 2016-09-01 NOTE — ED Triage Notes (Signed)
Pain to the bottom of his right foot, states is painful to walk, points to the ball of his foot.

## 2017-10-02 ENCOUNTER — Emergency Department (HOSPITAL_COMMUNITY): Payer: Medicaid Other

## 2017-10-02 ENCOUNTER — Encounter (HOSPITAL_COMMUNITY): Payer: Self-pay | Admitting: Emergency Medicine

## 2017-10-02 ENCOUNTER — Other Ambulatory Visit: Payer: Self-pay

## 2017-10-02 ENCOUNTER — Emergency Department (HOSPITAL_COMMUNITY)
Admission: EM | Admit: 2017-10-02 | Discharge: 2017-10-02 | Disposition: A | Discharge status: Home / Self Care | Payer: Medicaid Other | Attending: Emergency Medicine | Admitting: Emergency Medicine

## 2017-10-02 DIAGNOSIS — S80252A Superficial foreign body, left knee, initial encounter: Secondary | ICD-10-CM | POA: Diagnosis not present

## 2017-10-02 DIAGNOSIS — M79671 Pain in right foot: Secondary | ICD-10-CM | POA: Diagnosis not present

## 2017-10-02 DIAGNOSIS — F17210 Nicotine dependence, cigarettes, uncomplicated: Secondary | ICD-10-CM | POA: Insufficient documentation

## 2017-10-02 DIAGNOSIS — Y939 Activity, unspecified: Secondary | ICD-10-CM | POA: Diagnosis not present

## 2017-10-02 DIAGNOSIS — Y999 Unspecified external cause status: Secondary | ICD-10-CM | POA: Diagnosis not present

## 2017-10-02 DIAGNOSIS — X58XXXA Exposure to other specified factors, initial encounter: Secondary | ICD-10-CM | POA: Insufficient documentation

## 2017-10-02 DIAGNOSIS — Y929 Unspecified place or not applicable: Secondary | ICD-10-CM | POA: Diagnosis not present

## 2017-10-02 DIAGNOSIS — M79672 Pain in left foot: Secondary | ICD-10-CM | POA: Diagnosis present

## 2017-10-02 MED ORDER — NAPROXEN 500 MG PO TABS: 500.0000 mg | ORAL_TABLET | Freq: Two times a day (BID) | ORAL | 0 refills | Status: DC

## 2017-10-02 MED ORDER — NAPROXEN 250 MG PO TABS
500.0000 mg | ORAL_TABLET | Freq: Once | ORAL | Status: AC
  Administered 2017-10-02: 500 mg via ORAL
  Filled 2017-10-02: qty 2

## 2017-10-02 NOTE — Discharge Instructions (Addendum)
You were seen today for bilateral foot pain and left knee pain.  X-rays of your left knee show a foreign body.  It is unclear where this came from.  However, you have no overlying evidence of infection or laceration.  Make sure to wear good fitting shoes and this will likely help with your foot pain.

## 2017-10-02 NOTE — ED Provider Notes (Signed)
[facility] EMERGENCY DEPARTMENT Provider Note   CSN: [id number] Arrival date & time: 10/02/17  0304     History   Chief Complaint Chief Complaint  Patient presents with  . Leg Pain    HPI Terrence Strickland is a 52 y.o. male.  HPI  This is a 52 year old male with a history of alcohol abuse, ADHD who presents with bilateral foot pain and left knee pain.  Patient reports 1 week of symptoms.  He reports progressive bilateral foot pain.  He denies any injury or ill fitting shoes.  He is on his feet a lot.  He describes the pain as pressure and burning.  No history of diabetes.  Rates his pain at this time at 9 out of 10.  He has not taken anything for the pain.  Additionally he reports left knee pain.  He noted a bump that came up over his left knee 1 week ago.  Has not noted any overlying skin changes or redness.  No fevers.  He has been ambulatory.  Past Medical History:  Diagnosis Date  . ADHD (attention deficit hyperactivity disorder)   . Alcohol abuse   . Depression   . Irregular heart beat     Patient Active Problem List   Diagnosis Date Noted  . Chest pain, rule out acute myocardial infarction 12/11/2014  . Chest pain 12/11/2014  . Herniated lumbar intervertebral disc 03/18/2013  . Lumbosacral spondylosis without myelopathy 03/18/2013  . DEGENERATIVE DISC DISEASE, LUMBOSACRAL SPINE 10/18/2008  . Backache 10/18/2008    History reviewed. No pertinent surgical history.      Home Medications    Prior to Admission medications   Medication Sig Start Date End Date Taking? Authorizing Provider  naproxen (NAPROSYN) 500 MG tablet Take 1 tablet (500 mg total) by mouth 2 (two) times daily. 10/02/17   Tallula Grindle, Mayer Masker, MD  oxyCODONE (OXY IR/ROXICODONE) 5 MG immediate release tablet Take one tablet three times a day as needed. 05/18/16   [provider]    Family History Family History  Problem Relation Age of Onset  . Cancer Unknown   . Diabetes Unknown      Social History Social History   Tobacco Use  . Smoking status: Current Every Day Smoker    Packs/day: 0.50    Years: 15.00    Pack years: 7.50    Types: Cigarettes  . Smokeless tobacco: Never Used  Substance Use Topics  . Alcohol use: Yes    Alcohol/week: 1.2 oz    Types: 2 Cans of beer per week    Comment: weekends  . Drug use: No     Allergies   Nsaids and Tramadol   Review of Systems Review of Systems  Constitutional: Negative for fever.  Musculoskeletal:       Bilateral foot pain, left knee pain  Skin: Negative for color change and wound.  All other systems reviewed and are negative.    Physical Exam Updated Vital Signs BP (!) 135/93 (BP Location: Left Arm)   Pulse 82   Temp 99 F (37.2 C) (Oral)   Resp 19   Ht  (1.803 m)   Wt 77.1 kg (170 lb)   SpO2 98%   BMI 23.71 kg/m   Physical Exam  Constitutional: He is oriented to person, place, and time. No distress.  Disheveled appearing, no acute distress  HENT:  Head: Normocephalic and atraumatic.  Eyes:  injected conjunctival bilaterally  Cardiovascular: Normal rate and regular rhythm.  Pulmonary/Chest: Effort normal. No respiratory distress.  Musculoskeletal:  Bilateral feet without significant swelling, no obvious wounds or overlying skin changes, he does have fairly large bunions bilaterally, sensation intact, no obvious deformities Mobile 2 cm nodule over the anterior left knee, soft, nonfluctuant, no overlying skin changes, no overlying abrasions, there is a well healing scar.  Normal range of motion of that knee  Neurological: He is alert and oriented to person, place, and time.  Skin: Skin is warm and dry.  Psychiatric: He has a normal mood and affect.  Nursing note and vitals reviewed.    ED Treatments / Results  Labs (all labs ordered are listed, but only abnormal results are displayed) Labs Reviewed - No data to display  EKG None  Radiology Dg Knee Complete 4 Views  Left  Result Date: 10/02/2017 CLINICAL DATA:  Bilateral foot pain and left knee pain. No known injury. EXAM: LEFT KNEE - COMPLETE 4+ VIEW COMPARISON:  None. FINDINGS: In the soft tissues anterior to the inferior left patella, there is a square-shaped radiopaque object possibly representing a foreign body such as a glass fragment. There is surrounding soft tissue swelling possibly indicating surrounding infection. Left knee appears otherwise intact. No evidence of acute fracture or dislocation. No focal bone lesion or bone destruction. No significant effusion. IMPRESSION: Square-shaped radiopaque object in the soft tissues anterior to the inferior left patella possibly representing a glass fragment. Surrounding soft tissue swelling suggest infection. No acute bony abnormalities. Electronically Signed   By: Burman Nieves M.D.   On: 10/02/2017 04:12    Procedures Procedures (including critical care time)  Medications Ordered in ED Medications  naproxen (NAPROSYN) tablet 500 mg (500 mg Oral Given 10/02/17 0405)     Initial Impression / Assessment and Plan / ED Course  I have reviewed the triage vital signs and the nursing notes.  Pertinent labs & imaging results that were available during my care of the patient were reviewed by me and considered in my medical decision making (see chart for details).     Patient presents with bilateral foot pain and knee pain.  He is nontoxic on exam vital signs are reassuring.  Regarding his foot pain, his exam is fairly reassuring with the exception of bunions bilaterally.  Without injury, would have low suspicion for fracture.  Will defer imaging.  He does have swelling over his left anterior knee.  It does not appear infected.  Given that it is new, will obtain an x-ray.  X-ray is notable for what appears to be a small foreign body.  On repeat exam, there is no palpable foreign body and no laceration or acute wound to explain where the foreign body would come  from.  I have low suspicion for acute infection.  Patient was informed that he has a foreign body there.  This may work itself out.  Patient was given naproxen for pain.  After history, exam, and medical workup I feel the patient has been appropriately medically screened and is safe for discharge home. Pertinent diagnoses were discussed with the patient. Patient was given return precautions.   Final Clinical Impressions(s) / ED Diagnoses   Final diagnoses:  Bilateral foot pain  Superficial foreign body of left knee, initial encounter    ED Discharge Orders        Ordered    naproxen (NAPROSYN) 500 MG tablet  2 times daily     10/02/17 0437       Jaline Pincock, Mayer Masker, MD 10/02/17  0442  

## 2017-10-02 NOTE — ED Triage Notes (Signed)
Pt states he has bilateral feet pain and left knee. No known injury.

## 2017-10-02 NOTE — ED Notes (Addendum)
Pt refused vital signs, signing for d/c papers.

## 2017-10-02 NOTE — ED Notes (Signed)
This nurse went to d/c pt. This nurse went over prescription with pt and pt states "nah nah I dont feel comfortable with this doctors order, when does the next doctor come in?" and pt states "where do I need to wait for another doctor." this nurse offered pt d/c papers and he replies "nah im good." MD notified. Pt ambulatory to waiting room.

## 2018-02-20 ENCOUNTER — Other Ambulatory Visit: Payer: Self-pay

## 2018-02-20 ENCOUNTER — Encounter (HOSPITAL_COMMUNITY): Payer: Self-pay | Admitting: Student

## 2018-02-20 ENCOUNTER — Emergency Department (HOSPITAL_COMMUNITY): Payer: Medicaid Other

## 2018-02-20 ENCOUNTER — Emergency Department (HOSPITAL_COMMUNITY)
Admission: EM | Admit: 2018-02-20 | Discharge: 2018-02-20 | Disposition: A | Discharge status: Home / Self Care | Payer: Medicaid Other | Attending: Emergency Medicine | Admitting: Emergency Medicine

## 2018-02-20 DIAGNOSIS — E042 Nontoxic multinodular goiter: Secondary | ICD-10-CM | POA: Diagnosis not present

## 2018-02-20 DIAGNOSIS — M542 Cervicalgia: Secondary | ICD-10-CM | POA: Diagnosis present

## 2018-02-20 DIAGNOSIS — Y92410 Unspecified street and highway as the place of occurrence of the external cause: Secondary | ICD-10-CM | POA: Insufficient documentation

## 2018-02-20 DIAGNOSIS — Y999 Unspecified external cause status: Secondary | ICD-10-CM | POA: Diagnosis not present

## 2018-02-20 DIAGNOSIS — M549 Dorsalgia, unspecified: Secondary | ICD-10-CM | POA: Diagnosis not present

## 2018-02-20 DIAGNOSIS — F17210 Nicotine dependence, cigarettes, uncomplicated: Secondary | ICD-10-CM | POA: Diagnosis not present

## 2018-02-20 DIAGNOSIS — Y9389 Activity, other specified: Secondary | ICD-10-CM | POA: Diagnosis not present

## 2018-02-20 DIAGNOSIS — Z79899 Other long term (current) drug therapy: Secondary | ICD-10-CM | POA: Diagnosis not present

## 2018-02-20 MED ORDER — METHOCARBAMOL 500 MG PO TABS: 500.0000 mg | ORAL_TABLET | Freq: Three times a day (TID) | ORAL | 0 refills | Status: DC | PRN
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
## 2018-02-26 ENCOUNTER — Other Ambulatory Visit: Payer: Self-pay

## 2018-02-26 ENCOUNTER — Emergency Department (HOSPITAL_COMMUNITY)
Admission: EM | Admit: 2018-02-26 | Discharge: 2018-02-26 | Disposition: A | Discharge status: Home / Self Care | Payer: Medicaid Other | Attending: Emergency Medicine | Admitting: Emergency Medicine

## 2018-02-26 ENCOUNTER — Encounter (HOSPITAL_COMMUNITY): Payer: Self-pay | Admitting: Emergency Medicine

## 2018-02-26 DIAGNOSIS — I10 Essential (primary) hypertension: Secondary | ICD-10-CM | POA: Diagnosis not present

## 2018-02-26 DIAGNOSIS — M7918 Myalgia, other site: Secondary | ICD-10-CM | POA: Diagnosis not present

## 2018-02-26 DIAGNOSIS — F1010 Alcohol abuse, uncomplicated: Secondary | ICD-10-CM | POA: Insufficient documentation

## 2018-02-26 DIAGNOSIS — M542 Cervicalgia: Secondary | ICD-10-CM | POA: Diagnosis present

## 2018-02-26 DIAGNOSIS — F909 Attention-deficit hyperactivity disorder, unspecified type: Secondary | ICD-10-CM | POA: Insufficient documentation

## 2018-02-26 DIAGNOSIS — F17210 Nicotine dependence, cigarettes, uncomplicated: Secondary | ICD-10-CM | POA: Insufficient documentation

## 2018-02-26 DIAGNOSIS — F329 Major depressive disorder, single episode, unspecified: Secondary | ICD-10-CM | POA: Diagnosis not present

## 2018-02-26 DIAGNOSIS — E041 Nontoxic single thyroid nodule: Secondary | ICD-10-CM | POA: Diagnosis not present

## 2018-02-26 MED ORDER — METHOCARBAMOL 500 MG PO TABS: 500.0000 mg | ORAL_TABLET | Freq: Two times a day (BID) | ORAL | 0 refills | Status: DC

## 2018-02-26 MED ORDER — TRAMADOL HCL 50 MG PO TABS: 50.0000 mg | ORAL_TABLET | Freq: Two times a day (BID) | ORAL | 0 refills | Status: DC | PRN

## 2018-02-26 MED ORDER — MORPHINE SULFATE (PF) 4 MG/ML IV SOLN
4.0000 mg | Freq: Once | INTRAVENOUS | Status: AC
  Administered 2018-02-26: 4 mg via INTRAMUSCULAR
  Filled 2018-02-26: qty 1

## 2018-02-26 MED ORDER — KETOROLAC TROMETHAMINE 60 MG/2ML IM SOLN
15.0000 mg | Freq: Once | INTRAMUSCULAR | Status: AC
  Administered 2018-02-26: 15 mg via INTRAMUSCULAR
  Filled 2018-02-26: qty 2

## 2018-02-26 NOTE — Discharge Instructions (Addendum)
You may alternate taking Tylenol and Ibuprofen as needed for pain control. You may take 400-600 mg of ibuprofen every 6 hours and [id number] mg of Tylenol every 6 hours. Do not exceed 4000 mg of Tylenol daily as this can lead to liver damage. Also, make sure to take Ibuprofen with meals as it can cause an upset stomach. Do not take other NSAIDs while taking Ibuprofen such as (Aleve, Naprosyn, Aspirin, Celebrex, etc) and do not take more than the prescribed dose as this can lead to ulcers and bleeding in your GI tract. You may use warm and cold compresses to help with your symptoms.   Prescription given for tramadol and robaxin. Take medication as directed and do not operate machinery, drive a car, or work while taking this medication as it can make you drowsy.    You were noted to have high blood pressure today during your visit in the emergency department. You will need to follow up with your primary healthcare provider to have your blood pressure rechecked as you may need to be started on medication for this if it remains elevated. If you experience any chest pain, shortness of breath, severe headaches, vision changes, numbness, weakness, lightheadedness, changes in mental status, or decrease in urination you should return to the emergency department immediately.
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
## 2018-05-02 ENCOUNTER — Telehealth: Payer: Self-pay

## 2018-05-02 NOTE — Congregational Nurse Program (Signed)
Seen at the [employer] [facility]. No concerns [address] Evynn Boutelle [place] [facility] [phone]

## 2018-08-11 NOTE — Congregational Nurse Program (Signed)
  Dept: [phone]   Congregational Nurse Program Note  Date of Encounter: 08/07/2018  Past Medical History: Past Medical History:  Diagnosis Date  . ADHD (attention deficit hyperactivity disorder)   . Alcohol abuse   . Depression   . Irregular heart beat     Encounter Details: CNP Questionnaire - 08/07/18 2337      Questionnaire   Patient Status  Not Applicable    Race  Black or African American    Location Patient Served At  [employer], [employer]    Uninsured  Not Applicable    Food  No food insecurities    Housing/Utilities  Yes, have permanent housing    Transportation  No transportation needs    Interpersonal Safety  Yes, feel physically and emotionally safe where you currently live    Medication  No medication insecurities    Medical Provider  Yes    Referrals  Not Applicable    ED Visit Averted  Not Applicable    Life-Saving Intervention Made  Not Applicable

## 2018-08-29 NOTE — Telephone Encounter (Signed)
Error - duplicate

## 2019-04-06 ENCOUNTER — Emergency Department (HOSPITAL_COMMUNITY): Payer: Medicaid Other

## 2019-04-06 ENCOUNTER — Other Ambulatory Visit: Payer: Self-pay

## 2019-04-06 ENCOUNTER — Encounter (HOSPITAL_COMMUNITY): Payer: Self-pay | Admitting: Emergency Medicine

## 2019-04-06 ENCOUNTER — Emergency Department (HOSPITAL_COMMUNITY)
Admission: EM | Admit: 2019-04-06 | Discharge: 2019-04-06 | Disposition: A | Discharge status: Home / Self Care | Payer: Medicaid Other | Attending: Emergency Medicine | Admitting: Emergency Medicine

## 2019-04-06 DIAGNOSIS — R55 Syncope and collapse: Secondary | ICD-10-CM | POA: Insufficient documentation

## 2019-04-06 DIAGNOSIS — F17210 Nicotine dependence, cigarettes, uncomplicated: Secondary | ICD-10-CM | POA: Diagnosis not present

## 2019-04-06 DIAGNOSIS — Z79899 Other long term (current) drug therapy: Secondary | ICD-10-CM | POA: Diagnosis not present

## 2019-04-06 LAB — BASIC METABOLIC PANEL
Anion gap: 15 (ref 5–15)
BUN: 10 mg/dL (ref 6–20)
CO2: 23 mmol/L (ref 22–32)
Calcium: 9.1 mg/dL (ref 8.9–10.3)
Chloride: 99 mmol/L (ref 98–111)
Creatinine, Ser: 1.24 mg/dL (ref 0.61–1.24)
GFR calc Af Amer: >60 mL/min (ref >60)
GFR calc non Af Amer: >60 mL/min (ref >60)
Glucose, Bld: 101 mg/dL — ABNORMAL HIGH (ref 70–99)
Potassium: 3 mmol/L — ABNORMAL LOW (ref 3.5–5.1)
Sodium: 137 mmol/L (ref 135–145)

## 2019-04-06 LAB — CBC
HCT: 44.5 % (ref 39.0–52.0)
Hemoglobin: 14.6 g/dL (ref 13.0–17.0)
MCH: 31.7 pg (ref 26.0–34.0)
MCHC: 32.8 g/dL (ref 30.0–36.0)
MCV: 96.5 fL (ref 80.0–100.0)
Platelets: 266 10*3/uL (ref 150–400)
RBC: 4.61 MIL/uL (ref 4.22–5.81)
RDW: 13.9 % (ref 11.5–15.5)
WBC: 7.5 10*3/uL (ref 4.0–10.5)
nRBC: 0 % (ref 0.0–0.2)

## 2019-04-06 LAB — ETHANOL: Alcohol, Ethyl (B): 50 mg/dL — ABNORMAL HIGH (ref <10)

## 2019-04-06 LAB — CBG MONITORING, ED: Glucose-Capillary: 116 mg/dL — ABNORMAL HIGH (ref 70–99)
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
## 2019-04-06 NOTE — Discharge Instructions (Addendum)
Drink plenty of fluids and get plenty of rest.  Return to the emergency department if you develop any new and/or concerning symptoms.  Be sure to follow-up with your primary doctor in the next 1 to 2 weeks.

## 2020-10-13 ENCOUNTER — Encounter (HOSPITAL_COMMUNITY): Payer: Self-pay

## 2020-10-13 ENCOUNTER — Emergency Department (HOSPITAL_COMMUNITY): Payer: Medicaid Other

## 2020-10-13 ENCOUNTER — Emergency Department (HOSPITAL_COMMUNITY)
Admission: EM | Admit: 2020-10-13 | Discharge: 2020-10-13 | Disposition: A | Discharge status: Home / Self Care | Payer: Medicaid Other | Attending: Emergency Medicine | Admitting: Emergency Medicine

## 2020-10-13 ENCOUNTER — Other Ambulatory Visit: Payer: Self-pay

## 2020-10-13 DIAGNOSIS — R0789 Other chest pain: Secondary | ICD-10-CM | POA: Diagnosis present

## 2020-10-13 DIAGNOSIS — Z79899 Other long term (current) drug therapy: Secondary | ICD-10-CM | POA: Diagnosis not present

## 2020-10-13 DIAGNOSIS — F17210 Nicotine dependence, cigarettes, uncomplicated: Secondary | ICD-10-CM | POA: Diagnosis not present

## 2020-10-13 LAB — BASIC METABOLIC PANEL
Anion gap: 8 (ref 5–15)
BUN: 12 mg/dL (ref 6–20)
CO2: 28 mmol/L (ref 22–32)
Calcium: 9.2 mg/dL (ref 8.9–10.3)
Chloride: 105 mmol/L (ref 98–111)
Creatinine, Ser: 1 mg/dL (ref 0.61–1.24)
GFR, Estimated: >60 mL/min (ref >60)
Glucose, Bld: 90 mg/dL (ref 70–99)
Potassium: 4.2 mmol/L (ref 3.5–5.1)
Sodium: 141 mmol/L (ref 135–145)

## 2020-10-13 LAB — CBC
HCT: 45.3 % (ref 39.0–52.0)
Hemoglobin: 14.6 g/dL (ref 13.0–17.0)
MCH: 30.7 pg (ref 26.0–34.0)
MCHC: 32.2 g/dL (ref 30.0–36.0)
MCV: 95.4 fL (ref 80.0–100.0)
Platelets: 277 10*3/uL (ref 150–400)
RBC: 4.75 MIL/uL (ref 4.22–5.81)
RDW: 14.3 % (ref 11.5–15.5)
WBC: 4.7 10*3/uL (ref 4.0–10.5)
nRBC: 0 % (ref 0.0–0.2)

## 2020-10-13 LAB — HEPATIC FUNCTION PANEL
ALT: 16 U/L (ref 0–44)
AST: 27 U/L (ref 15–41)
Albumin: 3.9 g/dL (ref 3.5–5.0)
Alkaline Phosphatase: 62 U/L (ref 38–126)
Bilirubin, Direct: 0.1 mg/dL (ref 0.0–0.2)
Indirect Bilirubin: 0.5 mg/dL (ref 0.3–0.9)
Total Bilirubin: 0.6 mg/dL (ref 0.3–1.2)
Total Protein: 8.1 g/dL (ref 6.5–8.1)

## 2020-10-13 LAB — TROPONIN I (HIGH SENSITIVITY)
Troponin I (High Sensitivity): 6 ng/L (ref <18)
Troponin I (High Sensitivity): 5 ng/L (ref <18)

## 2020-10-13 LAB — PROTIME-INR
INR: 1.1 (ref 0.8–1.2)
Prothrombin Time: 14 seconds (ref 11.4–15.2)

## 2020-10-13 LAB — LIPASE, BLOOD: Lipase: 24 U/L (ref 11–51)

## 2020-10-13 MED ORDER — PANTOPRAZOLE SODIUM 20 MG PO TBEC: 20.0000 mg | DELAYED_RELEASE_TABLET | Freq: Every day | ORAL | 0 refills | Status: DC

## 2020-10-13 MED ORDER — LIDOCAINE VISCOUS HCL 2 % MT SOLN
15.0000 mL | Freq: Once | OROMUCOSAL | Status: AC
  Administered 2020-10-13: 15 mL via ORAL
  Filled 2020-10-13: qty 15

## 2020-10-13 MED ORDER — ALUM & MAG HYDROXIDE-SIMETH 200-200-20 MG/5ML PO SUSP
30.0000 mL | Freq: Once | ORAL | Status: AC
  Administered 2020-10-13: 30 mL via ORAL
  Filled 2020-10-13: qty 30

## 2020-10-13 MED ORDER — LISINOPRIL 20 MG PO TABS: 20.0000 mg | ORAL_TABLET | Freq: Every day | ORAL | 0 refills | Status: DC

## 2020-10-13 NOTE — Discharge Instructions (Addendum)
Follow-up with Dr. Diona Browner or one of his partners in the next couple weeks for your chest discomfort and your elevated blood pressure.  We have started you on some medicine for blood pressure and gave you some medicine to help with indigestion symptoms.  Return if any problems

## 2020-10-13 NOTE — ED Provider Notes (Signed)
[facility] EMERGENCY DEPARTMENT Provider Note   CSN: [id number] Arrival date & time: 10/13/20  2774     History Chief Complaint  Patient presents with  . Chest Pain    Terrence Strickland is a 55 y.o. male.  Patient complains of some chest discomfort off and on.  It is not exertional or related to breathing.  He also complains of some chest burning  The history is provided by the patient and medical records. No language interpreter was used.  Chest Pain Pain location:  Substernal area Pain quality: aching   Pain radiates to:  Does not radiate Pain severity:  Mild Onset quality:  Sudden Timing:  Constant Progression:  Worsening Context: not breathing   Relieved by:  Nothing Associated symptoms: no abdominal pain, no back pain, no cough, no fatigue and no headache        Past Medical History:  Diagnosis Date  . ADHD (attention deficit hyperactivity disorder)   . Alcohol abuse   . Depression   . Irregular heart beat     Patient Active Problem List   Diagnosis Date Noted  . Multiple thyroid nodules 02/20/2018  . Chest pain, rule out acute myocardial infarction 12/11/2014  . Chest pain 12/11/2014  . Herniated lumbar intervertebral disc 03/18/2013  . Lumbosacral spondylosis without myelopathy 03/18/2013  . DEGENERATIVE DISC DISEASE, LUMBOSACRAL SPINE 10/18/2008  . Backache 10/18/2008    History reviewed. No pertinent surgical history.     Family History  Problem Relation Age of Onset  . Cancer Other   . Diabetes Other     Social History   Tobacco Use  . Smoking status: Current Every Day Smoker    Packs/day: 0.50    Years: 15.00    Pack years: 7.50    Types: Cigarettes  . Smokeless tobacco: Never Used  Substance Use Topics  . Alcohol use: Yes    Comment: 6pack beer a day  . Drug use: No    Home Medications Prior to Admission medications   Medication Sig Start Date End Date Taking? Authorizing Provider  pantoprazole (PROTONIX) 20 MG tablet Take 1  tablet (20 mg total) by mouth daily. 10/13/20  Yes Bethann Berkshire, MD  lisinopril (ZESTRIL) 20 MG tablet Take 1 tablet (20 mg total) by mouth daily. 10/13/20  Yes Bethann Berkshire, MD    Allergies    Nsaids and Tramadol  Review of Systems   Review of Systems  Constitutional: Negative for appetite change and fatigue.  HENT: Negative for congestion, ear discharge and sinus pressure.   Eyes: Negative for discharge.  Respiratory: Negative for cough.   Cardiovascular: Positive for chest pain.  Gastrointestinal: Negative for abdominal pain and diarrhea.  Genitourinary: Negative for frequency and hematuria.  Musculoskeletal: Negative for back pain.  Skin: Negative for rash.  Neurological: Negative for seizures and headaches.  Psychiatric/Behavioral: Negative for hallucinations.    Physical Exam Updated Vital Signs BP (!) 149/108   Pulse (!) 53   Temp 98.4 F (36.9 C) (Oral)   Resp 14   Ht 5\' 9"  (1.753 m)   Wt 74.8 kg   SpO2 99%   BMI 24.37 kg/m   Physical Exam Vitals and nursing note reviewed.  Constitutional:      Appearance: He is well-developed.  HENT:     Head: Normocephalic.     Nose: Nose normal.  Eyes:     General: No scleral icterus.    Conjunctiva/sclera: Conjunctivae normal.  Neck:     Thyroid: No  thyromegaly.  Cardiovascular:     Rate and Rhythm: Normal rate and regular rhythm.     Heart sounds: No murmur heard. No friction rub. No gallop.   Pulmonary:     Breath sounds: No stridor. No wheezing or rales.  Chest:     Chest wall: No tenderness.  Abdominal:     General: There is no distension.     Tenderness: There is no abdominal tenderness. There is no rebound.  Musculoskeletal:        General: Normal range of motion.     Cervical back: Neck supple.  Lymphadenopathy:     Cervical: No cervical adenopathy.  Skin:    Findings: No erythema or rash.  Neurological:     Mental Status: He is alert and oriented to person, place, and time.     Motor: No  abnormal muscle tone.     Coordination: Coordination normal.  Psychiatric:        Behavior: Behavior normal.     ED Results / Procedures / Treatments   Labs (all labs ordered are listed, but only abnormal results are displayed) Labs Reviewed  BASIC METABOLIC PANEL  CBC  PROTIME-INR  HEPATIC FUNCTION PANEL  LIPASE, BLOOD  TROPONIN I (HIGH SENSITIVITY)  TROPONIN I (HIGH SENSITIVITY)    EKG None  Radiology DG Chest 2 View  Result Date: 10/13/2020 CLINICAL DATA:  Mid chest pain for a week. EXAM: CHEST - 2 VIEW COMPARISON:  Oct 16, 2015 FINDINGS: The heart size and mediastinal contours are within normal limits. Both lungs are clear. The visualized skeletal structures are unremarkable. IMPRESSION: No active cardiopulmonary disease. Electronically Signed   By: Gerome Sam III M.D   On: 10/13/2020 11:01    Procedures Procedures   Medications Ordered in ED Medications  alum & mag hydroxide-simeth (MAALOX/MYLANTA) 200-200-20 MG/5ML suspension 30 mL (30 mLs Oral Given 10/13/20 1105)    And  lidocaine (XYLOCAINE) 2 % viscous mouth solution 15 mL (15 mLs Oral Given 10/13/20 1105)    ED Course  I have reviewed the triage vital signs and the nursing notes.  Pertinent labs & imaging results that were available during my care of the patient were reviewed by me and considered in my medical decision making (see chart for details).    MDM Rules/Calculators/A&P                          Patient with chest discomfort that I believe is more related to GERD.  He is placed on some Protonix.  He also has uncontrolled blood pressure and will be started on some lisinopril Final Clinical Impression(s) / ED Diagnoses Final diagnoses:  Atypical chest pain    Rx / DC Orders ED Discharge Orders         Ordered    pantoprazole (PROTONIX) 20 MG tablet  Daily        10/13/20 1252    lisinopril (ZESTRIL) 20 MG tablet  Daily        10/13/20 1254           Bethann Berkshire, MD 10/14/20  1700

## 2020-10-13 NOTE — ED Triage Notes (Signed)
Pt presents to ED with complaints of mid chest pain x 1 week. Pt states pain comes and goes, feels like burning.

## 2020-10-13 NOTE — ED Notes (Signed)
Pt in bed with eyes closed, resps even and unlabored, pt arouses easily, pt reports decreased pain, pt states that he is ready to go home

## 2020-10-26 ENCOUNTER — Encounter (HOSPITAL_COMMUNITY): Payer: Self-pay

## 2020-10-26 ENCOUNTER — Other Ambulatory Visit: Payer: Self-pay

## 2020-10-26 ENCOUNTER — Emergency Department (HOSPITAL_COMMUNITY): Payer: Medicaid Other

## 2020-10-26 ENCOUNTER — Emergency Department (HOSPITAL_COMMUNITY)
Admission: EM | Admit: 2020-10-26 | Discharge: 2020-10-26 | Disposition: A | Discharge status: Home / Self Care | Payer: Medicaid Other | Attending: Emergency Medicine | Admitting: Emergency Medicine

## 2020-10-26 DIAGNOSIS — R079 Chest pain, unspecified: Secondary | ICD-10-CM | POA: Insufficient documentation

## 2020-10-26 DIAGNOSIS — F17210 Nicotine dependence, cigarettes, uncomplicated: Secondary | ICD-10-CM | POA: Diagnosis not present

## 2020-10-26 DIAGNOSIS — S0990XA Unspecified injury of head, initial encounter: Secondary | ICD-10-CM | POA: Insufficient documentation

## 2020-10-26 DIAGNOSIS — Y92009 Unspecified place in unspecified non-institutional (private) residence as the place of occurrence of the external cause: Secondary | ICD-10-CM | POA: Diagnosis not present

## 2020-10-26 LAB — CBC WITH DIFFERENTIAL/PLATELET
Abs Immature Granulocytes: 0.02 10*3/uL (ref 0.00–0.07)
Basophils Absolute: 0 10*3/uL (ref 0.0–0.1)
Basophils Relative: 0 %
Eosinophils Absolute: 0 10*3/uL (ref 0.0–0.5)
Eosinophils Relative: 0 %
HCT: 43.4 % (ref 39.0–52.0)
Hemoglobin: 14.3 g/dL (ref 13.0–17.0)
Immature Granulocytes: 0 %
Lymphocytes Relative: 16 %
Lymphs Abs: 1 10*3/uL (ref 0.7–4.0)
MCH: 31 pg (ref 26.0–34.0)
MCHC: 32.9 g/dL (ref 30.0–36.0)
MCV: 94.1 fL (ref 80.0–100.0)
Monocytes Absolute: 0.6 10*3/uL (ref 0.1–1.0)
Monocytes Relative: 10 %
Neutro Abs: 4.6 10*3/uL (ref 1.7–7.7)
Neutrophils Relative %: 74 %
Platelets: 261 10*3/uL (ref 150–400)
RBC: 4.61 MIL/uL (ref 4.22–5.81)
RDW: 14.1 % (ref 11.5–15.5)
WBC: 6.3 10*3/uL (ref 4.0–10.5)
nRBC: 0 % (ref 0.0–0.2)

## 2020-10-26 LAB — BASIC METABOLIC PANEL
Anion gap: 10 (ref 5–15)
BUN: 12 mg/dL (ref 6–20)
CO2: 24 mmol/L (ref 22–32)
Calcium: 9.1 mg/dL (ref 8.9–10.3)
Chloride: 105 mmol/L (ref 98–111)
Creatinine, Ser: 1.03 mg/dL (ref 0.61–1.24)
GFR, Estimated: >60 mL/min (ref >60)
Glucose, Bld: 108 mg/dL — ABNORMAL HIGH (ref 70–99)
Potassium: 3.3 mmol/L — ABNORMAL LOW (ref 3.5–5.1)
Sodium: 139 mmol/L (ref 135–145)

## 2020-10-26 LAB — TROPONIN I (HIGH SENSITIVITY)
Troponin I (High Sensitivity): 21 ng/L — ABNORMAL HIGH (ref <18)
Troponin I (High Sensitivity): 28 ng/L — ABNORMAL HIGH (ref <18)

## 2020-10-26 LAB — ETHANOL: Alcohol, Ethyl (B): 126 mg/dL — ABNORMAL HIGH (ref <10)

## 2020-10-26 MED ORDER — ACETAMINOPHEN 325 MG PO TABS
650.0000 mg | ORAL_TABLET | Freq: Once | ORAL | Status: AC
  Administered 2020-10-26: 650 mg via ORAL
  Filled 2020-10-26: qty 2
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
## 2020-10-26 NOTE — ED Notes (Signed)
Pt gone to CT 

## 2020-10-26 NOTE — ED Notes (Signed)
Pt noted to come out of bathroom and begin walking towards the exit. Nurse asked what are you doing sir? " . I am refusing services" . Pt exited ED ambulating , no IV.
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
## 2020-10-30 ENCOUNTER — Emergency Department (HOSPITAL_COMMUNITY)
Admission: EM | Admit: 2020-10-30 | Discharge: 2020-10-31 | Discharge status: Against Medical Advice | Payer: Medicaid Other | Attending: Emergency Medicine | Admitting: Emergency Medicine

## 2020-10-30 ENCOUNTER — Emergency Department (HOSPITAL_COMMUNITY): Payer: Medicaid Other

## 2020-10-30 ENCOUNTER — Encounter (HOSPITAL_COMMUNITY): Payer: Self-pay | Admitting: *Deleted

## 2020-10-30 DIAGNOSIS — F10129 Alcohol abuse with intoxication, unspecified: Secondary | ICD-10-CM | POA: Insufficient documentation

## 2020-10-30 DIAGNOSIS — Z5321 Procedure and treatment not carried out due to patient leaving prior to being seen by health care provider: Secondary | ICD-10-CM | POA: Diagnosis not present

## 2020-10-30 DIAGNOSIS — R0789 Other chest pain: Secondary | ICD-10-CM | POA: Diagnosis not present

## 2020-10-30 DIAGNOSIS — R079 Chest pain, unspecified: Secondary | ICD-10-CM

## 2020-10-30 DIAGNOSIS — F17210 Nicotine dependence, cigarettes, uncomplicated: Secondary | ICD-10-CM | POA: Insufficient documentation

## 2020-10-30 MED ORDER — ZIPRASIDONE MESYLATE 20 MG IM SOLR: 20.0000 mg | Freq: Once | INTRAMUSCULAR | Status: DC

## 2020-10-30 NOTE — ED Provider Notes (Signed)
[facility] EMERGENCY DEPARTMENT Provider Note   CSN: [id number] Arrival date & time: 10/30/20  2304     History Chief Complaint  Patient presents with  . Chest Pain    Terrence Strickland is a 55 y.o. male.  Level 5 caveat for intoxication.  Patient is loud and belligerent.  He is brought in by EMS with report of chest pain that onset this evening.  States he was having on and off for the past 5 years.  His history is unreliable.  He reports the pain is in the center of his chest and comes and goes lasting for several hours at a time.  Denies any radiation of the pain.  Denies any shortness of breath, cough or fever.  Denies any nausea, vomiting, diaphoresis.  Admits to alcohol use.  Denies any cocaine use. Unknown if he is ever had a heart attack in the past. Denies any abdominal pain or back pain. Admits to alcohol use tonight but denies other drugs. Denies suicidal thoughts or homicidal thoughts.  Denies hearing any voices  The history is provided by the patient and the EMS personnel. The history is limited by the condition of the patient.  Chest Pain Associated symptoms: no abdominal pain, no cough, no dizziness, no fatigue, no fever, no headache, no nausea, no shortness of breath, no vomiting and no weakness        Past Medical History:  Diagnosis Date  . ADHD (attention deficit hyperactivity disorder)   . Alcohol abuse   . Depression   . Irregular heart beat     Patient Active Problem List   Diagnosis Date Noted  . Multiple thyroid nodules 02/20/2018  . Chest pain, rule out acute myocardial infarction 12/11/2014  . Chest pain 12/11/2014  . Herniated lumbar intervertebral disc 03/18/2013  . Lumbosacral spondylosis without myelopathy 03/18/2013  . DEGENERATIVE DISC DISEASE, LUMBOSACRAL SPINE 10/18/2008  . Backache 10/18/2008    History reviewed. No pertinent surgical history.     Family History  Problem Relation Age of Onset  . Cancer Other   . Diabetes Other      Social History   Tobacco Use  . Smoking status: Current Every Day Smoker    Packs/day: 0.50    Years: 15.00    Pack years: 7.50    Types: Cigarettes  . Smokeless tobacco: Never Used  Substance Use Topics  . Alcohol use: Yes    Comment: 6pack beer a day  . Drug use: No    Home Medications Prior to Admission medications   Medication Sig Start Date End Date Taking? Authorizing Provider  lisinopril (ZESTRIL) 20 MG tablet Take 1 tablet (20 mg total) by mouth daily. 10/13/20   Bethann Berkshire, MD  pantoprazole (PROTONIX) 20 MG tablet Take 1 tablet (20 mg total) by mouth daily. 10/13/20   Bethann Berkshire, MD    Allergies    Nsaids and Tramadol  Review of Systems   Review of Systems  Constitutional: Negative for activity change, appetite change, fatigue and fever.  HENT: Negative for congestion and rhinorrhea.   Respiratory: Positive for chest tightness. Negative for cough and shortness of breath.   Cardiovascular: Positive for chest pain.  Gastrointestinal: Negative for abdominal pain, nausea and vomiting.  Genitourinary: Negative for dysuria and hematuria.  Musculoskeletal: Negative for arthralgias and myalgias.  Skin: Negative for rash.  Neurological: Negative for dizziness, weakness and headaches.   all other systems are negative except as noted in the HPI and PMH.  Physical Exam Updated Vital Signs BP (!) 163/111   Pulse 93   Temp 98.7 F (37.1 C)   Resp 19   Ht 5\' 9"  (1.753 m)   Wt 74.9 kg   SpO2 96%   BMI 24.38 kg/m   Physical Exam Vitals and nursing note reviewed.  Constitutional:      General: He is not in acute distress.    Appearance: Normal appearance. He is well-developed and normal weight. He is not ill-appearing.     Comments: Loud and belligerent.  Uncooperative Agitated and yelling  HENT:     Head: Normocephalic and atraumatic.     Mouth/Throat:     Pharynx: No oropharyngeal exudate.  Neck:     Comments: No meningismus. Cardiovascular:      Rate and Rhythm: Normal rate and regular rhythm.     Heart sounds: Normal heart sounds. No murmur heard.   Pulmonary:     Effort: Pulmonary effort is normal. No respiratory distress.     Breath sounds: Normal breath sounds.  Chest:     Chest wall: No tenderness.  Abdominal:     Comments: refuses  Skin:    Capillary Refill: Capillary refill takes less than 2 seconds.  Neurological:     General: No focal deficit present.     Mental Status: He is alert and oriented to person, place, and time.     Motor: No abnormal muscle tone.     Comments: Would not cooperate for formal neurological exam.  He is ambulatory in the room, moves all extremities, no facial droop.  Psychiatric:        Behavior: Behavior normal.     ED Results / Procedures / Treatments   Labs (all labs ordered are listed, but only abnormal results are displayed) Labs Reviewed - No data to display  EKG EKG Interpretation  Date/Time:  Sunday Oct 30 2020 23:09:05 EDT Ventricular Rate:  97 PR Interval:  200 QRS Duration: 92 QT Interval:  352 QTC Calculation: 448 R Axis:   68 Text Interpretation: Sinus rhythm Borderline prolonged PR interval Left atrial enlargement Left ventricular hypertrophy ST elev, probable normal early repol pattern Since last tracing rate faster Confirmed by [id number]) on 10/30/2020 11:12:26 PM   Radiology No results found.  Procedures Procedures   Medications Ordered in ED Medications  ziprasidone (GEODON) injection 20 mg (has no administration in time range)    ED Course  I have reviewed the triage vital signs and the nursing notes.  Pertinent labs & imaging results that were available during my care of the patient were reviewed by me and considered in my medical decision making (see chart for details).    MDM Rules/Calculators/A&P                         Intoxicated here with chest pain. History unreliable.  EKG with LVH and early repolarization  Patient  intoxicated. Refusing labs and evaluation.  Patient is not suicidal or homicidal.  He has no hallucinations.  He is oriented to person and place and time. Patient is refusing labs, chest x-ray, and formal exam.  Though intoxicated he appears to have capacity to refuse treatment.  Police and security at bedside.  Patient demanding to leave and was escorted out by police and security.  Final Clinical Impression(s) / ED Diagnoses Final diagnoses:  None    Rx / DC Orders ED Discharge Orders    None  Glynn Octave, MD 10/31/20 [phone]

## 2020-10-31 NOTE — ED Notes (Signed)
Pt cussing, yelling at nurse, wanted to leave AMA. Security present while nurse removed IV. Escorted out by security. Ambulatory.

## 2021-01-09 ENCOUNTER — Ambulatory Visit (HOSPITAL_COMMUNITY)
Admission: RE | Admit: 2021-01-09 | Discharge: 2021-01-09 | Disposition: A | Discharge status: Home / Self Care | Payer: Medicaid Other | Source: Ambulatory Visit | Attending: Internal Medicine | Admitting: Internal Medicine

## 2021-01-09 ENCOUNTER — Other Ambulatory Visit: Payer: Self-pay

## 2021-01-09 ENCOUNTER — Other Ambulatory Visit (HOSPITAL_COMMUNITY): Payer: Self-pay | Admitting: Internal Medicine

## 2021-01-09 DIAGNOSIS — M549 Dorsalgia, unspecified: Secondary | ICD-10-CM | POA: Diagnosis present

## 2021-06-23 ENCOUNTER — Encounter (HOSPITAL_COMMUNITY): Payer: Self-pay

## 2021-06-23 ENCOUNTER — Emergency Department (HOSPITAL_COMMUNITY): Payer: Medicaid Other

## 2021-06-23 ENCOUNTER — Other Ambulatory Visit: Payer: Self-pay

## 2021-06-23 ENCOUNTER — Emergency Department (HOSPITAL_COMMUNITY)
Admission: EM | Admit: 2021-06-23 | Discharge: 2021-06-23 | Disposition: A | Discharge status: Home / Self Care | Payer: Medicaid Other | Attending: Emergency Medicine | Admitting: Emergency Medicine

## 2021-06-23 DIAGNOSIS — I10 Essential (primary) hypertension: Secondary | ICD-10-CM | POA: Insufficient documentation

## 2021-06-23 DIAGNOSIS — Z79899 Other long term (current) drug therapy: Secondary | ICD-10-CM | POA: Diagnosis not present

## 2021-06-23 DIAGNOSIS — R0789 Other chest pain: Secondary | ICD-10-CM | POA: Insufficient documentation

## 2021-06-23 LAB — CBC
HCT: 45.3 % (ref 39.0–52.0)
Hemoglobin: 14.8 g/dL (ref 13.0–17.0)
MCH: 31.3 pg (ref 26.0–34.0)
MCHC: 32.7 g/dL (ref 30.0–36.0)
MCV: 95.8 fL (ref 80.0–100.0)
Platelets: 231 10*3/uL (ref 150–400)
RBC: 4.73 MIL/uL (ref 4.22–5.81)
RDW: 14.3 % (ref 11.5–15.5)
WBC: 4.5 10*3/uL (ref 4.0–10.5)
nRBC: 0 % (ref 0.0–0.2)

## 2021-06-23 LAB — BASIC METABOLIC PANEL
Anion gap: 9 (ref 5–15)
BUN: 13 mg/dL (ref 6–20)
CO2: 27 mmol/L (ref 22–32)
Calcium: 9.1 mg/dL (ref 8.9–10.3)
Chloride: 105 mmol/L (ref 98–111)
Creatinine, Ser: 1.12 mg/dL (ref 0.61–1.24)
GFR, Estimated: >60 mL/min (ref >60)
Glucose, Bld: 89 mg/dL (ref 70–99)
Potassium: 4.2 mmol/L (ref 3.5–5.1)
Sodium: 141 mmol/L (ref 135–145)

## 2021-06-23 LAB — TROPONIN I (HIGH SENSITIVITY)
Troponin I (High Sensitivity): 14 ng/L (ref <18)
Troponin I (High Sensitivity): 17 ng/L (ref <18)

## 2021-06-23 NOTE — ED Provider Notes (Addendum)
[facility] EMERGENCY DEPARTMENT Provider Note   CSN: [id number] Arrival date & time: 06/23/21  0545     History  Chief Complaint  Patient presents with   Chest Pain    Terrence Strickland is a 55 y.o. male.  Patient with complaint of left-sided chest pain that has been ongoing for 1 week.  Intermittent but last for long periods of time.  Feels as if something is crushing in his chest associated with shortness of breath worse with activity.  Patient has been seen for nonspecific or atypical chest pain in the past mostly throughout the month of May.  Patient denies any leg swelling.  Patient is asleep in the room.  No active chest pain currently.  Past medical history is significant for being a smoker alcohol abuse does appear that he supposed to be on lisinopril for hypertension.  Blood pressure here today 131/101.  Oxygen sats 94% temp 98.2.  Heart rate 76.      Home Medications Prior to Admission medications   Medication Sig Start Date End Date Taking? Authorizing Provider  lisinopril (ZESTRIL) 20 MG tablet Take 1 tablet (20 mg total) by mouth daily. 10/13/20   Milton Ferguson, MD  pantoprazole (PROTONIX) 20 MG tablet Take 1 tablet (20 mg total) by mouth daily. 10/13/20   Milton Ferguson, MD      Allergies    Nsaids and Tramadol    Review of Systems   Review of Systems  Constitutional:  Negative for chills and fever.  HENT:  Negative for ear pain and sore throat.   Eyes:  Negative for pain and visual disturbance.  Respiratory:  Negative for cough and shortness of breath.   Cardiovascular:  Negative for chest pain, palpitations and leg swelling.  Gastrointestinal:  Negative for abdominal pain and vomiting.  Genitourinary:  Negative for dysuria and hematuria.  Musculoskeletal:  Negative for arthralgias and back pain.  Skin:  Negative for color change and rash.  Neurological:  Negative for seizures and syncope.  All other systems reviewed and are negative.  Physical Exam Updated  Vital Signs BP (!) 151/112    Pulse 65    Temp 98.2 F (36.8 C)    Resp 15    Ht 1.803 m (5\' 11" )    Wt 68 kg    SpO2 100%    BMI 20.92 kg/m  Physical Exam Vitals and nursing note reviewed.  Constitutional:      General: He is not in acute distress.    Appearance: Normal appearance. He is well-developed.  HENT:     Head: Normocephalic and atraumatic.  Eyes:     Extraocular Movements: Extraocular movements intact.     Conjunctiva/sclera: Conjunctivae normal.     Pupils: Pupils are equal, round, and reactive to light.  Cardiovascular:     Rate and Rhythm: Normal rate and regular rhythm.     Heart sounds: No murmur heard. Pulmonary:     Effort: Pulmonary effort is normal. No respiratory distress.     Breath sounds: Normal breath sounds.  Chest:     Chest wall: No tenderness.  Abdominal:     Palpations: Abdomen is soft.     Tenderness: There is no abdominal tenderness.  Musculoskeletal:        General: No swelling.     Cervical back: Normal range of motion and neck supple.     Right lower leg: No edema.     Left lower leg: No edema.  Skin:  General: Skin is warm and dry.     Capillary Refill: Capillary refill takes less than 2 seconds.  Neurological:     General: No focal deficit present.     Mental Status: He is alert and oriented to person, place, and time.  Psychiatric:        Mood and Affect: Mood normal.    ED Results / Procedures / Treatments   Labs (all labs ordered are listed, but only abnormal results are displayed) Labs Reviewed  BASIC METABOLIC PANEL  CBC  TROPONIN I (HIGH SENSITIVITY)  TROPONIN I (HIGH SENSITIVITY)    EKG EKG Interpretation  Date/Time:  Friday June 23 2021 06:06:50 EST Ventricular Rate:  83 PR Interval:  162 QRS Duration: 90 QT Interval:  368 QTC Calculation: 433 R Axis:   29 Text Interpretation: Sinus rhythm Left ventricular hypertrophy Anterior Q waves, possibly due to LVH No significant change since 10/30/2020 Confirmed by  Veryl Speak [phone]) on 06/23/2021 6:11:31 AM  Radiology DG Chest Portable 1 View  Result Date: 06/23/2021 CLINICAL DATA:  Left-sided chest pain for the last few weeks on and off. EXAM: PORTABLE CHEST 1 VIEW COMPARISON:  10/26/2020 FINDINGS: The heart size and mediastinal contours are within normal limits. Both lungs are clear. The visualized skeletal structures are unremarkable. IMPRESSION: No active disease. Electronically Signed   By: Kathreen Devoid M.D.   On: 06/23/2021 06:56    Procedures Procedures  Cardiac monitoring shows sinus rhythm  Medications Ordered in ED Medications - No data to display  ED Course/ Medical Decision Making/ A&P                           Medical Decision Making Amount and/or Complexity of Data Reviewed Labs: ordered. Radiology: ordered.  Chest x-ray no acute findings.  Initial troponin normal at 14.  Does not need delta troponin since this pain has been ongoing for a week.  Not worse lately.  Patient metabolic panel is normal normal renal function no leukocytosis hemoglobin normal at 14.8. EKG without any significant changes.  Patient's blood pressure is elevated here.  Patient was treated with antihypertensive meds in the spring.  Patient states he is no longer taking them.  We will have him follow-up with his primary care doctor regarding this.  Will refer patient to cardiology for additional outpatient work-up.  No evidence of any acute cardiac event.  No concerns for pulmonary embolus no hypoxia no tachycardia.  Since pain has been ongoing for at least a week for single negative troponin is very reassuring.  Delta troponin not required.  Second troponin did come back prior to discharge.  It came back at 17 no significant delta change.  Stable for discharge.  Final Clinical Impression(s) / ED Diagnoses Final diagnoses:  Atypical chest pain  Primary hypertension    Rx / DC Orders ED Discharge Orders     None         Fredia Sorrow,  MD 06/23/21 [id number]    Fredia Sorrow, MD 06/23/21 [place], [place], MD 06/23/21 [phone]

## 2021-06-23 NOTE — Discharge Instructions (Signed)
Work-up for the chest pain without any acute findings.  Make an appointment to follow back up with your primary care doctor Dr. Legrand Rams and also recommend that you follow-up with cardiology.  Information provided above make an appointment.  Return for any new or worse symptoms.

## 2021-06-23 NOTE — ED Triage Notes (Addendum)
Pt complains of left sided chest pain for the last few weeks. Pain is intermittent and states it feels like someone is massaging and crushing his left chest. Endorses SOB when pain starts. Says its worse with activity.

## 2021-10-13 ENCOUNTER — Encounter (HOSPITAL_COMMUNITY): Payer: Self-pay | Admitting: Emergency Medicine

## 2021-10-13 ENCOUNTER — Emergency Department (HOSPITAL_COMMUNITY)
Admission: EM | Admit: 2021-10-13 | Discharge: 2021-10-13 | Disposition: A | Discharge status: Home / Self Care | Payer: Medicaid Other | Attending: Emergency Medicine | Admitting: Emergency Medicine

## 2021-10-13 ENCOUNTER — Emergency Department (HOSPITAL_COMMUNITY): Payer: Medicaid Other

## 2021-10-13 DIAGNOSIS — Y906 Blood alcohol level of 120-199 mg/100 ml: Secondary | ICD-10-CM | POA: Insufficient documentation

## 2021-10-13 DIAGNOSIS — F10120 Alcohol abuse with intoxication, uncomplicated: Secondary | ICD-10-CM | POA: Diagnosis not present

## 2021-10-13 DIAGNOSIS — R7401 Elevation of levels of liver transaminase levels: Secondary | ICD-10-CM | POA: Diagnosis not present

## 2021-10-13 DIAGNOSIS — F10920 Alcohol use, unspecified with intoxication, uncomplicated: Secondary | ICD-10-CM

## 2021-10-13 DIAGNOSIS — R072 Precordial pain: Secondary | ICD-10-CM | POA: Diagnosis not present

## 2021-10-13 DIAGNOSIS — R0789 Other chest pain: Secondary | ICD-10-CM | POA: Diagnosis present

## 2021-10-13 DIAGNOSIS — R1010 Upper abdominal pain, unspecified: Secondary | ICD-10-CM | POA: Diagnosis not present

## 2021-10-13 DIAGNOSIS — K2920 Alcoholic gastritis without bleeding: Secondary | ICD-10-CM

## 2021-10-13 LAB — BASIC METABOLIC PANEL
Anion gap: 11 (ref 5–15)
BUN: 12 mg/dL (ref 6–20)
CO2: 25 mmol/L (ref 22–32)
Calcium: 8.9 mg/dL (ref 8.9–10.3)
Chloride: 104 mmol/L (ref 98–111)
Creatinine, Ser: 0.86 mg/dL (ref 0.61–1.24)
GFR, Estimated: >60 mL/min (ref >60)
Glucose, Bld: 102 mg/dL — ABNORMAL HIGH (ref 70–99)
Potassium: 3.5 mmol/L (ref 3.5–5.1)
Sodium: 140 mmol/L (ref 135–145)

## 2021-10-13 LAB — HEPATIC FUNCTION PANEL
ALT: 29 U/L (ref 0–44)
AST: 45 U/L — ABNORMAL HIGH (ref 15–41)
Albumin: 4.1 g/dL (ref 3.5–5.0)
Alkaline Phosphatase: 78 U/L (ref 38–126)
Bilirubin, Direct: 0.1 mg/dL (ref 0.0–0.2)
Indirect Bilirubin: 0.8 mg/dL (ref 0.3–0.9)
Total Bilirubin: 0.9 mg/dL (ref 0.3–1.2)
Total Protein: 8.1 g/dL (ref 6.5–8.1)

## 2021-10-13 LAB — TROPONIN I (HIGH SENSITIVITY)
Troponin I (High Sensitivity): 12 ng/L (ref <18)
Troponin I (High Sensitivity): 14 ng/L (ref <18)

## 2021-10-13 LAB — CBC
HCT: 44.2 % (ref 39.0–52.0)
Hemoglobin: 14.6 g/dL (ref 13.0–17.0)
MCH: 29.9 pg (ref 26.0–34.0)
MCHC: 33 g/dL (ref 30.0–36.0)
MCV: 90.6 fL (ref 80.0–100.0)
Platelets: 331 10*3/uL (ref 150–400)
RBC: 4.88 MIL/uL (ref 4.22–5.81)
RDW: 13 % (ref 11.5–15.5)
WBC: 7.2 10*3/uL (ref 4.0–10.5)
nRBC: 0 % (ref 0.0–0.2)

## 2021-10-13 LAB — LIPASE, BLOOD: Lipase: 22 U/L (ref 11–51)

## 2021-10-13 LAB — ETHANOL: Alcohol, Ethyl (B): 160 mg/dL — ABNORMAL HIGH (ref <10)

## 2021-10-13 MED ORDER — FAMOTIDINE 20 MG PO TABS
40.0000 mg | ORAL_TABLET | Freq: Once | ORAL | Status: AC
  Administered 2021-10-13: 40 mg via ORAL
  Filled 2021-10-13: qty 2

## 2021-10-13 MED ORDER — FAMOTIDINE 20 MG PO TABS: 20.0000 mg | ORAL_TABLET | Freq: Two times a day (BID) | ORAL | 1 refills | Status: DC

## 2021-10-13 NOTE — ED Provider Notes (Addendum)
?[facility] ?Provider Note ? ? ?CSN: [id number] ?Arrival date & time: 10/13/21  0357 ? ?  ? ?History ? ?Chief Complaint  ?Patient presents with  ? Emesis  ? ? ?Terrence Strickland is a 56 y.o. male. ? ?Patient now more awake.  Complaining of some anterior chest pain.  Patient was brought in by EMS with a complaint of vomiting and upper abdominal pain.  Patient admitted to drinking alcohol.  Initially patient was aggressive to staff on arrival.  Patient now cooperative.  No further vomiting.  But with a complaint of substernal chest pain. ? ? ?  ? ?Home Medications ?Prior to Admission medications   ?Medication Sig Start Date End Date Taking? Authorizing Provider  ?lisinopril (ZESTRIL) 20 MG tablet Take 1 tablet (20 mg total) by mouth daily. 10/13/20   Bethann Berkshire, MD  ?pantoprazole (PROTONIX) 20 MG tablet Take 1 tablet (20 mg total) by mouth daily. 10/13/20   Bethann Berkshire, MD  ?   ? ?Allergies    ?Nsaids and Tramadol   ? ?Review of Systems   ?Review of Systems  ?Constitutional:  Negative for chills and fever.  ?HENT:  Negative for ear pain and sore throat.   ?Eyes:  Negative for pain and visual disturbance.  ?Respiratory:  Negative for cough and shortness of breath.   ?Cardiovascular:  Positive for chest pain. Negative for palpitations.  ?Gastrointestinal:  Positive for abdominal pain, nausea and vomiting.  ?Genitourinary:  Negative for dysuria and hematuria.  ?Musculoskeletal:  Negative for arthralgias and back pain.  ?Skin:  Negative for color change and rash.  ?Neurological:  Negative for seizures and syncope.  ?All other systems reviewed and are negative. ? ?Physical Exam ?Updated Vital Signs ?BP (!) 153/99   Pulse 80   Temp 98.5 ?F (36.9 ?C) (Oral)   Resp 14   Ht 1.803 m (5\' 11" )   Wt 68 kg   SpO2 95%   BMI 20.91 kg/m?  ?Physical Exam ?Vitals and nursing note reviewed.  ?Constitutional:   ?   General: He is not in acute distress. ?   Appearance: Normal appearance. He is well-developed.   ?HENT:  ?   Head: Normocephalic and atraumatic.  ?Eyes:  ?   Extraocular Movements: Extraocular movements intact.  ?   Conjunctiva/sclera: Conjunctivae normal.  ?   Pupils: Pupils are equal, round, and reactive to light.  ?Cardiovascular:  ?   Rate and Rhythm: Normal rate and regular rhythm.  ?   Heart sounds: No murmur heard. ?Pulmonary:  ?   Effort: Pulmonary effort is normal. No respiratory distress.  ?   Breath sounds: Normal breath sounds.  ?Abdominal:  ?   General: There is no distension.  ?   Palpations: Abdomen is soft.  ?   Tenderness: There is no abdominal tenderness. There is no guarding.  ?Musculoskeletal:     ?   General: No swelling.  ?   Cervical back: Normal range of motion and neck supple.  ?   Right lower leg: No edema.  ?   Left lower leg: No edema.  ?Skin: ?   General: Skin is warm and dry.  ?   Capillary Refill: Capillary refill takes less than 2 seconds.  ?Neurological:  ?   Mental Status: He is alert.  ?Psychiatric:     ?   Mood and Affect: Mood normal.  ? ? ?ED Results / Procedures / Treatments   ?Labs ?(all labs ordered are listed, but only abnormal  results are displayed) ?Labs Reviewed  ?BASIC METABOLIC PANEL - Abnormal; Notable for the following components:  ?    Result Value  ? Glucose, Bld 102 (*)   ? All other components within normal limits  ?ETHANOL - Abnormal; Notable for the following components:  ? Alcohol, Ethyl (B) 160 (*)   ? All other components within normal limits  ?HEPATIC FUNCTION PANEL - Abnormal; Notable for the following components:  ? AST 45 (*)   ? All other components within normal limits  ?CBC  ?LIPASE, BLOOD  ?TROPONIN I (HIGH SENSITIVITY)  ? ? ?EKG ?EKG Interpretation ? ?Date/Time:  Friday Oct 13 2021 09:03:28 EDT ?Ventricular Rate:  91 ?PR Interval:  152 ?QRS Duration: 91 ?QT Interval:  397 ?QTC Calculation: 489 ?R Axis:   54 ?Text Interpretation: Sinus rhythm Borderline prolonged QT interval Confirmed by Vanetta Mulders [phone]) on 10/13/2021 9:36:50  AM ? ?Radiology ?No results found. ? ?Procedures ?Procedures  ? ? ?Medications Ordered in ED ?Medications - No data to display ? ?ED Course/ Medical Decision Making/ A&P ?  ?                        ?Medical Decision Making ?Amount and/or Complexity of Data Reviewed ?Labs: ordered. ?Radiology: ordered. ? ? ?Patient now with a complaint of substernal chest pain.  That he states has been going on all night.  But it was an initial complaint also I spoke with him at about 715 this morning and he just wanted to sleep more.  Did not have any complaints at that time.  Patient had not had any further vomiting.  Patient had already been here for several hours prior to me seeing him. ? ?EKG here without any acute findings other than some borderline prolonged QT. ? ?Patient's labs otherwise blood alcohol level was 160 lipase normal hepatic function test normal other than AST at 45.  Basic metabolic panel without any acute findings CBC normal no leukocytosis hemoglobin 14.6. ? ?We will go ahead and add on troponins and get a chest x-ray.  If negative patient should be stable for discharge home. ? ?Troponins x2 without any significant abnormalities.  Patient also given some Pepcid because he started to complaining about some stomach and esophageal burning.  Patient ambulating fine here. ? ?Patient stable for discharge home. ?Final Clinical Impression(s) / ED Diagnoses ?Final diagnoses:  ?Alcoholic intoxication without complication (HCC)  ?Precordial pain  ? ? ?Rx / DC Orders ?ED Discharge Orders   ? ? None  ? ?  ? ? ?  ?Vanetta Mulders, MD ?10/13/21 [phone] ? ?  ?Vanetta Mulders, MD ?10/13/21 1216 ? ?

## 2021-10-13 NOTE — ED Notes (Signed)
Pt had one large episode of emesis onto floor ?

## 2021-10-13 NOTE — Discharge Instructions (Signed)
Follow-up with your primary care doctor.  Take the Pepcid for the gastritis as directed for the next 7 days.  Chest pain work-up without any acute findings. ?

## 2021-10-13 NOTE — ED Triage Notes (Signed)
Pt c/o vomiting and upper abd pain after consuming an excess of ETOH tonight. Pt aggressive to staff on arrival.  ?

## 2021-10-13 NOTE — ED Notes (Addendum)
Pt woke from sleep. RN went in room to round on pt. Pt c/o aching, mid chest pain, 7/10. Also c/o SOB and nausea. Denies dizziness, diaphoresis. EKG performed. Pt requesting pain medication for his chest. EKG given to Dr. Deretha Emory and notified of pt's symptoms and request.  ?

## 2021-10-18 ENCOUNTER — Observation Stay (HOSPITAL_COMMUNITY)
Admission: EM | Admit: 2021-10-18 | Discharge: 2021-10-20 | Disposition: A | Discharge status: Home / Self Care | Payer: Medicaid Other | Attending: Internal Medicine | Admitting: Internal Medicine

## 2021-10-18 ENCOUNTER — Other Ambulatory Visit: Payer: Self-pay

## 2021-10-18 ENCOUNTER — Emergency Department (HOSPITAL_COMMUNITY): Payer: Medicaid Other

## 2021-10-18 DIAGNOSIS — F10129 Alcohol abuse with intoxication, unspecified: Secondary | ICD-10-CM | POA: Diagnosis not present

## 2021-10-18 DIAGNOSIS — K920 Hematemesis: Secondary | ICD-10-CM | POA: Diagnosis not present

## 2021-10-18 DIAGNOSIS — F17210 Nicotine dependence, cigarettes, uncomplicated: Secondary | ICD-10-CM | POA: Insufficient documentation

## 2021-10-18 DIAGNOSIS — K2210 Ulcer of esophagus without bleeding: Secondary | ICD-10-CM | POA: Diagnosis not present

## 2021-10-18 DIAGNOSIS — Y902 Blood alcohol level of 40-59 mg/100 ml: Secondary | ICD-10-CM | POA: Diagnosis not present

## 2021-10-18 DIAGNOSIS — E876 Hypokalemia: Secondary | ICD-10-CM | POA: Insufficient documentation

## 2021-10-18 DIAGNOSIS — Z79899 Other long term (current) drug therapy: Secondary | ICD-10-CM | POA: Diagnosis not present

## 2021-10-18 DIAGNOSIS — K449 Diaphragmatic hernia without obstruction or gangrene: Secondary | ICD-10-CM | POA: Insufficient documentation

## 2021-10-18 DIAGNOSIS — R079 Chest pain, unspecified: Secondary | ICD-10-CM | POA: Insufficient documentation

## 2021-10-18 DIAGNOSIS — F10929 Alcohol use, unspecified with intoxication, unspecified: Secondary | ICD-10-CM | POA: Diagnosis present

## 2021-10-18 DIAGNOSIS — F10920 Alcohol use, unspecified with intoxication, uncomplicated: Secondary | ICD-10-CM

## 2021-10-18 DIAGNOSIS — R1111 Vomiting without nausea: Secondary | ICD-10-CM | POA: Diagnosis present

## 2021-10-18 DIAGNOSIS — F1010 Alcohol abuse, uncomplicated: Secondary | ICD-10-CM | POA: Diagnosis not present

## 2021-10-18 HISTORY — DX: Essential (primary) hypertension: I10

## 2021-10-18 LAB — URINALYSIS, ROUTINE W REFLEX MICROSCOPIC
Bilirubin Urine: NEGATIVE
Glucose, UA: NEGATIVE mg/dL
Ketones, ur: NEGATIVE mg/dL
Leukocytes,Ua: NEGATIVE
Nitrite: NEGATIVE
Protein, ur: NEGATIVE mg/dL
Specific Gravity, Urine: 1.008 (ref 1.005–1.030)
pH: 6 (ref 5.0–8.0)

## 2021-10-18 LAB — COMPREHENSIVE METABOLIC PANEL
ALT: 22 U/L (ref 0–44)
AST: 32 U/L (ref 15–41)
Albumin: 4 g/dL (ref 3.5–5.0)
Alkaline Phosphatase: 68 U/L (ref 38–126)
Anion gap: 14 (ref 5–15)
BUN: 10 mg/dL (ref 6–20)
CO2: 27 mmol/L (ref 22–32)
Calcium: 9.1 mg/dL (ref 8.9–10.3)
Chloride: 96 mmol/L — ABNORMAL LOW (ref 98–111)
Creatinine, Ser: 0.88 mg/dL (ref 0.61–1.24)
GFR, Estimated: >60 mL/min (ref >60)
Glucose, Bld: 116 mg/dL — ABNORMAL HIGH (ref 70–99)
Potassium: 3.2 mmol/L — ABNORMAL LOW (ref 3.5–5.1)
Sodium: 137 mmol/L (ref 135–145)
Total Bilirubin: 0.6 mg/dL (ref 0.3–1.2)
Total Protein: 7.7 g/dL (ref 6.5–8.1)

## 2021-10-18 LAB — TROPONIN I (HIGH SENSITIVITY)
Troponin I (High Sensitivity): 7 ng/L (ref <18)
Troponin I (High Sensitivity): 7 ng/L (ref <18)

## 2021-10-18 LAB — MAGNESIUM: Magnesium: 2.1 mg/dL (ref 1.7–2.4)

## 2021-10-18 LAB — LIPASE, BLOOD: Lipase: 21 U/L (ref 11–51)

## 2021-10-18 LAB — CBC
HCT: 44.7 % (ref 39.0–52.0)
Hemoglobin: 14.5 g/dL (ref 13.0–17.0)
MCH: 29.4 pg (ref 26.0–34.0)
MCHC: 32.4 g/dL (ref 30.0–36.0)
MCV: 90.7 fL (ref 80.0–100.0)
Platelets: 335 10*3/uL (ref 150–400)
RBC: 4.93 MIL/uL (ref 4.22–5.81)
RDW: 13 % (ref 11.5–15.5)
WBC: 6.8 10*3/uL (ref 4.0–10.5)
nRBC: 0 % (ref 0.0–0.2)

## 2021-10-18 LAB — PHOSPHORUS: Phosphorus: 2.9 mg/dL (ref 2.5–4.6)

## 2021-10-18 LAB — GASTRIC OCCULT BLOOD (1-CARD TO LAB)
Occult Blood, Gastric: POSITIVE — AB
pH, Gastric: 4

## 2021-10-18 LAB — HEMOGLOBIN AND HEMATOCRIT, BLOOD
HCT: 43.9 % (ref 39.0–52.0)
Hemoglobin: 14.3 g/dL (ref 13.0–17.0)

## 2021-10-18 LAB — ETHANOL: Alcohol, Ethyl (B): 51 mg/dL — ABNORMAL HIGH (ref <10)

## 2021-10-18 MED ORDER — PROMETHAZINE HCL 12.5 MG PO TABS: 12.5000 mg | ORAL_TABLET | Freq: Four times a day (QID) | ORAL | Status: DC | PRN

## 2021-10-18 MED ORDER — ONDANSETRON HCL 4 MG/2ML IJ SOLN
4.0000 mg | Freq: Once | INTRAMUSCULAR | Status: AC
  Administered 2021-10-18: 4 mg via INTRAVENOUS
  Filled 2021-10-18: qty 2

## 2021-10-18 MED ORDER — POTASSIUM CHLORIDE CRYS ER 20 MEQ PO TBCR
40.0000 meq | EXTENDED_RELEASE_TABLET | Freq: Once | ORAL | Status: AC
  Administered 2021-10-18: 40 meq via ORAL
  Filled 2021-10-18: qty 2

## 2021-10-18 MED ORDER — LIDOCAINE VISCOUS HCL 2 % MT SOLN
15.0000 mL | Freq: Once | OROMUCOSAL | Status: AC
  Administered 2021-10-18: 15 mL via ORAL
  Filled 2021-10-18: qty 15

## 2021-10-18 MED ORDER — ACETAMINOPHEN 325 MG PO TABS
650.0000 mg | ORAL_TABLET | Freq: Four times a day (QID) | ORAL | Status: DC | PRN
Start: 2021-10-18 — End: 2021-10-20
  Administered 2021-10-20: 650 mg via ORAL
  Filled 2021-10-18 (×2): qty 2

## 2021-10-18 MED ORDER — POTASSIUM CHLORIDE 10 MEQ/100ML IV SOLN
10.0000 meq | Freq: Once | INTRAVENOUS | Status: AC
  Administered 2021-10-18: 10 meq via INTRAVENOUS
  Filled 2021-10-18: qty 100

## 2021-10-18 MED ORDER — PANTOPRAZOLE SODIUM 40 MG IV SOLR
40.0000 mg | Freq: Once | INTRAVENOUS | Status: AC
  Administered 2021-10-18: 40 mg via INTRAVENOUS
  Filled 2021-10-18: qty 10

## 2021-10-18 MED ORDER — PANTOPRAZOLE SODIUM 40 MG IV SOLR
40.0000 mg | Freq: Two times a day (BID) | INTRAVENOUS | Status: DC
  Administered 2021-10-19 – 2021-10-20 (×4): 40 mg via INTRAVENOUS
  Filled 2021-10-18 (×4): qty 10

## 2021-10-18 MED ORDER — CHLORPROMAZINE HCL 25 MG PO TABS
25.0000 mg | ORAL_TABLET | Freq: Once | ORAL | Status: AC
  Administered 2021-10-18: 25 mg via ORAL
  Filled 2021-10-18: qty 1

## 2021-10-18 MED ORDER — POTASSIUM CHLORIDE IN NACL 40-0.9 MEQ/L-% IV SOLN
INTRAVENOUS | Status: DC
  Filled 2021-10-18 (×3): qty 1000

## 2021-10-18 MED ORDER — ONDANSETRON HCL 4 MG/2ML IJ SOLN: 4.0000 mg | Freq: Three times a day (TID) | INTRAMUSCULAR | Status: DC | PRN

## 2021-10-18 MED ORDER — ALUM & MAG HYDROXIDE-SIMETH 200-200-20 MG/5ML PO SUSP
30.0000 mL | Freq: Once | ORAL | Status: AC
  Administered 2021-10-18: 30 mL via ORAL
  Filled 2021-10-18: qty 30

## 2021-10-18 MED ORDER — ACETAMINOPHEN 650 MG RE SUPP: 650.0000 mg | Freq: Four times a day (QID) | RECTAL | Status: DC | PRN

## 2021-10-18 MED ORDER — SODIUM CHLORIDE 0.9 % IV BOLUS
1000.0000 mL | Freq: Once | INTRAVENOUS | Status: AC
  Administered 2021-10-18: 1000 mL via INTRAVENOUS

## 2021-10-18 MED ORDER — IOHEXOL 300 MG/ML  SOLN
100.0000 mL | Freq: Once | INTRAMUSCULAR | Status: AC | PRN
  Administered 2021-10-18: 100 mL via INTRAVENOUS

## 2021-10-18 MED ORDER — POLYETHYLENE GLYCOL 3350 17 G PO PACK: 17.0000 g | PACK | Freq: Every day | ORAL | Status: DC | PRN

## 2021-10-18 NOTE — Assessment & Plan Note (Signed)
Chest pain with upper epigastric pain in the setting of vomiting melena hematemesis.  Likely GI related.   EKG unremarkable, suggest LVH. ?-Trend troponin ?

## 2021-10-18 NOTE — Assessment & Plan Note (Signed)
Daily alcohol leak beverage intake.  Last drink was this morning.  Blood alcohol level of 51.  On average he drinks about 4 beers daily. ?-CIWA protocol as needed ?-Check magnesium,  Phosphorus ?- Thiamine folate multivitamin ?

## 2021-10-18 NOTE — Assessment & Plan Note (Addendum)
From vomiting, alcohol abuse. ?-Check magnesium ?-Replete ?

## 2021-10-18 NOTE — H&P (Addendum)
?History and Physical  ? ? ?Terrence Strickland:[id number] DOB: 05-20-1966 DOA: 10/18/2021 ? ?PCP: Benetta Spar, MD  ? ?Patient coming from: Home ? ?I have personally briefly reviewed patient's old medical records in [facility] ? ?Chief Complaint: Vomiting ? ?HPI: Terrence Strickland is a 56 y.o. male with medical history significant for alcohol abuse, depression. ?Patient presented to the ED with complaints of 1 week of vomiting.  He also reports lower chest and upper abdominal burning pain over the past few days.  Initially vomitus was yellowish but 2 days ago he noticed intermittent dark vomitus.  He has had about 3 episodes of this last episode was here in the ED.  He also reports black stools that started about 10 days ago.  ?Patient was recently incarcerated and released 10 days ago. ?Was in the ED 5/10 with reports of vomiting at that time the dark vomitus had not started.  Blood alcohol level was 160.  He was diagnosed with alcoholic gastritis, discharged on Pepcid.  He has been taking over-the-counter medications with no improvement in symptoms his presentation today. ?He takes over-the-counter Aleve 3 to 4 pills a week. ?He drinks alcoholic beverages every day, on average 4 beers a day, his last alcoholic drink was this morning. ? ?He reports persistent hiccups since yesterday. ? ?ED Course: Blood pressure 140s to 160s.  Heart rate 57 to 100.  Hemoglobin stable 14.5.  Potassium 3.2.  CT abdomen and pelvis with contrast no acute abnormality, suggests hepatic steatosis, nodule of colon as well as fat in the right inguinal hernia. ?IV Protonix given. ?IV Thorazine x2 given  ?1 L bolus given. ?Hospitalist to admit for possible GI bleed. ? ?Review of Systems: As per HPI all other systems reviewed and negative. ? ?Past Medical History:  ?Diagnosis Date  ? ADHD (attention deficit hyperactivity disorder)   ? Alcohol abuse   ? Depression   ? Irregular heart beat   ? ? ?No past surgical history on file. ? ?  reports that he has been smoking cigarettes. He has a 7.50 pack-year smoking history. He has never used smokeless tobacco. He reports current alcohol use. He reports that he does not use drugs. ? ?Allergies  ?Allergen Reactions  ? Nsaids Other (See Comments)  ?  hallucinations  ? Tramadol Other (See Comments)  ?  Chills, hallucinations, sweating ? ?  ? ? ?Family History  ?Problem Relation Age of Onset  ? Cancer Other   ? Diabetes Other   ? ? ?Prior to Admission medications   ?Medication Sig Start Date End Date Taking? Authorizing Provider  ?famotidine (PEPCID) 20 MG tablet Take 1 tablet (20 mg total) by mouth 2 (two) times daily. ?Patient not taking: Reported on 10/18/2021 10/13/21   Vanetta Mulders, MD  ?lisinopril (ZESTRIL) 20 MG tablet Take 1 tablet (20 mg total) by mouth daily. ?Patient not taking: Reported on 10/18/2021 10/13/20   Bethann Berkshire, MD  ?pantoprazole (PROTONIX) 20 MG tablet Take 1 tablet (20 mg total) by mouth daily. ?Patient not taking: Reported on 10/18/2021 10/13/20   Bethann Berkshire, MD  ? ? ?Physical Exam: ?Vitals:  ? 10/18/21 1300 10/18/21 1315 10/18/21 1452 10/18/21 1531  ?BP: (!) 162/105 (!) 142/105 (!) 156/105 (!) 147/97  ?Pulse: 61 (!) 57 68 67  ?Resp:    18  ?Temp:      ?TempSrc:      ?SpO2: 97% 96%  99%  ?Weight:      ?Height:      ? ? ?  Constitutional: NAD, calm, comfortable ?Vitals:  ? 10/18/21 1300 10/18/21 1315 10/18/21 1452 10/18/21 1531  ?BP: (!) 162/105 (!) 142/105 (!) 156/105 (!) 147/97  ?Pulse: 61 (!) 57 68 67  ?Resp:    18  ?Temp:      ?TempSrc:      ?SpO2: 97% 96%  99%  ?Weight:      ?Height:      ? ?Eyes: PERRL, lids and conjunctivae normal ?ENMT: Mucous membranes are moist.   ?Neck: normal, supple, no masses, no thyromegaly ?Respiratory: clear to auscultation bilaterally, no wheezing, no crackles. Normal respiratory effort. No accessory muscle use.  ?Cardiovascular: Regular rate and rhythm, no murmurs / rubs / gallops. No extremity edema.  Lower extremities warm  ?abdomen: no  tenderness, no masses palpated. No hepatosplenomegaly. Bowel sounds positive.  ?Musculoskeletal: no clubbing / cyanosis. No joint deformity upper and lower extremities. Good ROM, no contractures. Normal muscle tone.  ?Skin: no rashes, lesions, ulcers. No induration ?Neurologic: No apparent cranial nerve abnormality moving extremities spontaneously.  ?Psychiatric: Normal judgment and insight. Alert and oriented x 3. Normal mood.  ? ?Labs on Admission: I have personally reviewed following labs and imaging studies ? ?CBC: ?Recent Labs  ?Lab 10/13/21 ?0457 10/18/21 ?0940  ?WBC 7.2 6.8  ?HGB 14.6 14.5  ?HCT 44.2 44.7  ?MCV 90.6 90.7  ?PLT 331 335  ? ?Basic Metabolic Panel: ?Recent Labs  ?Lab 10/13/21 ?0457 10/18/21 ?0940  ?NA 140 137  ?K 3.5 3.2*  ?CL 104 96*  ?CO2 25 27  ?GLUCOSE 102* 116*  ?BUN 12 10  ?CREATININE 0.86 0.88  ?CALCIUM 8.9 9.1  ? ? ?Liver Function Tests: ?Recent Labs  ?Lab 10/13/21 ?0457 10/18/21 ?0940  ?AST 45* 32  ?ALT 29 22  ?ALKPHOS 78 68  ?BILITOT 0.9 0.6  ?PROT 8.1 7.7  ?ALBUMIN 4.1 4.0  ? ?Recent Labs  ?Lab 10/13/21 ?0457 10/18/21 ?0940  ?LIPASE 22 21  ? ?Urine analysis: ?   ?Component Value Date/Time  ? COLORURINE YELLOW 10/18/2021 0907  ? APPEARANCEUR CLEAR 10/18/2021 0907  ? LABSPEC 1.008 10/18/2021 0907  ? PHURINE 6.0 10/18/2021 0907  ? GLUCOSEU NEGATIVE 10/18/2021 0907  ? HGBUR SMALL (A) 10/18/2021 0907  ? BILIRUBINUR NEGATIVE 10/18/2021 0907  ? KETONESUR NEGATIVE 10/18/2021 0907  ? PROTEINUR NEGATIVE 10/18/2021 0907  ? NITRITE NEGATIVE 10/18/2021 0907  ? LEUKOCYTESUR NEGATIVE 10/18/2021 0907  ? ? ?Radiological Exams on Admission: ?CT ABDOMEN PELVIS W CONTRAST ? ?Result Date: 10/18/2021 ?CLINICAL DATA:  Abdominal pain, nausea, vomiting, hiccups and hematemesis. EXAM: CT ABDOMEN AND PELVIS WITH CONTRAST TECHNIQUE: Multidetector CT imaging of the abdomen and pelvis was performed using the standard protocol following bolus administration of intravenous contrast. RADIATION DOSE REDUCTION: This exam  was performed according to the departmental dose-optimization program which includes automated exposure control, adjustment of the mA and/or kV according to patient size and/or use of iterative reconstruction technique. CONTRAST:  100mL OMNIPAQUE IOHEXOL 300 MG/ML  SOLN COMPARISON:  None. FINDINGS: Lower chest: Minimal dependent atelectasis bilaterally. Heart is enlarged. No pericardial effusion. Trace right pleural fluid. Distal esophageal wall thickening can be seen with gastroesophageal reflux. Small hiatal hernia. Hepatobiliary: Liver is slightly decreased in attenuation diffusely. Liver and gallbladder are otherwise unremarkable. No biliary ductal dilatation. Pancreas: Negative. Spleen: Negative. Adrenals/Urinary Tract: Adrenal glands are unremarkable. 2.0 cm low-attenuation lesion in the upper pole right kidney is indicative of a cyst. Apparent soft tissue density along the ventral margin is felt to be due to volume averaging with renal parenchyma. Additional  low-attenuation lesions in the kidneys are too small to characterize but statistically, cysts are likely. No follow-up of these lesions is necessary. Ureters are decompressed. Bladder is grossly unremarkable. Stomach/Bowel: Small hiatal hernia. Stomach, small bowel, appendix and colon are otherwise unremarkable. Vascular/Lymphatic: Atherosclerotic calcification of the aorta. No pathologically enlarged lymph nodes. Reproductive: Prostate is visualized. Other: Right inguinal hernia contains a knuckle of colon as well as fat. Mesenteries and peritoneum are otherwise unremarkable. Musculoskeletal: Degenerative changes in the spine and hips. No worrisome lytic or sclerotic lesions. IMPRESSION: 1. No findings to explain the patient's clinical history. 2. Trace right pleural effusion. 3. Hepatic steatosis. 4. Right inguinal hernia contains a knuckle of colon as well as fat. 5.  Aortic atherosclerosis (ICD10-I70.0). Electronically Signed   By: Leanna Battles  M.D.   On: 10/18/2021 15:33   ? ?EKG: Independently reviewed.  Sinus rhythm rate 71, QTc 491.  LVH. ? ?Assessment/Plan ?Principal Problem: ?  Hematemesis ?Active Problems: ?  Chest pain ?  Alcohol abuse ?  Hy

## 2021-10-18 NOTE — Assessment & Plan Note (Signed)
Hematemesis with melena.  Gastroccult positive.  Differentials include alcoholic gastritis, peptic ulcer disease, Mallory-Weiss tear.  Stable at 14.5.  Vital stable.  No suggestion on imaging or on exam of liver cirrhosis.  Positive NSAID use. ?-IV Protonix 40 twice daily ?-GI consult in the morning ?-Clear liquid diet, n.p.o. midnight ?-Trend hemoglobin ?-1 L bolus given, continue N/s + 40 KCL 100cc/hr x 15hrs ?

## 2021-10-18 NOTE — ED Triage Notes (Signed)
Patient with complaints of vomiting for a week. ?

## 2021-10-18 NOTE — ED Provider Notes (Signed)
?[name] ?Provider Note ? ? ?CSN: [id number] ?Arrival date & time: 10/18/21  0857 ? ?  ? ?History ? ?Chief Complaint  ?Patient presents with  ? Emesis  ? ? ?Terrence Strickland is a 56 y.o. male. ? ? ?Emesis ?Associated symptoms: abdominal pain   ?Associated symptoms: no diarrhea and no fever   ? ?  ? ? ?Terrence Strickland is a 56 y.o. male past medical history of chest pain chronic alcohol abuse, depression, and arrhythmia.  Who presents to the Emergency Department complaining of persistent vomiting x1 week.  States he is having burning pain of his chest and upper abdomen.  He was seen here on 10/13/2021 for similar symptoms.  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  He was advised that he had reflux symptoms, since his prior ER visit he has been taking Pepto-Bismol, Tums, Rolaids, Maalox and notes that he is now vomiting dark black substance.  He is unable to keep down any foods or liquids.  He also notes having persistent hiccups since yesterday.  He denies any diarrhea, fever, chills, shortness of breath, syncope or dizziness. ? ?Patient states he was incarcerated and recently released.  Since his release, he has been drinking liquor and beer.  States his last drink was approximately 1 week ago. ? ? ? ?Home Medications ?Prior to Admission medications   ?Medication Sig Start Date End Date Taking? Authorizing Provider  ?famotidine (PEPCID) 20 MG tablet Take 1 tablet (20 mg total) by mouth 2 (two) times daily. 10/13/21   Vanetta MuldersZackowski, Scott, MD  ?lisinopril (ZESTRIL) 20 MG tablet Take 1 tablet (20 mg total) by mouth daily. 10/13/20   Bethann BerkshireZammit, Joseph, MD  ?pantoprazole (PROTONIX) 20 MG tablet Take 1 tablet (20 mg total) by mouth daily. 10/13/20   Bethann BerkshireZammit, Joseph, MD  ?   ? ?Allergies    ?Nsaids and Tramadol   ? ?Review of Systems   ?Review of Systems  ?Constitutional:  Positive for appetite change. Negative for fever.  ?Respiratory:  Negative for chest tightness and shortness of breath.    ?Cardiovascular:  Positive for chest pain.  ?Gastrointestinal:  Positive for abdominal pain, nausea and vomiting. Negative for diarrhea.  ?Neurological:  Negative for dizziness, syncope and weakness.  ? ?Physical Exam ?Updated Vital Signs ?BP (!) 155/106 (BP Location: Right Arm)   Pulse 100   Temp 98.8 ?F (37.1 ?C) (Oral)   Resp 20   Ht 5\' 11"  (1.803 m)   Wt 68 kg   SpO2 98%   BMI 20.92 kg/m?  ?Physical Exam ?Vitals and nursing note reviewed.  ?Constitutional:   ?   Appearance: Normal appearance. He is not ill-appearing.  ?   Comments: Patient uncomfortable appearing  ?HENT:  ?   Mouth/Throat:  ?   Mouth: Mucous membranes are moist.  ?Cardiovascular:  ?   Rate and Rhythm: Normal rate and regular rhythm.  ?   Pulses: Normal pulses.  ?Pulmonary:  ?   Effort: Pulmonary effort is normal. No respiratory distress.  ?   Breath sounds: Normal breath sounds.  ?Abdominal:  ?   Palpations: Abdomen is soft.  ?   Tenderness: There is abdominal tenderness.  ?   Comments: Mild tenderness palpation of the epigastric area.  No guarding or rebound.  Abdomen is soft.  No tenderness of the lower abdomen.  ?Musculoskeletal:     ?   General: Normal range of motion.  ?   Right lower leg: No edema.  ?  Left lower leg: No edema.  ?Skin: ?   General: Skin is warm.  ?   Capillary Refill: Capillary refill takes less than 2 seconds.  ?   Findings: No erythema or rash.  ?Neurological:  ?   General: No focal deficit present.  ?   Mental Status: He is alert.  ?   Sensory: No sensory deficit.  ?   Motor: No weakness.  ? ? ?ED Results / Procedures / Treatments   ?Labs ?(all labs ordered are listed, but only abnormal results are displayed) ?Labs Reviewed  ?COMPREHENSIVE METABOLIC PANEL - Abnormal; Notable for the following components:  ?    Result Value  ? Potassium 3.2 (*)   ? Chloride 96 (*)   ? Glucose, Bld 116 (*)   ? All other components within normal limits  ?LIPASE, BLOOD  ?CBC  ?URINALYSIS, ROUTINE W REFLEX MICROSCOPIC  ? ? ?EKG ?EKG  Interpretation ? ?Date/Time:  Wednesday Oct 18 2021 14:36:14 EDT ?Ventricular Rate:  71 ?PR Interval:  156 ?QRS Duration: 94 ?QT Interval:  451 ?QTC Calculation: 491 ?R Axis:   51 ?Text Interpretation: Sinus rhythm Left ventricular hypertrophy ST elevation, consider anterolateral injury Confirmed by Alvester Chou [phone]) on 10/18/2021 2:55:10 PM ? ?Radiology ?CT ABDOMEN PELVIS W CONTRAST ? ?Result Date: 10/18/2021 ?CLINICAL DATA:  Abdominal pain, nausea, vomiting, hiccups and hematemesis. EXAM: CT ABDOMEN AND PELVIS WITH CONTRAST TECHNIQUE: Multidetector CT imaging of the abdomen and pelvis was performed using the standard protocol following bolus administration of intravenous contrast. RADIATION DOSE REDUCTION: This exam was performed according to the departmental dose-optimization program which includes automated exposure control, adjustment of the mA and/or kV according to patient size and/or use of iterative reconstruction technique. CONTRAST:  OMNIPAQUE IOHEXOL 300 MG/ML  SOLN COMPARISON:  None. FINDINGS: Lower chest: Minimal dependent atelectasis bilaterally. Heart is enlarged. No pericardial effusion. Trace right pleural fluid. Distal esophageal wall thickening can be seen with gastroesophageal reflux. Small hiatal hernia. Hepatobiliary: Liver is slightly decreased in attenuation diffusely. Liver and gallbladder are otherwise unremarkable. No biliary ductal dilatation. Pancreas: Negative. Spleen: Negative. Adrenals/Urinary Tract: Adrenal glands are unremarkable. 2.0 cm low-attenuation lesion in the upper pole right kidney is indicative of a cyst. Apparent soft tissue density along the ventral margin is felt to be due to volume averaging with renal parenchyma. Additional low-attenuation lesions in the kidneys are too small to characterize but statistically, cysts are likely. No follow-up of these lesions is necessary. Ureters are decompressed. Bladder is grossly unremarkable. Stomach/Bowel: Small hiatal  hernia. Stomach, small bowel, appendix and colon are otherwise unremarkable. Vascular/Lymphatic: Atherosclerotic calcification of the aorta. No pathologically enlarged lymph nodes. Reproductive: Prostate is visualized. Other: Right inguinal hernia contains a knuckle of colon as well as fat. Mesenteries and peritoneum are otherwise unremarkable. Musculoskeletal: Degenerative changes in the spine and hips. No worrisome lytic or sclerotic lesions. IMPRESSION: 1. No findings to explain the patient's clinical history. 2. Trace right pleural effusion. 3. Hepatic steatosis. 4. Right inguinal hernia contains a knuckle of colon as well as fat. 5.  Aortic atherosclerosis (ICD10-I70.0). Electronically Signed   By: Leanna Battles M.D.   On: 10/18/2021 15:33   ? ?Procedures ?Procedures  ? ? ?Medications Ordered in ED ?Medications  ?sodium chloride 0.9 % bolus 1,000 mL (has no administration in time range)  ?ondansetron (ZOFRAN) injection 4 mg (has no administration in time range)  ?pantoprazole (PROTONIX) injection 40 mg (has no administration in time range)  ? ? ?ED Course/ Medical Decision Making/  A&P ?  ?                        ?Medical Decision Making ?Patient returns to the ER today with persistent burning chest pain and abdominal pain.  Now having vomiting since last ER arrival.  Vomitus appears black per patient.  Unable to keep down any liquids or solid foods for several days.  Admits to history of chronic alcohol use.  Was incarcerated and recently released has been drinking beer and liquor since his release date, states he last drink 1 week ago ? ?Patient was seen here on 10/13/2021 for same symptoms.  Had cardiac work-up that time without evidence of ACS.  Today, he complains of lower chest and upper abdominal pain.  Describes chest pain as burning. ? ?On exam, patient nontoxic-appearing.  Vital signs reviewed.  He has emesis bag with dark-colored vomitus noted.  No active vomiting during my exam.  Some epigastric  tenderness without guarding or rebound.  No peritoneal signs.  Patient has persistent hiccups and is requesting medication for his symptoms. ? ? ? ?Amount and/or Complexity of Data Reviewed ?External Data Reviewed:

## 2021-10-19 ENCOUNTER — Encounter (HOSPITAL_COMMUNITY)
Admission: EM | Disposition: A | Discharge status: Home / Self Care | Payer: Self-pay | Source: Home / Self Care | Attending: Emergency Medicine

## 2021-10-19 ENCOUNTER — Observation Stay (HOSPITAL_COMMUNITY): Payer: Medicaid Other | Admitting: Certified Registered"

## 2021-10-19 ENCOUNTER — Encounter (HOSPITAL_COMMUNITY): Payer: Self-pay | Admitting: Internal Medicine

## 2021-10-19 DIAGNOSIS — F1010 Alcohol abuse, uncomplicated: Secondary | ICD-10-CM | POA: Diagnosis not present

## 2021-10-19 DIAGNOSIS — K920 Hematemesis: Secondary | ICD-10-CM | POA: Diagnosis not present

## 2021-10-19 HISTORY — PX: ESOPHAGOGASTRODUODENOSCOPY (EGD) WITH PROPOFOL: SHX5813

## 2021-10-19 LAB — CBC
HCT: 46.1 % (ref 39.0–52.0)
Hemoglobin: 14.5 g/dL (ref 13.0–17.0)
MCH: 29.1 pg (ref 26.0–34.0)
MCHC: 31.5 g/dL (ref 30.0–36.0)
MCV: 92.4 fL (ref 80.0–100.0)
Platelets: 316 10*3/uL (ref 150–400)
RBC: 4.99 MIL/uL (ref 4.22–5.81)
RDW: 13.2 % (ref 11.5–15.5)
WBC: 7.3 10*3/uL (ref 4.0–10.5)
nRBC: 0 % (ref 0.0–0.2)

## 2021-10-19 LAB — BASIC METABOLIC PANEL
Anion gap: 6 (ref 5–15)
BUN: 7 mg/dL (ref 6–20)
CO2: 24 mmol/L (ref 22–32)
Calcium: 8.7 mg/dL — ABNORMAL LOW (ref 8.9–10.3)
Chloride: 107 mmol/L (ref 98–111)
Creatinine, Ser: 0.94 mg/dL (ref 0.61–1.24)
GFR, Estimated: >60 mL/min (ref >60)
Glucose, Bld: 107 mg/dL — ABNORMAL HIGH (ref 70–99)
Potassium: 4 mmol/L (ref 3.5–5.1)
Sodium: 137 mmol/L (ref 135–145)

## 2021-10-19 LAB — HEMOGLOBIN A1C
Hgb A1c MFr Bld: 5.6 % (ref 4.8–5.6)
Mean Plasma Glucose: 114.02 mg/dL

## 2021-10-19 LAB — HIV ANTIBODY (ROUTINE TESTING W REFLEX): HIV Screen 4th Generation wRfx: NONREACTIVE

## 2021-10-19 SURGERY — ESOPHAGOGASTRODUODENOSCOPY (EGD) WITH PROPOFOL
Anesthesia: General

## 2021-10-19 MED ORDER — THIAMINE HCL 100 MG/ML IJ SOLN
100.0000 mg | Freq: Every day | INTRAMUSCULAR | Status: DC
  Administered 2021-10-19 – 2021-10-20 (×2): 100 mg via INTRAVENOUS
  Filled 2021-10-19 (×2): qty 2

## 2021-10-19 MED ORDER — LORAZEPAM 1 MG PO TABS: 1.0000 mg | ORAL_TABLET | ORAL | Status: DC | PRN

## 2021-10-19 MED ORDER — MIDAZOLAM HCL 2 MG/2ML IJ SOLN
INTRAMUSCULAR | Status: AC
  Filled 2021-10-19: qty 2

## 2021-10-19 MED ORDER — LIDOCAINE HCL (PF) 2 % IJ SOLN
INTRAMUSCULAR | Status: AC
  Filled 2021-10-19: qty 5

## 2021-10-19 MED ORDER — FENTANYL CITRATE (PF) 100 MCG/2ML IJ SOLN
INTRAMUSCULAR | Status: DC | PRN
  Administered 2021-10-19: 50 ug via INTRAVENOUS

## 2021-10-19 MED ORDER — CHLORPROMAZINE HCL 10 MG PO TABS
10.0000 mg | ORAL_TABLET | Freq: Three times a day (TID) | ORAL | Status: DC | PRN
  Administered 2021-10-19 – 2021-10-20 (×3): 10 mg via ORAL
  Filled 2021-10-19 (×5): qty 1

## 2021-10-19 MED ORDER — LORAZEPAM 2 MG/ML IJ SOLN: 1.0000 mg | INTRAMUSCULAR | Status: DC | PRN

## 2021-10-19 MED ORDER — SODIUM CHLORIDE 0.9 % IV SOLN
12.5000 mg | Freq: Once | INTRAVENOUS | Status: AC
  Administered 2021-10-19: 12.5 mg via INTRAVENOUS
  Filled 2021-10-19: qty 0.5

## 2021-10-19 MED ORDER — MIDAZOLAM HCL 5 MG/5ML IJ SOLN
INTRAMUSCULAR | Status: DC | PRN
  Administered 2021-10-19: 2 mg via INTRAVENOUS

## 2021-10-19 MED ORDER — LACTATED RINGERS IV SOLN: INTRAVENOUS | Status: DC | PRN

## 2021-10-19 MED ORDER — FOLIC ACID 1 MG PO TABS
1.0000 mg | ORAL_TABLET | Freq: Every day | ORAL | Status: DC
  Administered 2021-10-20: 1 mg via ORAL
  Filled 2021-10-19 (×2): qty 1

## 2021-10-19 MED ORDER — PROPOFOL 500 MG/50ML IV EMUL
INTRAVENOUS | Status: DC | PRN
  Administered 2021-10-19: 200 ug/kg/min via INTRAVENOUS

## 2021-10-19 MED ORDER — FENTANYL CITRATE (PF) 100 MCG/2ML IJ SOLN
INTRAMUSCULAR | Status: AC
  Filled 2021-10-19: qty 2

## 2021-10-19 MED ORDER — THIAMINE HCL 100 MG PO TABS: 100.0000 mg | ORAL_TABLET | Freq: Every day | ORAL | Status: DC

## 2021-10-19 MED ORDER — POTASSIUM CHLORIDE IN NACL 40-0.9 MEQ/L-% IV SOLN
INTRAVENOUS | Status: AC
  Filled 2021-10-19: qty 1000

## 2021-10-19 MED ORDER — PROPOFOL 10 MG/ML IV BOLUS
INTRAVENOUS | Status: DC | PRN
  Administered 2021-10-19: 100 mg via INTRAVENOUS

## 2021-10-19 MED ORDER — SUCRALFATE 1 GM/10ML PO SUSP
1.0000 g | Freq: Three times a day (TID) | ORAL | Status: DC
  Administered 2021-10-19 – 2021-10-20 (×3): 1 g via ORAL
  Filled 2021-10-19 (×3): qty 10

## 2021-10-19 NOTE — Progress Notes (Addendum)
  Transition of Care [facility]) Screening Note   Patient Details  Name: Terrence Strickland Date of Birth: Mar 20, 1966   Transition of Care [facility]) CM/SW Contact:    Iona Beard, [place] Phone Number: 10/19/2021, 10:15 AM  TOC consulted for substance use counseling. CSW spoke with pt in room about interest in resources. Pt states that he is not interested.   Transition of Care Department [facility]) has reviewed patient and no TOC needs have been identified at this time. We will continue to monitor patient advancement through interdisciplinary progression rounds. If new patient transition needs arise, please place a TOC consult.

## 2021-10-19 NOTE — ED Notes (Signed)
GI PA at bedside. 

## 2021-10-19 NOTE — Op Note (Signed)
[facility] Patient Name: Terrence Strickland Procedure Date: 10/19/2021 4:25 PM MRN: [id number] Date of Birth: June 01, 1966 Attending MD: Norvel Richards , MD CSN: [id number] Age: 56 Admit Type: Inpatient Procedure:                Upper GI endoscopy Indications:              Hematemesis Providers:                Norvel Richards, MD, Caprice Kluver, Baidland Page Referring MD:              Medicines:                Propofol per Anesthesia Complications:            No immediate complications. Estimated Blood Loss:     Estimated blood loss: none. Procedure:                Pre-Anesthesia Assessment:                           - Prior to the procedure, a History and Physical                            was performed, and patient medications and                            allergies were reviewed. The patient's tolerance of                            previous anesthesia was also reviewed. The risks                            and benefits of the procedure and the sedation                            options and risks were discussed with the patient.                            All questions were answered, and informed consent                            was obtained. Prior Anticoagulants: The patient has                            taken no previous anticoagulant or antiplatelet                            agents. ASA Grade Assessment: III - A patient with                            severe systemic disease. After reviewing the risks                            and benefits, the patient was deemed in  satisfactory condition to undergo the procedure.                           After obtaining informed consent, the endoscope was                            passed under direct vision. Throughout the                            procedure, the patient's blood pressure, pulse, and                            oxygen saturations were monitored continuously. The                             [id number] scope was introduced through the                            mouth, and advanced to the second part of duodenum.                            The upper GI endoscopy was accomplished without                            difficulty. The patient tolerated the procedure                            well. Scope In: 4:50:38 PM Scope Out: 4:54:13 PM Total Procedure Duration: 0 hours 3 minutes 35 seconds  Findings:      Severe longitudinal erosion/ulceration circumferentially coming up       halfway up the tubular esophagus. No varices. Markedly inflamed       esophageal mucosa. Patulous EG junction. Small hiatal hernia; otherwise,       normal-appearing gastric mucosa patent pylorus. Normal first and second       portion of the duodenum. Impression:               - Severe ulcerative reflux esophagitis?"likely cause                            of GI bleed (and hiccups as well). Small hiatal                            hernia. Patulous EG junction. Moderate Sedation:      Moderate (conscious) sedation was personally administered by an       anesthesia professional. The following parameters were monitored: oxygen       saturation, heart rate, blood pressure, respiratory rate, EKG, adequacy       of pulmonary ventilation, and response to care. Recommendation:           - Return patient to hospital ward for ongoing care.                           - Clear liquid diet. Continue twice daily Po  suspension 4 times daily. I would anticipate                            hiccups will settle down in the next couple of                            days. Obviously, cessation of EtOH abuse and daily                            NSAIDs will be very helpful                           - Procedure Code(s):        --- Professional ---                           [phone], Esophagogastroduodenoscopy, flexible,                            transoral; diagnostic, including collection of                             specimen(s) by brushing or washing, when performed                            (separate procedure) Diagnosis Code(s):        --- Professional ---                           K92.0, Hematemesis CPT copyright 2019 American Medical Association. All rights reserved. The codes documented in this report are preliminary and upon coder review may  be revised to meet current compliance requirements. Cristopher Estimable. Amanada Philbrick, MD Norvel Richards, MD 10/19/2021 5:42:10 PM This report has been signed electronically. Number of Addenda: 0

## 2021-10-19 NOTE — Anesthesia Postprocedure Evaluation (Signed)
Anesthesia Post Note  Patient: Terrence Strickland  Procedure(s) Performed: ESOPHAGOGASTRODUODENOSCOPY (EGD) WITH PROPOFOL  Patient location during evaluation: PACU Anesthesia Type: General Level of consciousness: awake and alert and oriented Pain management: pain level controlled Vital Signs Assessment: post-procedure vital signs reviewed and stable Respiratory status: spontaneous breathing, nonlabored ventilation and respiratory function stable Cardiovascular status: blood pressure returned to baseline and stable Postop Assessment: no apparent nausea or vomiting Anesthetic complications: no   No notable events documented.   Last Vitals:  Vitals:   10/19/21 1707 10/19/21 1715  BP: 124/83 122/87  Pulse: 84 82  Resp: 18 15  Temp:    SpO2: 100% 98%    Last Pain:  Vitals:   10/19/21 1700  TempSrc:   PainSc: Asleep                 Voula Waln C Hulen Mandler

## 2021-10-19 NOTE — Consult Note (Signed)
Gastroenterology Consult   Referring Provider: No ref. provider found Primary Care Physician:  Benetta Spar, MD Primary Gastroenterologist:  Dr. Jena Gauss (previously unassigned  Patient ID: JAZPER NIKOLAI; [id number]; June 08, 1965   Admit date: 10/18/2021  LOS: 0 days   Date of Consultation: 10/19/2021  Reason for Consultation:  hematemesis, melena, alcohol abuse  History of Present Illness   Terrence Strickland is a 56 y.o. year old male with history of depression, ADHD, and alcohol abuse. He was recently in the ED 5/10 with reports of vomiting with alcohol level 160, diagnosed with alcoholic drinks Tritus and discharged on Pepcid.  He presented to the ED with 1 week of vomiting with prior hematemesis and reports of melena that started 10 days ago after [place] [facility] jail.  GI consulted for hematemesis and melena and possible GI bleed.   ED course: Vital signs stable Hgb 14.3, potassium 3.2, ethanol level 51, lipase 21, normal LFTs and renal function CT A/P with contrast -hepatic steatosis, right inguinal hernia, trace right pleural effusion, liver and gallbladder unremarkable, no biliary ductal dilation, normal pancreas and spleen. Started on IV Protonix, IV fluid bolus given. Hemoccult positive  No prior endoscopies on file.  Consult: Hiccups and vomiting 1 week ago. No nausea. Some epigastric/LUQ pain, dull achey that his been going on for about a week as well. Pain is about the same whether or not he eats and the type of food does not make a difference. Black stools about 7 days ago as well. No throat burning, denies early satiety and always has a lack of appetite. No belching or coughing. Never had reflux before. Nothing over the counter other than rolaids that he thought may help with his vomiting. Smokes about 1/2 pack per day. Drinks about 4-5 12 oz cans a day. Had 3-4 liquor drinks as well about a week ago. Last alcoholic drink was yesterday. Was taking Aleve once a  day (1 tablet) for various pains. Denies illicit drug use.   Compazine not helping his hiccups. He admits to diarrhea recently with his black stools (mushy in nature). Denies constipation or BRBPR. Denies family history of PUD or colon cancer.     Past Medical History:  Diagnosis Date   ADHD (attention deficit hyperactivity disorder)    Alcohol abuse    Depression    Irregular heart beat     No past surgical history on file.  Prior to Admission medications   Medication Sig Start Date End Date Taking? Authorizing Provider  famotidine (PEPCID) 20 MG tablet Take 1 tablet (20 mg total) by mouth 2 (two) times daily. Patient not taking: Reported on 10/18/2021 10/13/21   Vanetta Mulders, MD  lisinopril (ZESTRIL) 20 MG tablet Take 1 tablet (20 mg total) by mouth daily. Patient not taking: Reported on 10/18/2021 10/13/20   Bethann Berkshire, MD  pantoprazole (PROTONIX) 20 MG tablet Take 1 tablet (20 mg total) by mouth daily. Patient not taking: Reported on 10/18/2021 10/13/20   Bethann Berkshire, MD    Current Facility-Administered Medications  Medication Dose Route Frequency Provider Last Rate Last Admin   0.9 % NaCl with KCl 40 mEq / L  infusion   Intravenous Continuous Emokpae, Ejiroghene E, MD   Stopped at 10/19/21 0755   acetaminophen (TYLENOL) tablet 650 mg  650 mg Oral Q6H PRN Emokpae, Ejiroghene E, MD       Or   acetaminophen (TYLENOL) suppository 650 mg  650 mg Rectal Q6H PRN Emokpae, Ejiroghene  E, MD       chlorproMAZINE (THORAZINE) tablet 10 mg  10 mg Oral TID PRN Adefeso, Oladapo, DO       pantoprazole (PROTONIX) injection 40 mg  40 mg Intravenous Q12H Emokpae, Ejiroghene E, MD   40 mg at 10/19/21 0116   polyethylene glycol (MIRALAX / GLYCOLAX) packet 17 g  17 g Oral Daily PRN Emokpae, Ejiroghene E, MD       promethazine (PHENERGAN) tablet 12.5 mg  12.5 mg Oral Q6H PRN Emokpae, Ejiroghene E, MD       thiamine tablet 100 mg  100 mg Oral Daily Zannie Cove, MD       Current Outpatient  Medications  Medication Sig Dispense Refill   famotidine (PEPCID) 20 MG tablet Take 1 tablet (20 mg total) by mouth 2 (two) times daily. (Patient not taking: Reported on 10/18/2021) 14 tablet 1   lisinopril (ZESTRIL) 20 MG tablet Take 1 tablet (20 mg total) by mouth daily. (Patient not taking: Reported on 10/18/2021) 30 tablet 0   pantoprazole (PROTONIX) 20 MG tablet Take 1 tablet (20 mg total) by mouth daily. (Patient not taking: Reported on 10/18/2021) 30 tablet 0    Allergies as of 10/18/2021 - Review Complete 10/18/2021  Allergen Reaction Noted   Nsaids Other (See Comments) 05/17/2013   Tramadol Other (See Comments) 03/26/2013    Family History  Problem Relation Age of Onset   Cancer Other    Diabetes Other     Social History   Socioeconomic History   Marital status: Married    Spouse name: Not on file   Number of children: Not on file   Years of education: Not on file   Highest education level: Not on file  Occupational History   Not on file  Tobacco Use   Smoking status: Every Day    Packs/day: 0.50    Years: 15.00    Pack years: 7.50    Types: Cigarettes   Smokeless tobacco: Never  Substance and Sexual Activity   Alcohol use: Yes    Comment: 6pack beer a day   Drug use: No   Sexual activity: Never  Other Topics Concern   Not on file  Social History Narrative   Not on file   Social Determinants of Health   Financial Resource Strain: Not on file  Food Insecurity: Not on file  Transportation Needs: Not on file  Physical Activity: Not on file  Stress: Not on file  Social Connections: Not on file  Intimate Partner Violence: Not on file     Review of Systems   Gen: Denies any fever, chills, loss of appetite, change in weight or weight loss CV: Denies chest pain, heart palpitations, syncope, edema  Resp: Denies shortness of breath with rest, cough, wheezing, coughing up blood, and pleurisy. GI: Denies vomiting blood, jaundice, and fecal incontinence.    Denies dysphagia or odynophagia. GU : Denies urinary burning, blood in urine, urinary frequency, and urinary incontinence. MS: Denies joint pain, limitation of movement, swelling, cramps, and atrophy.  Derm: Denies rash, itching, dry skin, hives. Psych: Denies depression, anxiety, memory loss, hallucinations, and confusion. Heme: Denies bruising or bleeding Neuro:  Denies any headaches, dizziness, paresthesias, shaking  Physical Exam   Vital Signs in last 24 hours: Temp:  [98.2 F (36.8 C)-98.8 F (37.1 C)] 98.7 F (37.1 C) (05/17 2122) Pulse Rate:  [57-100] 57 (05/18 0800) Resp:  [11-20] 16 (05/18 0700) BP: (131-162)/(85-106) 132/85 (05/18 0800) SpO2:  [95 %-100 %]  98 % (05/18 0800) Weight:  [68 kg] 68 kg (05/17 0905)    General:   Alert,  Well-developed, well-nourished, pleasant and cooperative in NAD Head:  Normocephalic and atraumatic. Eyes:  Sclera clear, no icterus.   Conjunctiva pink. Ears:  Normal auditory acuity. Lungs:  Clear throughout to auscultation.   No wheezes, crackles, or rhonchi. No acute distress. Heart:  Regular rate and rhythm; no murmurs, clicks, rubs,  or gallops. Abdomen:  Soft, nontender and nondistended. No masses, hepatosplenomegaly or hernias noted. Normal bowel sounds, without guarding, and without rebound.   Rectal:  deferred Msk:  Symmetrical without gross deformities. Normal posture. Extremities:  Without clubbing or edema. Neurologic:  Alert and oriented x4. Skin:  Intact without significant lesions or rashes. Psych:  Alert and cooperative. Normal mood and affect.  Intake/Output from previous day: 05/17 0701 - 05/18 0700 In: 1100 [IV Piggyback:1100] Out: -  Intake/Output this shift: No intake/output data recorded.   Labs/Studies   Recent Labs Recent Labs    10/18/21 0940 10/18/21 2008 10/19/21 0356  WBC 6.8  --  7.3  HGB 14.5 14.3 14.5  HCT 44.7 43.9 46.1  PLT 335  --  316   BMET Recent Labs    10/18/21 0940 10/19/21 0356   NA 137 137  K 3.2* 4.0  CL 96* 107  CO2 27 24  GLUCOSE 116* 107*  BUN 10 7  CREATININE 0.88 0.94  CALCIUM 9.1 8.7*   LFT Recent Labs    10/18/21 0940  PROT 7.7  ALBUMIN 4.0  AST 32  ALT 22  ALKPHOS 68  BILITOT 0.6   PT/INR No results for input(s): LABPROT, INR in the last 72 hours. Hepatitis Panel No results for input(s): HEPBSAG, HCVAB, HEPAIGM, HEPBIGM in the last 72 hours. C-Diff No results for input(s): CDIFFTOX in the last 72 hours.  Radiology/Studies CT ABDOMEN PELVIS W CONTRAST  Result Date: 10/18/2021 CLINICAL DATA:  Abdominal pain, nausea, vomiting, hiccups and hematemesis. EXAM: CT ABDOMEN AND PELVIS WITH CONTRAST TECHNIQUE: Multidetector CT imaging of the abdomen and pelvis was performed using the standard protocol following bolus administration of intravenous contrast. RADIATION DOSE REDUCTION: This exam was performed according to the departmental dose-optimization program which includes automated exposure control, adjustment of the mA and/or kV according to patient size and/or use of iterative reconstruction technique. CONTRAST:  100mL OMNIPAQUE IOHEXOL 300 MG/ML  SOLN COMPARISON:  None. FINDINGS: Lower chest: Minimal dependent atelectasis bilaterally. Heart is enlarged. No pericardial effusion. Trace right pleural fluid. Distal esophageal wall thickening can be seen with gastroesophageal reflux. Small hiatal hernia. Hepatobiliary: Liver is slightly decreased in attenuation diffusely. Liver and gallbladder are otherwise unremarkable. No biliary ductal dilatation. Pancreas: Negative. Spleen: Negative. Adrenals/Urinary Tract: Adrenal glands are unremarkable. 2.0 cm low-attenuation lesion in the upper pole right kidney is indicative of a cyst. Apparent soft tissue density along the ventral margin is felt to be due to volume averaging with renal parenchyma. Additional low-attenuation lesions in the kidneys are too small to characterize but statistically, cysts are likely. No  follow-up of these lesions is necessary. Ureters are decompressed. Bladder is grossly unremarkable. Stomach/Bowel: Small hiatal hernia. Stomach, small bowel, appendix and colon are otherwise unremarkable. Vascular/Lymphatic: Atherosclerotic calcification of the aorta. No pathologically enlarged lymph nodes. Reproductive: Prostate is visualized. Other: Right inguinal hernia contains a knuckle of colon as well as fat. Mesenteries and peritoneum are otherwise unremarkable. Musculoskeletal: Degenerative changes in the spine and hips. No worrisome lytic or sclerotic lesions. IMPRESSION: 1. No findings  to explain the patient's clinical history. 2. Trace right pleural effusion. 3. Hepatic steatosis. 4. Right inguinal hernia contains a knuckle of colon as well as fat. 5.  Aortic atherosclerosis (ICD10-I70.0). Electronically Signed   By: Leanna Battles M.D.   On: 10/18/2021 15:33     Assessment   FINNIS COLEE is a 56 y.o. year old male history of depression, ADHD, and alcohol abuse who presented to the ED with 10 days of melena and about 1 week of vomiting with intermittent hematemesis for which GI was consulted for further evaluation.  Hematemesis/melena: Patient reports hematemesis and melena for about one week now associated with epigastric and LUQ pain. He admits to chronic alcohol and tobacco use. He denies illicit drug use. He has on average 4-5 12 oz cans of beer a day and about one week ago he reportedly had 3-4 liquor drinks and none since. Last beer was yesterday. He admits to daily NSAID use and smokes about 1/2 pack per day. Reports that initially vomiting was yellow and then intermittently has had brown emesis. Reports last episode of vomit was this morning not long after taking tylenol (pill not observed in vomit). Denies weight loss or early satiety however reports a history of lack of appetite.  Hgb and labs have been stable. Some mild tenderness on exam to epigastrium and LUQ. Discussed performing  EGD to evaluate for alcoholic gastritis vs PUD vs possible Mallory Weise tear. We discussed importance of abstaining from alcohol and avoiding any NSAID products.  Continue IV PPI BID  I have discussed the risks, alternatives, benefits with regards to but not limited to the risk of reaction to medication, bleeding, infection, perforation and the patient is agreeable to proceed. Written consent to be obtained. Will proceed with EGD with propofol with Dr. Jena Gauss today.   Hepatic steatosis: Noted on CT A/P. Likely due to lifestyle and chronic alcohol use. Should abstain from all alcohol. Discussed potential complications from chronic alcohol abuse.    Plan / Recommendations   PPI IV BID Avoid NSAID Avoid all EtOH and work toward tobacco cessation Continue to monitor CBC and signs of further bleeding EGD with propofol with Dr. Jena Gauss today. Consider first screening colonoscopy on outpatient basis Further recommendations to follow.     10/19/2021, 8:37 AM  Brooke Bonito, MSN, FNP-BC, AGACNP-BC [facility]

## 2021-10-19 NOTE — Progress Notes (Signed)
PROGRESS NOTE    Terrence AnchorsJames J Strickland  ZOX:[id number] DOB: 09/27/1965 DOA: 10/18/2021 PCP: Benetta SparFanta, Tesfaye Demissie, MD  55/M with history of alcohol abuse, depression presented to the ED with hiccups, history of vomiting with hematemesis that started about a week ago and has since improved, also reports epigastric and upper chest discomfort.  He was released from prison 10 days prior, has been drinking alcohol almost every day since then, and also takes Aleve few times a week. -In the ED vitals were stable, hemoglobin was 14.5, CT abdomen pelvis noted hepatic steatosis   Subjective: -Complains of hiccups, mild abdominal discomfort  Assessment and Plan:   Hematemesis -History of hematemesis and melena approximately a week ago, suspect alcoholic gastritis, Mallory-Weiss tear versus PUD  -CT abdomen pelvis unremarkable for acute findings -Hemoglobin is normal, continue IV PPI today, GI planning endoscopy  -Counseled regarding EtOH cessation  Hypokalemia -Replaced, mag is normal  Alcohol abuse - blood alcohol level of 51.  On average he drinks about 4 beers daily. -CIWA protocol as needed -Continue thiamine, counseled  Chest pain Chest pain with upper epigastric pain in the setting of vomiting melena hematemesis.  Likely GI related.   EKG unremarkable, suggest LVH. -Troponin is negative, continue PPI  Mild hyperglycemia -Check HbA1c  DVT prophylaxis: SCDs Code Status: Full code Family Communication: Discussed patient in detail, no family at bedside Disposition Plan: Home pending GI eval  Consultants:  Gastroenterology  Procedures:   Antimicrobials:    Objective: Vitals:   10/19/21 0300 10/19/21 0330 10/19/21 0700 10/19/21 0800  BP: (!) 148/99 (!) 154/89 (!) 155/99 132/85  Pulse: (!) 57 (!) 59 (!) 58 (!) 57  Resp: 11 14 16    Temp:      TempSrc:      SpO2: 97% 96% 100% 98%  Weight:      Height:        Intake/Output Summary (Last 24 hours) at 10/19/2021 1128 Last data  filed at 10/18/2021 1516 Gross per 24 hour  Intake 1100 ml  Output --  Net 1100 ml   Filed Weights   10/18/21 0905  Weight: 68 kg    Examination:  General exam: Young male, laying in bed, AAOx3, no distress Respiratory system: Clear to auscultation Cardiovascular system: S1 & S2 heard, RRR.  Abd: nondistended, soft and nontender.Normal bowel sounds heard. Central nervous system: Alert and oriented. No focal neurological deficits. Extremities: no edema Skin: No rashes Psychiatry:  Mood & affect appropriate.     Data Reviewed:   CBC: Recent Labs  Lab 10/13/21 0457 10/18/21 0940 10/18/21 2008 10/19/21 0356  WBC 7.2 6.8  --  7.3  HGB 14.6 14.5 14.3 14.5  HCT 44.2 44.7 43.9 46.1  MCV 90.6 90.7  --  92.4  PLT 331 335  --  316   Basic Metabolic Panel: Recent Labs  Lab 10/13/21 0457 10/18/21 0940 10/18/21 2008 10/19/21 0356  NA 140 137  --  137  K 3.5 3.2*  --  4.0  CL 104 96*  --  107  CO2 25 27  --  24  GLUCOSE 102* 116*  --  107*  BUN 12 10  --  7  CREATININE 0.86 0.88  --  0.94  CALCIUM 8.9 9.1  --  8.7*  MG  --   --  2.1  --   PHOS  --   --  2.9  --    GFR: Estimated Creatinine Clearance: 85.4 mL/min (by C-G formula based on SCr  of 0.94 mg/dL). Liver Function Tests: Recent Labs  Lab 10/13/21 0457 10/18/21 0940  AST 45* 32  ALT 29 22  ALKPHOS 78 68  BILITOT 0.9 0.6  PROT 8.1 7.7  ALBUMIN 4.1 4.0   Recent Labs  Lab 10/13/21 0457 10/18/21 0940  LIPASE 22 21   No results for input(s): AMMONIA in the last 168 hours. Coagulation Profile: No results for input(s): INR, PROTIME in the last 168 hours. Cardiac Enzymes: No results for input(s): CKTOTAL, CKMB, CKMBINDEX, TROPONINI in the last 168 hours. BNP (last 3 results) No results for input(s): PROBNP in the last 8760 hours. HbA1C: No results for input(s): HGBA1C in the last 72 hours. CBG: No results for input(s): GLUCAP in the last 168 hours. Lipid Profile: No results for input(s): CHOL,  HDL, LDLCALC, TRIG, CHOLHDL, LDLDIRECT in the last 72 hours. Thyroid Function Tests: No results for input(s): TSH, T4TOTAL, FREET4, T3FREE, THYROIDAB in the last 72 hours. Anemia Panel: No results for input(s): VITAMINB12, FOLATE, FERRITIN, TIBC, IRON, RETICCTPCT in the last 72 hours. Urine analysis:    Component Value Date/Time   COLORURINE YELLOW 10/18/2021 0907   APPEARANCEUR CLEAR 10/18/2021 0907   LABSPEC 1.008 10/18/2021 0907   PHURINE 6.0 10/18/2021 0907   GLUCOSEU NEGATIVE 10/18/2021 0907   HGBUR SMALL (A) 10/18/2021 0907   BILIRUBINUR NEGATIVE 10/18/2021 0907   KETONESUR NEGATIVE 10/18/2021 0907   PROTEINUR NEGATIVE 10/18/2021 0907   NITRITE NEGATIVE 10/18/2021 0907   LEUKOCYTESUR NEGATIVE 10/18/2021 0907   Sepsis Labs: @LABRCNTIP (procalcitonin:4,lacticidven:4)  )No results found for this or any previous visit (from the past 240 hour(s)).   Radiology Studies: CT ABDOMEN PELVIS W CONTRAST  Result Date: 10/18/2021 CLINICAL DATA:  Abdominal pain, nausea, vomiting, hiccups and hematemesis. EXAM: CT ABDOMEN AND PELVIS WITH CONTRAST TECHNIQUE: Multidetector CT imaging of the abdomen and pelvis was performed using the standard protocol following bolus administration of intravenous contrast. RADIATION DOSE REDUCTION: This exam was performed according to the departmental dose-optimization program which includes automated exposure control, adjustment of the mA and/or kV according to patient size and/or use of iterative reconstruction technique. CONTRAST:  10/20/2021 OMNIPAQUE IOHEXOL 300 MG/ML  SOLN COMPARISON:  None. FINDINGS: Lower chest: Minimal dependent atelectasis bilaterally. Heart is enlarged. No pericardial effusion. Trace right pleural fluid. Distal esophageal wall thickening can be seen with gastroesophageal reflux. Small hiatal hernia. Hepatobiliary: Liver is slightly decreased in attenuation diffusely. Liver and gallbladder are otherwise unremarkable. No biliary ductal dilatation.  Pancreas: Negative. Spleen: Negative. Adrenals/Urinary Tract: Adrenal glands are unremarkable. 2.0 cm low-attenuation lesion in the upper pole right kidney is indicative of a cyst. Apparent soft tissue density along the ventral margin is felt to be due to volume averaging with renal parenchyma. Additional low-attenuation lesions in the kidneys are too small to characterize but statistically, cysts are likely. No follow-up of these lesions is necessary. Ureters are decompressed. Bladder is grossly unremarkable. Stomach/Bowel: Small hiatal hernia. Stomach, small bowel, appendix and colon are otherwise unremarkable. Vascular/Lymphatic: Atherosclerotic calcification of the aorta. No pathologically enlarged lymph nodes. Reproductive: Prostate is visualized. Other: Right inguinal hernia contains a knuckle of colon as well as fat. Mesenteries and peritoneum are otherwise unremarkable. Musculoskeletal: Degenerative changes in the spine and hips. No worrisome lytic or sclerotic lesions. IMPRESSION: 1. No findings to explain the patient's clinical history. 2. Trace right pleural effusion. 3. Hepatic steatosis. 4. Right inguinal hernia contains a knuckle of colon as well as fat. 5.  Aortic atherosclerosis (ICD10-I70.0). Electronically Signed   By:  M.D.   On: 10/18/2021 15:33     Scheduled Meds:  pantoprazole  40 mg Intravenous Q12H   thiamine injection  100 mg Intravenous Daily   Continuous Infusions:  0.9 % NaCl with KCl 40 mEq / L 75 mL/hr at 10/19/21 0912     LOS: 0 days    Time spent:  Zannie Cove, MD [facility]   10/19/2021, 11:28 AM

## 2021-10-19 NOTE — ED Notes (Signed)
Messaged attending re: plan of care

## 2021-10-19 NOTE — Anesthesia Preprocedure Evaluation (Signed)
Anesthesia Evaluation  Patient identified by MRN, date of birth, ID band Patient awake    Reviewed: Allergy & Precautions, NPO status , Patient's Chart, lab work & pertinent test results  Airway Mallampati: II  TM Distance: >3 FB Neck ROM: Full    Dental  (+) Edentulous Upper, Edentulous Lower   Pulmonary Current Smoker and Patient abstained from smoking.,    Pulmonary exam normal breath sounds clear to auscultation       Cardiovascular negative cardio ROS Normal cardiovascular exam Rhythm:Regular Rate:Normal  18-Oct-2021 14:36:14 [facility] ROUTINE RECORD [birth year] (55 yr) Male Black Vent. rate 71 BPM PR interval 156 ms QRS duration 94 ms QT/QTcB 451/491 ms P-R-T axes 77 51 61 Sinus rhythm Left ventricular hypertrophy ST elevation, consider anterolateral injury Borderline prolonged QT interval   Neuro/Psych PSYCHIATRIC DISORDERS Depression negative neurological ROS     GI/Hepatic negative GI ROS, (+)     substance abuse  alcohol use and marijuana use,   Endo/Other  negative endocrine ROS  Renal/GU negative Renal ROS  negative genitourinary   Musculoskeletal  (+) Arthritis , Osteoarthritis,    Abdominal   Peds negative pediatric ROS (+)  Hematology negative hematology ROS (+)   Anesthesia Other Findings   Reproductive/Obstetrics negative OB ROS                            Anesthesia Physical Anesthesia Plan  ASA: 3  Anesthesia Plan: General   Post-op Pain Management: Minimal or no pain anticipated   Induction: Intravenous  PONV Risk Score and Plan: Propofol infusion  Airway Management Planned: Nasal Cannula and Natural Airway  Additional Equipment:   Intra-op Plan:   Post-operative Plan:   Informed Consent: I have reviewed the patients History and Physical, chart, labs and discussed the procedure including the risks, benefits and alternatives  for the proposed anesthesia with the patient or authorized representative who has indicated his/her understanding and acceptance.     Dental advisory given  Plan Discussed with: CRNA and Surgeon  Anesthesia Plan Comments:         Anesthesia Quick Evaluation

## 2021-10-19 NOTE — Transfer of Care (Signed)
Immediate Anesthesia Transfer of Care Note  Patient: Terrence Strickland  Procedure(s) Performed: ESOPHAGOGASTRODUODENOSCOPY (EGD) WITH PROPOFOL  Patient Location: PACU  Anesthesia Type:General  Level of Consciousness: sedated and drowsy  Airway & Oxygen Therapy: Patient Spontanous Breathing and Patient connected to nasal cannula oxygen  Post-op Assessment: Post -op Vital signs reviewed and stable  Post vital signs: Reviewed and stable  Last Vitals:  Vitals Value Taken Time  BP 94/71 10/19/21 1700  Temp 97.4 10/19/21 1700  Pulse 79 10/19/21 1700  Resp 12 10/19/21 1700  SpO2 99 % 10/19/21 1700  Vitals shown include unvalidated device data.  Last Pain:  Vitals:   10/19/21 1644  TempSrc:   PainSc: 6       Patients Stated Pain Goal: 7 ([id number])  Complications: No notable events documented.

## 2021-10-20 ENCOUNTER — Telehealth: Payer: Self-pay | Admitting: Gastroenterology

## 2021-10-20 DIAGNOSIS — K920 Hematemesis: Secondary | ICD-10-CM | POA: Diagnosis not present

## 2021-10-20 LAB — COMPREHENSIVE METABOLIC PANEL
ALT: 17 U/L (ref 0–44)
AST: 24 U/L (ref 15–41)
Albumin: 3.4 g/dL — ABNORMAL LOW (ref 3.5–5.0)
Alkaline Phosphatase: 66 U/L (ref 38–126)
Anion gap: 6 (ref 5–15)
BUN: 6 mg/dL (ref 6–20)
CO2: 24 mmol/L (ref 22–32)
Calcium: 8.5 mg/dL — ABNORMAL LOW (ref 8.9–10.3)
Chloride: 106 mmol/L (ref 98–111)
Creatinine, Ser: 0.8 mg/dL (ref 0.61–1.24)
GFR, Estimated: >60 mL/min (ref >60)
Glucose, Bld: 102 mg/dL — ABNORMAL HIGH (ref 70–99)
Potassium: 3.8 mmol/L (ref 3.5–5.1)
Sodium: 136 mmol/L (ref 135–145)
Total Bilirubin: 0.6 mg/dL (ref 0.3–1.2)
Total Protein: 6.9 g/dL (ref 6.5–8.1)

## 2021-10-20 LAB — CBC
HCT: 43.9 % (ref 39.0–52.0)
Hemoglobin: 14.2 g/dL (ref 13.0–17.0)
MCH: 29.6 pg (ref 26.0–34.0)
MCHC: 32.3 g/dL (ref 30.0–36.0)
MCV: 91.6 fL (ref 80.0–100.0)
Platelets: 307 10*3/uL (ref 150–400)
RBC: 4.79 MIL/uL (ref 4.22–5.81)
RDW: 13.2 % (ref 11.5–15.5)
WBC: 6.5 10*3/uL (ref 4.0–10.5)
nRBC: 0 % (ref 0.0–0.2)

## 2021-10-20 MED ORDER — BACLOFEN 10 MG PO TABS: 10.0000 mg | ORAL_TABLET | Freq: Three times a day (TID) | ORAL | 0 refills | Status: AC | PRN

## 2021-10-20 MED ORDER — SUCRALFATE 1 GM/10ML PO SUSP: 1.0000 g | Freq: Three times a day (TID) | ORAL | 0 refills | Status: DC

## 2021-10-20 MED ORDER — PANTOPRAZOLE SODIUM 40 MG PO TBEC: 40.0000 mg | DELAYED_RELEASE_TABLET | Freq: Two times a day (BID) | ORAL | 0 refills | Status: DC

## 2021-10-20 MED ORDER — FOLIC ACID 1 MG PO TABS: 1.0000 mg | ORAL_TABLET | Freq: Every day | ORAL | 0 refills | Status: AC

## 2021-10-20 MED ORDER — PROMETHAZINE HCL 12.5 MG PO TABS: 12.5000 mg | ORAL_TABLET | Freq: Four times a day (QID) | ORAL | 0 refills | Status: DC | PRN

## 2021-10-20 NOTE — Progress Notes (Addendum)
Gastroenterology Progress Note   Referring Provider: No ref. provider found Primary Care Physician:  Carrolyn Meiers, MD Primary Gastroenterologist:  Dr. Gala Romney  Patient ID: Terrence Strickland; [id number]; Sep 30, 1965    Subjective   Patient had bene tolerating full liquids without issue. Denies any N/V, abdominal pain. States hiccups have improved. Reports no more dark stools and diarrhea has improved. No BM this morning.    Objective   Vital signs in last 24 hours Temp:  [97.4 F (36.3 C)-99.3 F (37.4 C)] 98.4 F (36.9 C) (05/19 0558) Pulse Rate:  [50-84] 77 (05/19 0558) Resp:  [12-18] 18 (05/19 0558) BP: (94-155)/(71-111) 141/103 (05/19 0558) SpO2:  [98 %-100 %] 99 % (05/19 0558)    Physical Exam General:   Alert and oriented, pleasant Head:  Normocephalic and atraumatic. Eyes:  No icterus, sclera clear. Conjuctiva pink.  Mouth:  Without lesions, mucosa pink and moist.  Neck:  Supple, without thyromegaly or masses.  Heart:  S1, S2 present, no murmurs noted.  Lungs: Clear to auscultation bilaterally, without wheezing, rales, or rhonchi.  Abdomen:  Bowel sounds present, soft, non-tender, non-distended. No HSM or hernias noted. No rebound or guarding. No masses appreciated  Msk:  Symmetrical without gross deformities. Normal posture. Pulses:  Normal pulses noted. Extremities:  Without clubbing or edema. Neurologic:  Alert and  oriented x4;  grossly normal neurologically. Skin:  Warm and dry, intact without significant lesions.  Cervical Nodes:  No significant cervical adenopathy. Psych:  Alert and cooperative. Normal mood and affect.  Intake/Output from previous day: 05/18 0701 - 05/19 0700 In: 1218.8 [P.O.:480; I.V.:738.8] Out: -  Intake/Output this shift: Total I/O In: 480 [P.O.:480] Out: -   Lab Results  Recent Labs    10/18/21 0940 10/18/21 2008 10/19/21 0356 10/20/21 0349  WBC 6.8  --  7.3 6.5  HGB 14.5 14.3 14.5 14.2  HCT 44.7 43.9 46.1 43.9   PLT 335  --  316 307   BMET Recent Labs    10/18/21 0940 10/19/21 0356 10/20/21 0349  NA 137 137 136  K 3.2* 4.0 3.8  CL 96* 107 106  CO2 27 24 24   GLUCOSE 116* 107* 102*  BUN 10 7 6   CREATININE 0.88 0.94 0.80  CALCIUM 9.1 8.7* 8.5*   LFT Recent Labs    10/18/21 0940 10/20/21 0349  PROT 7.7 6.9  ALBUMIN 4.0 3.4*  AST 32 24  ALT 22 17  ALKPHOS 68 66  BILITOT 0.6 0.6   PT/INR No results for input(s): LABPROT, INR in the last 72 hours. Hepatitis Panel No results for input(s): HEPBSAG, HCVAB, HEPAIGM, HEPBIGM in the last 72 hours.   Studies/Results CT ABDOMEN PELVIS W CONTRAST  Result Date: 10/18/2021 CLINICAL DATA:  Abdominal pain, nausea, vomiting, hiccups and hematemesis. EXAM: CT ABDOMEN AND PELVIS WITH CONTRAST TECHNIQUE: Multidetector CT imaging of the abdomen and pelvis was performed using the standard protocol following bolus administration of intravenous contrast. RADIATION DOSE REDUCTION: This exam was performed according to the departmental dose-optimization program which includes automated exposure control, adjustment of the mA and/or kV according to patient size and/or use of iterative reconstruction technique. CONTRAST:  186mL OMNIPAQUE IOHEXOL 300 MG/ML  SOLN COMPARISON:  None. FINDINGS: Lower chest: Minimal dependent atelectasis bilaterally. Heart is enlarged. No pericardial effusion. Trace right pleural fluid. Distal esophageal wall thickening can be seen with gastroesophageal reflux. Small hiatal hernia. Hepatobiliary: Liver is slightly decreased in attenuation diffusely. Liver and gallbladder are otherwise unremarkable. No biliary ductal dilatation.  Pancreas: Negative. Spleen: Negative. Adrenals/Urinary Tract: Adrenal glands are unremarkable. 2.0 cm low-attenuation lesion in the upper pole right kidney is indicative of a cyst. Apparent soft tissue density along the ventral margin is felt to be due to volume averaging with renal parenchyma. Additional  low-attenuation lesions in the kidneys are too small to characterize but statistically, cysts are likely. No follow-up of these lesions is necessary. Ureters are decompressed. Bladder is grossly unremarkable. Stomach/Bowel: Small hiatal hernia. Stomach, small bowel, appendix and colon are otherwise unremarkable. Vascular/Lymphatic: Atherosclerotic calcification of the aorta. No pathologically enlarged lymph nodes. Reproductive: Prostate is visualized. Other: Right inguinal hernia contains a knuckle of colon as well as fat. Mesenteries and peritoneum are otherwise unremarkable. Musculoskeletal: Degenerative changes in the spine and hips. No worrisome lytic or sclerotic lesions. IMPRESSION: 1. No findings to explain the patient's clinical history. 2. Trace right pleural effusion. 3. Hepatic steatosis. 4. Right inguinal hernia contains a knuckle of colon as well as fat. 5.  Aortic atherosclerosis (ICD10-I70.0). Electronically Signed   By: Lorin Picket M.D.   On: 10/18/2021 15:33   DG Chest Port 1 View  Result Date: 10/13/2021 CLINICAL DATA:  Chest pain EXAM: PORTABLE CHEST 1 VIEW COMPARISON:  06/23/2021 FINDINGS: The heart size and mediastinal contours are within normal limits. Both lungs are clear. No pleural effusion pneumothorax. The visualized skeletal structures are unremarkable. IMPRESSION: No acute process in the chest. Electronically Signed   By: Macy Mis M.D.   On: 10/13/2021 10:10    Assessment  56 y.o. male with a history of depression, ADHD, and alcohol abuse who presented to the ED with 10 days of melena and about 1 week of vomiting with intermittent hematemesis for which GI was consulted for further evaluation.   Hematemesis/melena: Patient reported hematemesis and melena for about a week with associated epigastric and LUQ pain. He admits to chronic alcohol and tobacco use but denies any other illicit drug use. He on average has 4-5 12 oz cans of beer a day and occasionally will also  have 3-4 liquor drinks. Last beer was the morning before he presented to the ED. Initially stated vomiting was yellow in nature and then began having intermittent brown emesis. No alarm symptoms present. Hgb stable at 14. He underwent EGD on 5/18 severe ulcerative reflux esophagitis, small hiatal hernia, patulous GE junction. He should continue PPI BID and carafate suspension 1g 4 times daily for at least 5 days. It is imperative that he avoids NSAIDs and alcohol which we discussed this morning and states he understands. He has tolerated full liquid diet and reports no more abdominal pain, vomiting, diarrhea and improvement in hiccups. He should follow up outpatient in about 4 weeks and consider first ever screening colonoscopy.   Hepatic Steatosis: Noted on CT A/P. Likely due to lifestyle and chronic alcohol use. Should abstain from all alcohol. Discussed potential complication from chronic alcohol use like progression to cirrhosis.   Plan / Recommendations  PPI BID Advance to soft diet Carafate 1g suspension 4 times daily for 5 days Avoid NSAID use Alcohol cessation and work toward tobacco cessation Consider colonoscopy outpatient Follow up outpatient in 4 weeks.     LOS: 0 days    10/20/2021, 11:48 AM   Venetia Night, MSN, FNP-BC, AGACNP-BC [facility]

## 2021-10-20 NOTE — Progress Notes (Signed)
Patient has had no new issues, no complaints of abdominal pain or nausea and vomiting.  Patient refused telemetry to be placed back on at this time.  Vitals stable. Family member at bedside.

## 2021-10-20 NOTE — Telephone Encounter (Signed)
Please arrange hospital follow up in about 4 weeks for reflux esophagitis.   Brooke Bonito, MSN, FNP-BC, AGACNP-BC [facility]

## 2021-10-20 NOTE — TOC Progression Note (Signed)
Transition of Care [facility]) - Progression Note    Patient Details  Name: Terrence Strickland MRN: [id number] Date of Birth: Nov 18, 1965  [facility] CM/SW Contact  Leitha Bleak, RN Phone Number: 10/20/2021, 1:29 PM  Clinical Narrative:   Patient admitted with hematemesis. Patient has a high risk for readmission. Patient lives at home with his wife, does not drive. He has a PCP for follow up. He walks without assistance of any aides.  Expected Discharge Plan: Home/Self Care Barriers to Discharge: Continued Medical Work up  Expected Discharge Plan and Services Expected Discharge Plan: Home/Self Care     Living arrangements for the past 2 months: Single Family Home                  Readmission Risk Interventions     View : No data to display.

## 2021-10-20 NOTE — Discharge Summary (Signed)
Physician Discharge Summary  Terrence Strickland [id number] DOB: Feb 25, 1966 DOA: 10/18/2021  PCP: Carrolyn Meiers, MD  Admit date: 10/18/2021  Discharge date: 10/20/2021  Admitted From:Home  Disposition:  Home  Recommendations for Outpatient Follow-up:  Follow up with PCP in 1-2 weeks Follow-up with GI in approximately 4 weeks with plans for repeat EGD at that time Continue PPI twice daily as well as Carafate for the next 4 weeks Patient counseled on alcohol cessation as well as avoidance of NSAIDs and BC powders Baclofen prescribed as needed for intractable hiccups  Home Health: None  Equipment/Devices: None  Discharge Condition:Stable  CODE STATUS: Full  Diet recommendation: Heart Healthy  Brief/Interim Summary: 55/M with history of alcohol abuse, depression presented to the ED with hiccups, history of vomiting with hematemesis that started about a week ago and has since improved, also reports epigastric and upper chest discomfort.  He was released from prison 10 days prior, has been drinking alcohol almost every day since then, and also takes Aleve few times a week. -In the ED vitals were stable, hemoglobin was 14.5, CT abdomen pelvis noted hepatic steatosis  -Patient was admitted for evaluation of hematemesis in the setting of alcohol use as well as NSAID use.  He has undergone EGD on 5/18 with findings of severe ulcerative reflux esophagitis.  He is currently tolerating full liquid diet but continues to have some mild hiccups and has been seen by GI with recommendations to continue medications as noted above and follow-up in approximately 4 weeks.  He is in stable condition for discharge with no other acute events or concerns noted during the stay.  Discharge Diagnoses:  Principal Problem:   Hematemesis Active Problems:   Chest pain   Alcohol abuse   Hypokalemia   Alcohol intoxication ([place])  Principal discharge diagnosis: Hematemesis and melena secondary to severe  ulcerative reflux esophagitis seen on EGD 5/18.  Alcohol abuse.  Discharge Instructions  Discharge Instructions     Diet - low sodium heart healthy   Complete by: As directed    Increase activity slowly   Complete by: As directed       Allergies as of 10/20/2021       Reactions   Nsaids Other (See Comments)   hallucinations   Tramadol Other (See Comments)   Chills, hallucinations, sweating        Medication List     STOP taking these medications    famotidine 20 MG tablet Commonly known as: PEPCID   lisinopril 20 MG tablet Commonly known as: ZESTRIL       TAKE these medications    baclofen 10 MG tablet Commonly known as: LIORESAL Take 1 tablet (10 mg total) by mouth 3 (three) times daily as needed (hiccoughs).   folic acid 1 MG tablet Commonly known as: FOLVITE Take 1 tablet (1 mg total) by mouth daily. Start taking on: Oct 21, 2021   pantoprazole 40 MG tablet Commonly known as: Protonix Take 1 tablet (40 mg total) by mouth 2 (two) times daily. What changed:  medication strength how much to take when to take this   promethazine 12.5 MG tablet Commonly known as: PHENERGAN Take 1 tablet (12.5 mg total) by mouth every 6 (six) hours as needed for nausea.   sucralfate 1 GM/10ML suspension Commonly known as: CARAFATE Take 10 mLs (1 g total) by mouth 4 (four) times daily -  with meals and at bedtime.        Follow-up Information  Carrolyn Meiers, MD. Schedule an appointment as soon as possible for a visit in 1 week(s).   Specialty: Internal Medicine Contact information: [place] [address] [phone]         [facility]. Go in 4 week(s).   Contact information: [place] [address] [phone]               Allergies  Allergen Reactions   Nsaids Other (See Comments)    hallucinations   Tramadol Other (See Comments)    Chills,  hallucinations, sweating      Consultations: GI   Procedures/Studies: CT ABDOMEN PELVIS W CONTRAST  Result Date: 10/18/2021 CLINICAL DATA:  Abdominal pain, nausea, vomiting, hiccups and hematemesis. EXAM: CT ABDOMEN AND PELVIS WITH CONTRAST TECHNIQUE: Multidetector CT imaging of the abdomen and pelvis was performed using the standard protocol following bolus administration of intravenous contrast. RADIATION DOSE REDUCTION: This exam was performed according to the departmental dose-optimization program which includes automated exposure control, adjustment of the mA and/or kV according to patient size and/or use of iterative reconstruction technique. CONTRAST:  15mL OMNIPAQUE IOHEXOL 300 MG/ML  SOLN COMPARISON:  None. FINDINGS: Lower chest: Minimal dependent atelectasis bilaterally. Heart is enlarged. No pericardial effusion. Trace right pleural fluid. Distal esophageal wall thickening can be seen with gastroesophageal reflux. Small hiatal hernia. Hepatobiliary: Liver is slightly decreased in attenuation diffusely. Liver and gallbladder are otherwise unremarkable. No biliary ductal dilatation. Pancreas: Negative. Spleen: Negative. Adrenals/Urinary Tract: Adrenal glands are unremarkable. 2.0 cm low-attenuation lesion in the upper pole right kidney is indicative of a cyst. Apparent soft tissue density along the ventral margin is felt to be due to volume averaging with renal parenchyma. Additional low-attenuation lesions in the kidneys are too small to characterize but statistically, cysts are likely. No follow-up of these lesions is necessary. Ureters are decompressed. Bladder is grossly unremarkable. Stomach/Bowel: Small hiatal hernia. Stomach, small bowel, appendix and colon are otherwise unremarkable. Vascular/Lymphatic: Atherosclerotic calcification of the aorta. No pathologically enlarged lymph nodes. Reproductive: Prostate is visualized. Other: Right inguinal hernia contains a knuckle of colon as well  as fat. Mesenteries and peritoneum are otherwise unremarkable. Musculoskeletal: Degenerative changes in the spine and hips. No worrisome lytic or sclerotic lesions. IMPRESSION: 1. No findings to explain the patient's clinical history. 2. Trace right pleural effusion. 3. Hepatic steatosis. 4. Right inguinal hernia contains a knuckle of colon as well as fat. 5.  Aortic atherosclerosis (ICD10-I70.0). Electronically Signed   By: Lorin Picket M.D.   On: 10/18/2021 15:33   DG Chest Port 1 View  Result Date: 10/13/2021 CLINICAL DATA:  Chest pain EXAM: PORTABLE CHEST 1 VIEW COMPARISON:  06/23/2021 FINDINGS: The heart size and mediastinal contours are within normal limits. Both lungs are clear. No pleural effusion pneumothorax. The visualized skeletal structures are unremarkable. IMPRESSION: No acute process in the chest. Electronically Signed   By: Macy Mis M.D.   On: 10/13/2021 10:10     Discharge Exam: Vitals:   10/19/21 2339 10/20/21 0558  BP: (!) 155/104 (!) 141/103  Pulse: 62 77  Resp: 18 18  Temp: 99.3 F (37.4 C) 98.4 F (36.9 C)  SpO2: 98% 99%   Vitals:   10/19/21 1715 10/19/21 1800 10/19/21 2339 10/20/21 0558  BP: 122/87 (!) 146/103 (!) 155/104 (!) 141/103  Pulse: 82 (!) 56 62 77  Resp: 15 18 18 18   Temp:  97.8 F (36.6 C) 99.3 F (37.4 C) 98.4 F (36.9 C)  TempSrc:  Oral Oral Oral  SpO2: 98% 99% 98% 99%  Weight:      Height:        General: Pt is alert, awake, not in acute distress Cardiovascular: RRR, S1/S2 +, no rubs, no gallops Respiratory: CTA bilaterally, no wheezing, no rhonchi Abdominal: Soft, NT, ND, bowel sounds + Extremities: no edema, no cyanosis    The results of significant diagnostics from this hospitalization (including imaging, microbiology, ancillary and laboratory) are listed below for reference.     Microbiology: No results found for this or any previous visit (from the past 240 hour(s)).   Labs: BNP (last 3 results) No results for  input(s): BNP in the last 8760 hours. Basic Metabolic Panel: Recent Labs  Lab 10/18/21 0940 10/18/21 2008 10/19/21 0356 10/20/21 0349  NA 137  --  137 136  K 3.2*  --  4.0 3.8  CL 96*  --  107 106  CO2 27  --  24 24  GLUCOSE 116*  --  107* 102*  BUN 10  --  7 6  CREATININE 0.88  --  0.94 0.80  CALCIUM 9.1  --  8.7* 8.5*  MG  --  2.1  --   --   PHOS  --  2.9  --   --    Liver Function Tests: Recent Labs  Lab 10/18/21 0940 10/20/21 0349  AST 32 24  ALT 22 17  ALKPHOS 68 66  BILITOT 0.6 0.6  PROT 7.7 6.9  ALBUMIN 4.0 3.4*   Recent Labs  Lab 10/18/21 0940  LIPASE 21   No results for input(s): AMMONIA in the last 168 hours. CBC: Recent Labs  Lab 10/18/21 0940 10/18/21 2008 10/19/21 0356 10/20/21 0349  WBC 6.8  --  7.3 6.5  HGB 14.5 14.3 14.5 14.2  HCT 44.7 43.9 46.1 43.9  MCV 90.7  --  92.4 91.6  PLT 335  --  316 307   Cardiac Enzymes: No results for input(s): CKTOTAL, CKMB, CKMBINDEX, TROPONINI in the last 168 hours. BNP: Invalid input(s): POCBNP CBG: No results for input(s): GLUCAP in the last 168 hours. D-Dimer No results for input(s): DDIMER in the last 72 hours. Hgb A1c Recent Labs    10/19/21 0356  HGBA1C 5.6   Lipid Profile No results for input(s): CHOL, HDL, LDLCALC, TRIG, CHOLHDL, LDLDIRECT in the last 72 hours. Thyroid function studies No results for input(s): TSH, T4TOTAL, T3FREE, THYROIDAB in the last 72 hours.  Invalid input(s): FREET3 Anemia work up No results for input(s): VITAMINB12, FOLATE, FERRITIN, TIBC, IRON, RETICCTPCT in the last 72 hours. Urinalysis    Component Value Date/Time   COLORURINE YELLOW 10/18/2021 0907   APPEARANCEUR CLEAR 10/18/2021 0907   LABSPEC 1.008 10/18/2021 0907   PHURINE 6.0 10/18/2021 0907   GLUCOSEU NEGATIVE 10/18/2021 0907   HGBUR SMALL (A) 10/18/2021 0907   BILIRUBINUR NEGATIVE 10/18/2021 0907   KETONESUR NEGATIVE 10/18/2021 0907   PROTEINUR NEGATIVE 10/18/2021 0907   NITRITE NEGATIVE  10/18/2021 0907   LEUKOCYTESUR NEGATIVE 10/18/2021 0907   Sepsis Labs Invalid input(s): PROCALCITONIN,  WBC,  LACTICIDVEN Microbiology No results found for this or any previous visit (from the past 240 hour(s)).   Time coordinating discharge: 35 minutes  SIGNED:   Rodena Goldmann, DO [facility] 10/20/2021, 2:49 PM  If 7PM-7AM, please contact night-coverage [email or web address]

## 2021-10-24 ENCOUNTER — Encounter: Payer: Self-pay | Admitting: Student

## 2021-10-25 ENCOUNTER — Other Ambulatory Visit: Payer: Self-pay

## 2021-10-25 ENCOUNTER — Inpatient Hospital Stay (HOSPITAL_COMMUNITY)
Admission: EM | Admit: 2021-10-25 | Discharge: 2021-10-27 | DRG: 683 | Disposition: A | Discharge status: Home / Self Care | Payer: Medicaid Other | Attending: Family Medicine | Admitting: Family Medicine

## 2021-10-25 ENCOUNTER — Encounter (HOSPITAL_COMMUNITY): Payer: Self-pay | Admitting: Emergency Medicine

## 2021-10-25 DIAGNOSIS — E861 Hypovolemia: Secondary | ICD-10-CM | POA: Diagnosis present

## 2021-10-25 DIAGNOSIS — F17210 Nicotine dependence, cigarettes, uncomplicated: Secondary | ICD-10-CM | POA: Diagnosis present

## 2021-10-25 DIAGNOSIS — E871 Hypo-osmolality and hyponatremia: Secondary | ICD-10-CM | POA: Diagnosis present

## 2021-10-25 DIAGNOSIS — Z716 Tobacco abuse counseling: Secondary | ICD-10-CM

## 2021-10-25 DIAGNOSIS — K529 Noninfective gastroenteritis and colitis, unspecified: Secondary | ICD-10-CM | POA: Diagnosis not present

## 2021-10-25 DIAGNOSIS — F10129 Alcohol abuse with intoxication, unspecified: Secondary | ICD-10-CM | POA: Diagnosis present

## 2021-10-25 DIAGNOSIS — A080 Rotaviral enteritis: Secondary | ICD-10-CM | POA: Diagnosis present

## 2021-10-25 DIAGNOSIS — Z888 Allergy status to other drugs, medicaments and biological substances status: Secondary | ICD-10-CM

## 2021-10-25 DIAGNOSIS — R030 Elevated blood-pressure reading, without diagnosis of hypertension: Secondary | ICD-10-CM

## 2021-10-25 DIAGNOSIS — K2100 Gastro-esophageal reflux disease with esophagitis, without bleeding: Secondary | ICD-10-CM | POA: Diagnosis present

## 2021-10-25 DIAGNOSIS — Z720 Tobacco use: Secondary | ICD-10-CM | POA: Diagnosis present

## 2021-10-25 DIAGNOSIS — N179 Acute kidney failure, unspecified: Principal | ICD-10-CM | POA: Diagnosis present

## 2021-10-25 DIAGNOSIS — D751 Secondary polycythemia: Secondary | ICD-10-CM | POA: Diagnosis present

## 2021-10-25 DIAGNOSIS — Z79899 Other long term (current) drug therapy: Secondary | ICD-10-CM

## 2021-10-25 DIAGNOSIS — Z833 Family history of diabetes mellitus: Secondary | ICD-10-CM | POA: Diagnosis not present

## 2021-10-25 DIAGNOSIS — F909 Attention-deficit hyperactivity disorder, unspecified type: Secondary | ICD-10-CM | POA: Diagnosis present

## 2021-10-25 DIAGNOSIS — E860 Dehydration: Secondary | ICD-10-CM | POA: Diagnosis present

## 2021-10-25 DIAGNOSIS — F32A Depression, unspecified: Secondary | ICD-10-CM | POA: Diagnosis present

## 2021-10-25 DIAGNOSIS — F1010 Alcohol abuse, uncomplicated: Secondary | ICD-10-CM | POA: Diagnosis present

## 2021-10-25 DIAGNOSIS — I10 Essential (primary) hypertension: Secondary | ICD-10-CM | POA: Diagnosis present

## 2021-10-25 DIAGNOSIS — Z885 Allergy status to narcotic agent status: Secondary | ICD-10-CM

## 2021-10-25 LAB — COMPREHENSIVE METABOLIC PANEL
ALT: 22 U/L (ref 0–44)
AST: 36 U/L (ref 15–41)
Albumin: 4.6 g/dL (ref 3.5–5.0)
Alkaline Phosphatase: 81 U/L (ref 38–126)
Anion gap: 10 (ref 5–15)
BUN: 54 mg/dL — ABNORMAL HIGH (ref 6–20)
CO2: 23 mmol/L (ref 22–32)
Calcium: 9.3 mg/dL (ref 8.9–10.3)
Chloride: 101 mmol/L (ref 98–111)
Creatinine, Ser: 2.8 mg/dL — ABNORMAL HIGH (ref 0.61–1.24)
GFR, Estimated: 26 mL/min — ABNORMAL LOW (ref >60)
Glucose, Bld: 116 mg/dL — ABNORMAL HIGH (ref 70–99)
Potassium: 4.3 mmol/L (ref 3.5–5.1)
Sodium: 134 mmol/L — ABNORMAL LOW (ref 135–145)
Total Bilirubin: 0.4 mg/dL (ref 0.3–1.2)
Total Protein: 9.3 g/dL — ABNORMAL HIGH (ref 6.5–8.1)

## 2021-10-25 LAB — URINALYSIS, ROUTINE W REFLEX MICROSCOPIC
Bilirubin Urine: NEGATIVE
Glucose, UA: NEGATIVE mg/dL
Hgb urine dipstick: NEGATIVE
Ketones, ur: NEGATIVE mg/dL
Leukocytes,Ua: NEGATIVE
Nitrite: NEGATIVE
Protein, ur: 100 mg/dL — AB
Specific Gravity, Urine: 1.021 (ref 1.005–1.030)
pH: 5 (ref 5.0–8.0)

## 2021-10-25 LAB — CBC
HCT: 55.5 % — ABNORMAL HIGH (ref 39.0–52.0)
Hemoglobin: 17.9 g/dL — ABNORMAL HIGH (ref 13.0–17.0)
MCH: 30 pg (ref 26.0–34.0)
MCHC: 32.3 g/dL (ref 30.0–36.0)
MCV: 93 fL (ref 80.0–100.0)
Platelets: 370 10*3/uL (ref 150–400)
RBC: 5.97 MIL/uL — ABNORMAL HIGH (ref 4.22–5.81)
RDW: 14 % (ref 11.5–15.5)
WBC: 5.3 10*3/uL (ref 4.0–10.5)
nRBC: 0 % (ref 0.0–0.2)

## 2021-10-25 LAB — LIPASE, BLOOD: Lipase: 31 U/L (ref 11–51)

## 2021-10-25 MED ORDER — PANTOPRAZOLE SODIUM 40 MG PO TBEC
40.0000 mg | DELAYED_RELEASE_TABLET | Freq: Two times a day (BID) | ORAL | Status: DC
  Administered 2021-10-26 – 2021-10-27 (×4): 40 mg via ORAL
  Filled 2021-10-25 (×4): qty 1

## 2021-10-25 MED ORDER — SODIUM CHLORIDE 0.9 % IV SOLN: INTRAVENOUS | Status: DC

## 2021-10-25 MED ORDER — ONDANSETRON HCL 4 MG/2ML IJ SOLN: 4.0000 mg | Freq: Four times a day (QID) | INTRAMUSCULAR | Status: DC | PRN

## 2021-10-25 MED ORDER — LACTATED RINGERS IV BOLUS
1000.0000 mL | Freq: Once | INTRAVENOUS | Status: AC
Start: 2021-10-25 — End: 2021-10-25
  Administered 2021-10-25: 1000 mL via INTRAVENOUS

## 2021-10-25 MED ORDER — MAGNESIUM HYDROXIDE 400 MG/5ML PO SUSP: 30.0000 mL | Freq: Every day | ORAL | Status: DC | PRN

## 2021-10-25 MED ORDER — ONDANSETRON HCL 4 MG PO TABS: 4.0000 mg | ORAL_TABLET | Freq: Four times a day (QID) | ORAL | Status: DC | PRN

## 2021-10-25 MED ORDER — TRAZODONE HCL 50 MG PO TABS
25.0000 mg | ORAL_TABLET | Freq: Every evening | ORAL | Status: DC | PRN
  Administered 2021-10-26: 25 mg via ORAL
  Filled 2021-10-25: qty 1

## 2021-10-25 MED ORDER — ENOXAPARIN SODIUM 30 MG/0.3ML IJ SOSY
30.0000 mg | PREFILLED_SYRINGE | INTRAMUSCULAR | Status: DC
  Filled 2021-10-25: qty 0.3

## 2021-10-25 MED ORDER — ACETAMINOPHEN 650 MG RE SUPP: 650.0000 mg | Freq: Four times a day (QID) | RECTAL | Status: DC | PRN

## 2021-10-25 MED ORDER — ONDANSETRON HCL 4 MG/2ML IJ SOLN
4.0000 mg | Freq: Once | INTRAMUSCULAR | Status: AC
  Administered 2021-10-25: 4 mg via INTRAVENOUS
  Filled 2021-10-25: qty 2

## 2021-10-25 MED ORDER — FOLIC ACID 1 MG PO TABS
1.0000 mg | ORAL_TABLET | Freq: Every day | ORAL | Status: DC
Start: 2021-10-26 — End: 2021-10-27
  Administered 2021-10-26 – 2021-10-27 (×2): 1 mg via ORAL
  Filled 2021-10-25 (×2): qty 1

## 2021-10-25 MED ORDER — SUCRALFATE 1 GM/10ML PO SUSP
1.0000 g | Freq: Three times a day (TID) | ORAL | Status: DC
  Administered 2021-10-26 – 2021-10-27 (×6): 1 g via ORAL
  Filled 2021-10-25 (×6): qty 10

## 2021-10-25 MED ORDER — ACETAMINOPHEN 325 MG PO TABS
650.0000 mg | ORAL_TABLET | Freq: Four times a day (QID) | ORAL | Status: DC | PRN
  Administered 2021-10-26 (×2): 650 mg via ORAL
  Filled 2021-10-25 (×2): qty 2

## 2021-10-25 NOTE — ED Notes (Signed)
Warm blanket provided.

## 2021-10-25 NOTE — H&P (Incomplete)
[place]   PATIENT NAME: Terrence Strickland    MR#:  [id number]  DATE OF BIRTH:  11-02-1965  DATE OF ADMISSION:  10/25/2021  PRIMARY CARE PHYSICIAN: Benetta Spar, MD   Patient is coming from: Home  REQUESTING/REFERRING PHYSICIAN: Dione Booze, MD  CHIEF COMPLAINT:   Chief Complaint  Patient presents with  . Abdominal Pain    HISTORY OF PRESENT ILLNESS:  Terrence Strickland is a 56 y.o. African-American male with medical history significant for ADHD, depression, hypertension and alcohol abuse, who presented to the ER with acute onset of nausea with associated diarrhea over the last couple of days.  The patient was recently admitted here on 5/17 and discharged on 5/19.  He was managed for hematemesis and melena due to severe ulcerative reflux esophagitis that was seen on EGD on 5/18 as well as alcohol abuse, hypokalemia and alcohol intoxication.    ED Course: When he came to the ER, BP was 134/101  with otherwise normal vital signs.  Labs revealed a BUN of 54 and creatinine of 2.8 compared to 6/0.8 on 10/20/2021.  Sodium was 134 compared to 136 then and TURBT was 9.3 compared to 6.9 then.  GFR was 26 compared to more than 60 then.  CBC showed hemoconcentration with hemoglobin of 7.9 hematocrit 55.5 compared to 14.2/43.9 on 10/20/2021.  UA showed 100 protein and was otherwise unremarkable. Imaging: None.  The patient was given 1 L bolus of IV lactated Ringer and 4 mg of IV Zofran.  He will be admitted to a medical bed for further evaluation and management.  PAST MEDICAL HISTORY:   Past Medical History:  Diagnosis Date  . ADHD (attention deficit hyperactivity disorder)   . Alcohol abuse   . Depression   . Hypertension   . Irregular heart beat     PAST SURGICAL HISTORY:  History reviewed. No pertinent surgical history.  SOCIAL HISTORY:   Social History   Tobacco Use  . Smoking status: Every Day    Packs/day: 0.50    Years: 15.00    Pack years: 7.50    Types:  Cigarettes  . Smokeless tobacco: Never  Substance Use Topics  . Alcohol use: Yes    Comment: 6pack beer a day    FAMILY HISTORY:   Family History  Problem Relation Age of Onset  . Cancer Other   . Diabetes Other     DRUG ALLERGIES:   Allergies  Allergen Reactions  . Nsaids Other (See Comments)    hallucinations  . Tramadol Other (See Comments)    Chills, hallucinations, sweating      REVIEW OF SYSTEMS:   ROS As per history of present illness. All pertinent systems were reviewed above. Constitutional, HEENT, cardiovascular, respiratory, GI, GU, musculoskeletal, neuro, psychiatric, endocrine, integumentary and hematologic systems were reviewed and are otherwise negative/unremarkable except for positive findings mentioned above in the HPI.   MEDICATIONS AT HOME:   Prior to Admission medications   Medication Sig Start Date End Date Taking? Authorizing Provider  baclofen (LIORESAL) 10 MG tablet Take 1 tablet (10 mg total) by mouth 3 (three) times daily as needed (hiccoughs). 10/20/21 11/19/21  Sherryll Burger, Pratik D, DO  folic acid (FOLVITE) 1 MG tablet Take 1 tablet (1 mg total) by mouth daily. 10/21/21 11/20/21  Sherryll Burger, Pratik D, DO  pantoprazole (PROTONIX) 40 MG tablet Take 1 tablet (40 mg total) by mouth 2 (two) times daily. 10/20/21 11/19/21  Sherryll Burger, Pratik D, DO  promethazine (PHENERGAN)  12.5 MG tablet Take 1 tablet (12.5 mg total) by mouth every 6 (six) hours as needed for nausea. 10/20/21   Sherryll BurgerShah, Pratik D, DO  sucralfate (CARAFATE) 1 GM/10ML suspension Take 10 mLs (1 g total) by mouth 4 (four) times daily -  with meals and at bedtime. 10/20/21 11/19/21  Sherryll BurgerShah, Pratik D, DO      VITAL SIGNS:  Blood pressure (!) 130/108, pulse 92, temperature 98.1 F (36.7 C), temperature source Oral, resp. rate 17, height 5\' 11"  (1.803 m), weight 56.7 kg, SpO2 98 %.  PHYSICAL EXAMINATION:  Physical Exam  GENERAL:  56 y.o.-year-old patient lying in the bed with no acute distress.  EYES: Pupils equal,  round, reactive to light and accommodation. No scleral icterus. Extraocular muscles intact.  HEENT: Head atraumatic, normocephalic. Oropharynx and nasopharynx clear.  NECK:  Supple, no jugular venous distention. No thyroid enlargement, no tenderness.  LUNGS: Normal breath sounds bilaterally, no wheezing, rales,rhonchi or crepitation. No use of accessory muscles of respiration.  CARDIOVASCULAR: Regular rate and rhythm, S1, S2 normal. No murmurs, rubs, or gallops.  ABDOMEN: Soft, nondistended, nontender. Bowel sounds present. No organomegaly or mass.  EXTREMITIES: No pedal edema, cyanosis, or clubbing.  NEUROLOGIC: Cranial nerves II through XII are intact. Muscle strength 5/5 in all extremities. Sensation intact. Gait not checked.  PSYCHIATRIC: The patient is alert and oriented x 3.  Normal affect and good eye contact. SKIN: No obvious rash, lesion, or ulcer.   LABORATORY PANEL:   CBC Recent Labs  Lab 10/25/21 1803  WBC 5.3  HGB 17.9*  HCT 55.5*  PLT 370   ------------------------------------------------------------------------------------------------------------------  Chemistries  Recent Labs  Lab 10/25/21 1803  NA 134*  K 4.3  CL 101  CO2 23  GLUCOSE 116*  BUN 54*  CREATININE 2.80*  CALCIUM 9.3  AST 36  ALT 22  ALKPHOS 81  BILITOT 0.4   ------------------------------------------------------------------------------------------------------------------  Cardiac Enzymes No results for input(s): TROPONINI in the last 168 hours. ------------------------------------------------------------------------------------------------------------------  RADIOLOGY:  No results found.    IMPRESSION AND PLAN:  Assessment and Plan: No notes have been filed under this hospital service. Service: Hospitalist      DVT prophylaxis: Lovenox***  Advanced Care Planning:  Code Status: full code***  Family Communication:  The plan of care was discussed in details with the patient (and  family). I answered all questions. The patient agreed to proceed with the above mentioned plan. Further management will depend upon hospital course. Disposition Plan: Back to previous home environment Consults called: none***  All the records are reviewed and case discussed with ED provider.  Status is: Inpatient {Inpatient:23812}   At the time of the admission, it appears that the appropriate admission status for this patient is inpatient.  This is judged to be reasonable and necessary in order to provide the required intensity of service to ensure the patient's safety given the presenting symptoms, physical exam findings and initial radiographic and laboratory data in the context of comorbid conditions.  The patient requires inpatient status due to high intensity of service, high risk of further deterioration and high frequency of surveillance required.  I certify that at the time of admission, it is my clinical judgment that the patient will require inpatient hospital care extending more than 2 midnights.                            Dispo: The patient is from: Home  Anticipated d/c is to: Home              Patient currently is not medically stable to d/c.              Difficult to place patient: No  Hannah Beat M.D on 10/25/2021 at 11:38 PM  [facility]   From 7 PM-7 AM, contact night-coverage [email or web address]  CC: Primary care physician; Benetta Spar, MD

## 2021-10-25 NOTE — Assessment & Plan Note (Signed)
We will continue PPI therapy and Carafate. ?

## 2021-10-25 NOTE — ED Triage Notes (Signed)
Pt having abdominal pain x1 week with nausea, admitted for stomach problems several weeks ago.

## 2021-10-25 NOTE — ED Provider Notes (Signed)
[facility] EMERGENCY DEPARTMENT Provider Note   CSN: [id number] Arrival date & time: 10/25/21  1646     History  Chief Complaint  Patient presents with   Abdominal Pain    Terrence Strickland is a 56 y.o. male.  The history is provided by the patient.  Abdominal Pain He has history of hypertension, alcohol abuse and had a recent hospital admission for hematemesis comes in because of diarrhea and abdominal cramping for the last 2 days.  He has had subjective fever without chills or sweats.  He has had some nausea.  Patient states that he has not vomited, his wife states that he has.  Wife is concerned because she saw a lump in his right groin, but he is not complaining of any pain there.  He states that he has not been eating much because food smells like acid reflux and he does not have any appetite.  He states that he has been drinking.   Home Medications Prior to Admission medications   Medication Sig Start Date End Date Taking? Authorizing Provider  baclofen (LIORESAL) 10 MG tablet Take 1 tablet (10 mg total) by mouth 3 (three) times daily as needed (hiccoughs). 10/20/21 11/19/21  Sherryll Burger, Pratik D, DO  folic acid (FOLVITE) 1 MG tablet Take 1 tablet (1 mg total) by mouth daily. 10/21/21 11/20/21  Sherryll Burger, Pratik D, DO  pantoprazole (PROTONIX) 40 MG tablet Take 1 tablet (40 mg total) by mouth 2 (two) times daily. 10/20/21 11/19/21  Sherryll Burger, Pratik D, DO  promethazine (PHENERGAN) 12.5 MG tablet Take 1 tablet (12.5 mg total) by mouth every 6 (six) hours as needed for nausea. 10/20/21   Sherryll Burger, Pratik D, DO  sucralfate (CARAFATE) 1 GM/10ML suspension Take 10 mLs (1 g total) by mouth 4 (four) times daily -  with meals and at bedtime. 10/20/21 11/19/21  Maurilio Lovely D, DO      Allergies    Nsaids and Tramadol    Review of Systems   Review of Systems  Gastrointestinal:  Positive for abdominal pain.  All other systems reviewed and are negative.  Physical Exam Updated Vital Signs BP (!) 130/108 (BP  Location: Left Arm)   Pulse 92   Temp 98.1 F (36.7 C) (Oral)   Resp 17   Ht 5\' 11"  (1.803 m)   Wt 56.7 kg   SpO2 98%   BMI 17.43 kg/m  Physical Exam Vitals and nursing note reviewed.  56 year old male, resting comfortably and in no acute distress. Vital signs are significant for elevated blood pressure. Oxygen saturation is 98%, which is normal. Head is normocephalic and atraumatic. PERRLA, EOMI. Oropharynx is clear. Neck is nontender and supple without adenopathy or JVD. Back is nontender and there is no CVA tenderness. Lungs are clear without rales, wheezes, or rhonchi. Chest is nontender. Heart has regular rate and rhythm without murmur. Abdomen is soft, flat, nontender without masses or hepatosplenomegaly and peristalsis is slightly hypoactive.  Direct inguinal hernia is present on the right. Extremities have no cyanosis or edema, full range of motion is present. Skin is warm and dry without rash. Neurologic: Mental status is normal, cranial nerves are intact, moves all extremities equally.  ED Results / Procedures / Treatments   Labs (all labs ordered are listed, but only abnormal results are displayed) Labs Reviewed  COMPREHENSIVE METABOLIC PANEL - Abnormal; Notable for the following components:      Result Value   Sodium 134 (*)    Glucose, Bld 116 (*)  BUN 54 (*)    Creatinine, Ser 2.80 (*)    Total Protein 9.3 (*)    GFR, Estimated 26 (*)    All other components within normal limits  CBC - Abnormal; Notable for the following components:   RBC 5.97 (*)    Hemoglobin 17.9 (*)    HCT 55.5 (*)    All other components within normal limits  URINALYSIS, ROUTINE W REFLEX MICROSCOPIC - Abnormal; Notable for the following components:   Protein, ur 100 (*)    Bacteria, UA RARE (*)    All other components within normal limits  LIPASE, BLOOD   Procedures Procedures    Medications Ordered in ED Medications  lactated ringers bolus 1,000 mL (has no administration in  time range)  ondansetron (ZOFRAN) injection 4 mg (has no administration in time range)    ED Course/ Medical Decision Making/ A&P                           Medical Decision Making Amount and/or Complexity of Data Reviewed Labs: ordered.   Nausea, abdominal cramping, diarrhea of uncertain cause.  Consider viral gastroenteritis, diverticulitis, GERD, peptic ulcer disease.  Labs were obtained and I have independently reviewed and interpreted them.  There is evidence of an acute kidney injury with creatinine 2.80 compared with 0.80 on 10/20/2021, BUN is increased to 54 compared with 6 on 10/20/2021.  Mild hyponatremia is present, not felt to be clinically significant.  Hemoglobin has increased and is 17.9 compared with 14.2 on 10/20/2021.  This constellation of laboratory findings is most consistent with dehydration.  Urinalysis is normal including normal specific gravity.  Old records reviewed showing hospitalization for hematemesis on 10/18/2021 to 10/20/2021.  During that hospitalization, EGD showed severe ulcerative reflux esophagitis.  His medications have been reviewed, he is on no nephrotoxic agents.  He is given IV fluids, ondansetron for nausea and will need to be admitted for management of his acute kidney injury.  Case is discussed with Dr. Arville Care of [facility], who agrees to admit the patient.  Final Clinical Impression(s) / ED Diagnoses Final diagnoses:  Acute kidney injury (nontraumatic) (HCC)  Hyponatremia  Polycythemia  Elevated blood pressure reading with diagnosis of hypertension    Rx / DC Orders ED Discharge Orders     None         Dione Booze, MD 10/25/21 2320

## 2021-10-25 NOTE — H&P (Signed)
[place]   PATIENT NAME: Terrence PalmerJames Strickland    MR#:  [id number]  DATE OF BIRTH:  08/08/1965  DATE OF ADMISSION:  10/25/2021  PRIMARY CARE PHYSICIAN: Benetta SparFanta, Tesfaye Demissie, MD   Patient is coming from: Home  REQUESTING/REFERRING PHYSICIAN: Dione BoozeGlick, David, MD  CHIEF COMPLAINT:   Chief Complaint  Patient presents with  . Abdominal Pain  Nausea, vomiting and diarrhea  HISTORY OF PRESENT ILLNESS:  Terrence Strickland is a 56 y.o. African-American male with medical history significant for ADHD, depression, hypertension and alcohol abuse, who presented to the ER with acute onset of epigastric abdominal pain with nausea and  nonbloody and nonbilious vomiting, with associated watery diarrhea over the last couple of days.  No recent antibiotic intake.  He denies any fever or chills.  He has not been having much appetite and stated that he has been laying around since he was discharged.  No dysuria however has been having oliguria with no frequency or urgency or flank pain.  His last alcoholic drink was on Sunday when he had 2 beers.  The patient was recently admitted here on 5/17 and discharged on 5/19.  He was managed for hematemesis and melena due to severe ulcerative reflux esophagitis that was seen on EGD on 5/18 as well as alcohol abuse, hypokalemia and alcohol intoxication.  His reflux esophagitis were thought related to NSAIDs with Aleve intake as well as alcohol.  ED Course: When he came to the ER, BP was 134/101  with otherwise normal vital signs.  Labs revealed a BUN of 54 and creatinine of 2.8 compared to 6/0.8 on 10/20/2021.  Sodium was 134 compared to 136 then and TURBT was 9.3 compared to 6.9 then.  GFR was 26 compared to more than 60 then.  CBC showed hemoconcentration with hemoglobin of 7.9 hematocrit 55.5 compared to 14.2/43.9 on 10/20/2021.  UA showed 100 protein and was otherwise unremarkable. Imaging: None.  The patient was given 1 L bolus of IV lactated Ringer and 4 mg of IV Zofran.   He will be admitted to a medical bed for further evaluation and management.  PAST MEDICAL HISTORY:   Past Medical History:  Diagnosis Date  . ADHD (attention deficit hyperactivity disorder)   . Alcohol abuse   . Depression   . Hypertension   . Irregular heart beat     PAST SURGICAL HISTORY:  History reviewed. No pertinent surgical history.  SOCIAL HISTORY:   Social History   Tobacco Use  . Smoking status: Every Day    Packs/day: 0.50    Years: 15.00    Pack years: 7.50    Types: Cigarettes  . Smokeless tobacco: Never  Substance Use Topics  . Alcohol use: Yes    Comment: 6pack beer a day    FAMILY HISTORY:   Family History  Problem Relation Age of Onset  . Cancer Other   . Diabetes Other     DRUG ALLERGIES:   Allergies  Allergen Reactions  . Nsaids Other (See Comments)    hallucinations  . Tramadol Other (See Comments)    Chills, hallucinations, sweating      REVIEW OF SYSTEMS:   ROS As per history of present illness. All pertinent systems were reviewed above. Constitutional, HEENT, cardiovascular, respiratory, GI, GU, musculoskeletal, neuro, psychiatric, endocrine, integumentary and hematologic systems were reviewed and are otherwise negative/unremarkable except for positive findings mentioned above in the HPI.   MEDICATIONS AT HOME:   Prior to Admission medications  Medication Sig Start Date End Date Taking? Authorizing Provider  baclofen (LIORESAL) 10 MG tablet Take 1 tablet (10 mg total) by mouth 3 (three) times daily as needed (hiccoughs). 10/20/21 11/19/21  Sherryll Burger, Pratik D, DO  folic acid (FOLVITE) 1 MG tablet Take 1 tablet (1 mg total) by mouth daily. 10/21/21 11/20/21  Sherryll Burger, Pratik D, DO  pantoprazole (PROTONIX) 40 MG tablet Take 1 tablet (40 mg total) by mouth 2 (two) times daily. 10/20/21 11/19/21  Sherryll Burger, Pratik D, DO  promethazine (PHENERGAN) 12.5 MG tablet Take 1 tablet (12.5 mg total) by mouth every 6 (six) hours as needed for nausea. 10/20/21    Sherryll Burger, Pratik D, DO  sucralfate (CARAFATE) 1 GM/10ML suspension Take 10 mLs (1 g total) by mouth 4 (four) times daily -  with meals and at bedtime. 10/20/21 11/19/21  Sherryll Burger, Pratik D, DO      VITAL SIGNS:  Blood pressure (!) 130/108, pulse 92, temperature 98.1 F (36.7 C), temperature source Oral, resp. rate 17, height 5\' 11"  (1.803 m), weight 56.7 kg, SpO2 98 %.  PHYSICAL EXAMINATION:  Physical Exam  GENERAL:  56 y.o.-year-old African-American male patient lying in the bed with no acute distress.  EYES: Pupils equal, round, reactive to light and accommodation. No scleral icterus. Extraocular muscles intact.  HEENT: Head atraumatic, normocephalic. Oropharynx with dry mucous membrane and tongue and nasopharynx clear.  NECK:  Supple, no jugular venous distention. No thyroid enlargement, no tenderness.  LUNGS: Normal breath sounds bilaterally, no wheezing, rales,rhonchi or crepitation. No use of accessory muscles of respiration.  CARDIOVASCULAR: Regular rate and rhythm, S1, S2 normal. No murmurs, rubs, or gallops.  ABDOMEN: Soft, nondistended, with mild epigastric tenderness without rebound tenderness guarding or rigidity.. Bowel sounds present. No organomegaly or mass.  EXTREMITIES: No pedal edema, cyanosis, or clubbing.  NEUROLOGIC: Cranial nerves II through XII are intact. Muscle strength 5/5 in all extremities. Sensation intact. Gait not checked.  PSYCHIATRIC: The patient is alert and oriented x 3.  Normal affect and good eye contact. SKIN: No obvious rash, lesion, or ulcer.   LABORATORY PANEL:   CBC Recent Labs  Lab 10/25/21 1803  WBC 5.3  HGB 17.9*  HCT 55.5*  PLT 370   ------------------------------------------------------------------------------------------------------------------  Chemistries  Recent Labs  Lab 10/25/21 1803  NA 134*  K 4.3  CL 101  CO2 23  GLUCOSE 116*  BUN 54*  CREATININE 2.80*  CALCIUM 9.3  AST 36  ALT 22  ALKPHOS 81  BILITOT 0.4    ------------------------------------------------------------------------------------------------------------------  Cardiac Enzymes No results for input(s): TROPONINI in the last 168 hours. ------------------------------------------------------------------------------------------------------------------  RADIOLOGY:  No results found.    IMPRESSION AND PLAN:  Assessment and Plan: * AKI (acute kidney injury) (HCC) - This is likely prerenal secondary to volume depletion and dehydration from recurrent nausea, vomiting and diarrhea. - The patient will be admitted to a medical bed. - We will continue hydration with IV normal saline. - We will follow serial BMPs. - We will avoid nephrotoxins.  Acute gastroenteritis -I Suspect viral etiology. - He has not been taking any recent antibiotics. - He will be hydrated as mentioned above. - As needed antiemetics and antidiarrheals will be provided. - We will just obtain stool studies for now.  Elevated blood pressure reading - The patient will be placed on as needed IV labetalol. - His BP has been occasionally elevated during his last admission. - We will continue monitoring this time. - He may need management for his hypertension.  Would  avoid diuretics certainly given his AKI.  Hyponatremia - This is clearly hypovolemic. - She will be hydrated with IV normal saline and will follow his BMP.  Reflux esophagitis - We will continue PPI therapy and Carafate.  Tobacco abuse - I counseled him for smoking cessation and he will receive further counseling here.  Alcohol abuse - We will place him on banana bag daily. - I counseled him for cessation. - He does not seem to be in withdrawal at this time.  He will be monitored for it.   DVT prophylaxis: Lovenox.  Advanced Care Planning:  Code Status: full code.  Family Communication:  The plan of care was discussed in details with the patient (and family). I answered all questions. The  patient agreed to proceed with the above mentioned plan. Further management will depend upon hospital course. Disposition Plan: Back to previous home environment Consults called: none.  All the records are reviewed and case discussed with ED provider.  Status is: Inpatient  At the time of the admission, it appears that the appropriate admission status for this patient is inpatient.  This is judged to be reasonable and necessary in order to provide the required intensity of service to ensure the patient's safety given the presenting symptoms, physical exam findings and initial radiographic and laboratory data in the context of comorbid conditions.  The patient requires inpatient status due to high intensity of service, high risk of further deterioration and high frequency of surveillance required.  I certify that at the time of admission, it is my clinical judgment that the patient will require inpatient hospital care extending more than 2 midnights.                            Dispo: The patient is from: Home              Anticipated d/c is to: Home              Patient currently is not medically stable to d/c.              Difficult to place patient: No  Hannah Beat M.D on 10/26/2021 at 12:16 AM  [facility]   From 7 PM-7 AM, contact night-coverage [email or web address]  CC: Primary care physician; Benetta Spar, MD

## 2021-10-25 NOTE — Assessment & Plan Note (Addendum)
-   This is likely prerenal secondary to volume depletion and dehydration from recurrent nausea, vomiting and diarrhea. - The patient will be admitted to a medical bed. - We will continue hydration with IV normal saline. - We will follow serial BMPs.  RESOLVED after IV fluid hydration.  - We will avoid nephrotoxins.

## 2021-10-25 NOTE — Assessment & Plan Note (Addendum)
-   amlodipine 5 mg daily well manages BP

## 2021-10-26 ENCOUNTER — Encounter (HOSPITAL_COMMUNITY): Payer: Self-pay | Admitting: Internal Medicine

## 2021-10-26 DIAGNOSIS — I10 Essential (primary) hypertension: Secondary | ICD-10-CM

## 2021-10-26 DIAGNOSIS — K529 Noninfective gastroenteritis and colitis, unspecified: Secondary | ICD-10-CM | POA: Diagnosis not present

## 2021-10-26 DIAGNOSIS — E871 Hypo-osmolality and hyponatremia: Secondary | ICD-10-CM | POA: Diagnosis present

## 2021-10-26 DIAGNOSIS — N179 Acute kidney failure, unspecified: Secondary | ICD-10-CM | POA: Diagnosis not present

## 2021-10-26 DIAGNOSIS — K2100 Gastro-esophageal reflux disease with esophagitis, without bleeding: Secondary | ICD-10-CM

## 2021-10-26 DIAGNOSIS — Z720 Tobacco use: Secondary | ICD-10-CM | POA: Diagnosis present

## 2021-10-26 LAB — BASIC METABOLIC PANEL
Anion gap: 9 (ref 5–15)
BUN: 54 mg/dL — ABNORMAL HIGH (ref 6–20)
CO2: 22 mmol/L (ref 22–32)
Calcium: 8.6 mg/dL — ABNORMAL LOW (ref 8.9–10.3)
Chloride: 104 mmol/L (ref 98–111)
Creatinine, Ser: 2.16 mg/dL — ABNORMAL HIGH (ref 0.61–1.24)
GFR, Estimated: 35 mL/min — ABNORMAL LOW (ref >60)
Glucose, Bld: 115 mg/dL — ABNORMAL HIGH (ref 70–99)
Potassium: 3.7 mmol/L (ref 3.5–5.1)
Sodium: 135 mmol/L (ref 135–145)

## 2021-10-26 LAB — CBC
HCT: 49.8 % (ref 39.0–52.0)
Hemoglobin: 16.2 g/dL (ref 13.0–17.0)
MCH: 29.8 pg (ref 26.0–34.0)
MCHC: 32.5 g/dL (ref 30.0–36.0)
MCV: 91.7 fL (ref 80.0–100.0)
Platelets: 331 10*3/uL (ref 150–400)
RBC: 5.43 MIL/uL (ref 4.22–5.81)
RDW: 13.5 % (ref 11.5–15.5)
WBC: 7.3 10*3/uL (ref 4.0–10.5)
nRBC: 0 % (ref 0.0–0.2)

## 2021-10-26 MED ORDER — ENOXAPARIN SODIUM 40 MG/0.4ML IJ SOSY
40.0000 mg | PREFILLED_SYRINGE | INTRAMUSCULAR | Status: DC
  Administered 2021-10-26 – 2021-10-27 (×2): 40 mg via SUBCUTANEOUS
  Filled 2021-10-26 (×2): qty 0.4

## 2021-10-26 MED ORDER — LOPERAMIDE HCL 2 MG PO CAPS
2.0000 mg | ORAL_CAPSULE | ORAL | Status: DC | PRN
  Administered 2021-10-26: 2 mg via ORAL
  Filled 2021-10-26 (×2): qty 1

## 2021-10-26 MED ORDER — LABETALOL HCL 5 MG/ML IV SOLN: 5.0000 mg | INTRAVENOUS | Status: DC | PRN

## 2021-10-26 MED ORDER — NICOTINE 21 MG/24HR TD PT24
21.0000 mg | MEDICATED_PATCH | Freq: Every day | TRANSDERMAL | Status: DC | PRN
Start: 2021-10-26 — End: 2021-10-27

## 2021-10-26 MED ORDER — AMLODIPINE BESYLATE 5 MG PO TABS
5.0000 mg | ORAL_TABLET | Freq: Every day | ORAL | Status: DC
Start: 2021-10-26 — End: 2021-10-27
  Administered 2021-10-26 – 2021-10-27 (×2): 5 mg via ORAL
  Filled 2021-10-26 (×2): qty 1

## 2021-10-26 MED ORDER — LABETALOL HCL 5 MG/ML IV SOLN
20.0000 mg | INTRAVENOUS | Status: DC | PRN
Start: 2021-10-26 — End: 2021-10-26
  Administered 2021-10-26: 20 mg via INTRAVENOUS
  Filled 2021-10-26: qty 4

## 2021-10-26 NOTE — Assessment & Plan Note (Addendum)
-   I counseled him for smoking cessation and he will receive further counseling here.  Nicotine patch.

## 2021-10-26 NOTE — Assessment & Plan Note (Addendum)
-   Secondary to hypovolemia. - Resolved with IV Fluids

## 2021-10-26 NOTE — Assessment & Plan Note (Addendum)
-   We will place him on banana bag daily. - I counseled him for cessation. - He does not seem to be in withdrawal at this time.  He will be monitored for it.

## 2021-10-26 NOTE — Hospital Course (Addendum)
56 y.o. African-American male with medical history significant for ADHD, depression, hypertension and alcohol abuse, who presented to the ER with acute onset of epigastric abdominal pain with nausea and  nonbloody and nonbilious vomiting, with associated watery diarrhea over the last couple of days.  No recent antibiotic intake.  He denies any fever or chills.  He has not been having much appetite and stated that he has been laying around since he was discharged.  No dysuria however has been having oliguria with no frequency or urgency or flank pain.  His last alcoholic drink was on Sunday when he had 2 beers.  The patient was recently admitted here on 5/17 and discharged on 5/19.  He was managed for hematemesis and melena due to severe ulcerative reflux esophagitis that was seen on EGD on 5/18 as well as alcohol abuse, hypokalemia and alcohol intoxication.  His reflux esophagitis were thought related to NSAIDs with Aleve intake as well as alcohol.   ED Course: When he came to the ER, BP was 134/101  with otherwise normal vital signs.  Labs revealed a BUN of 54 and creatinine of 2.8 compared to 6/0.8 on 10/20/2021.  Sodium was 134 compared to 136 then and TURBT was 9.3 compared to 6.9 then.  GFR was 26 compared to more than 60 then.  CBC showed hemoconcentration with hemoglobin of 7.9 hematocrit 55.5 compared to 14.2/43.9 on 10/20/2021.  UA showed 100 protein and was otherwise unremarkable. Imaging: None.  The patient was given 1 L bolus of IV lactated Ringer and 4 mg of IV Zofran.  He will be admitted to a medical bed for further evaluation and management.  10/27/2021:  Pt reports feeling better, renal function has improved with hydration and diarrhea is resolved.  DC  home today.

## 2021-10-26 NOTE — TOC Progression Note (Signed)
  Transition of Care [facility]) Screening Note   Patient Details  Name: Terrence Strickland Date of Birth: April 13, 1966   [facility]) CM/SW Contact:    Elliot Gault, LCSW Phone Number: 10/26/2021, 11:14 AM    Transition of Care Department [facility]) has reviewed patient and no TOC needs have been identified at this time. We will continue to monitor patient advancement through interdisciplinary progression rounds. If new patient transition needs arise, please place a TOC consult.

## 2021-10-26 NOTE — Assessment & Plan Note (Addendum)
-  I Suspect viral etiology. ROTAVIRUS found on GI path panel.   - He has not been taking any recent antibiotics. - He will be hydrated as mentioned above. - As needed antiemetics and antidiarrheals will be provided.  He was treated supportively and feeling better now.  DC home.

## 2021-10-27 ENCOUNTER — Encounter (HOSPITAL_COMMUNITY): Payer: Self-pay | Admitting: Family Medicine

## 2021-10-27 DIAGNOSIS — F1010 Alcohol abuse, uncomplicated: Secondary | ICD-10-CM

## 2021-10-27 DIAGNOSIS — Z720 Tobacco use: Secondary | ICD-10-CM

## 2021-10-27 DIAGNOSIS — I10 Essential (primary) hypertension: Secondary | ICD-10-CM | POA: Diagnosis not present

## 2021-10-27 DIAGNOSIS — K529 Noninfective gastroenteritis and colitis, unspecified: Secondary | ICD-10-CM | POA: Diagnosis not present

## 2021-10-27 DIAGNOSIS — N179 Acute kidney failure, unspecified: Secondary | ICD-10-CM | POA: Diagnosis not present

## 2021-10-27 LAB — GASTROINTESTINAL PANEL BY PCR, STOOL (REPLACES STOOL CULTURE)

## 2021-10-27 LAB — ALBUMIN: Albumin: 3.2 g/dL — ABNORMAL LOW (ref 3.5–5.0)

## 2021-10-27 LAB — BASIC METABOLIC PANEL
Anion gap: 4 — ABNORMAL LOW (ref 5–15)
BUN: 22 mg/dL — ABNORMAL HIGH (ref 6–20)
CO2: 22 mmol/L (ref 22–32)
Calcium: 7.8 mg/dL — ABNORMAL LOW (ref 8.9–10.3)
Chloride: 110 mmol/L (ref 98–111)
Creatinine, Ser: 1.08 mg/dL (ref 0.61–1.24)
GFR, Estimated: >60 mL/min (ref >60)
Glucose, Bld: 88 mg/dL (ref 70–99)
Potassium: 3.4 mmol/L — ABNORMAL LOW (ref 3.5–5.1)
Sodium: 136 mmol/L (ref 135–145)

## 2021-10-27 LAB — MAGNESIUM: Magnesium: 2.1 mg/dL (ref 1.7–2.4)

## 2021-10-27 MED ORDER — AMLODIPINE BESYLATE 5 MG PO TABS: 5.0000 mg | ORAL_TABLET | Freq: Every day | ORAL | 1 refills | Status: DC

## 2021-10-27 MED ORDER — POTASSIUM CHLORIDE CRYS ER 20 MEQ PO TBCR
40.0000 meq | EXTENDED_RELEASE_TABLET | Freq: Once | ORAL | Status: AC
Start: 2021-10-27 — End: 2021-10-27
  Administered 2021-10-27: 40 meq via ORAL
  Filled 2021-10-27: qty 2

## 2021-10-27 NOTE — Discharge Summary (Signed)
Physician Discharge Summary  Terrence Strickland:[id number] DOB: December 03, 1965 DOA: 10/25/2021  PCP: Benetta Spar, MD  Admit date: 10/25/2021 Discharge date: 10/27/2021  Admitted From:  Home  Disposition: Home   Recommendations for Outpatient Follow-up:  Follow up with PCP in 1 weeks Please obtain BMP IN 1-2 weeks to follow up renal function   Discharge Condition: STABLE   CODE STATUS: FULL  DIET:  RESUME HEART HEALTHY DIET   Brief Hospitalization Summary: Please see all hospital notes, images, labs for full details of the hospitalization. 56 y.o. African-American male with medical history significant for ADHD, depression, hypertension and alcohol abuse, who presented to the ER with acute onset of epigastric abdominal pain with nausea and  nonbloody and nonbilious vomiting, with associated watery diarrhea over the last couple of days.  No recent antibiotic intake.  He denies any fever or chills.  He has not been having much appetite and stated that he has been laying around since he was discharged.  No dysuria however has been having oliguria with no frequency or urgency or flank pain.  His last alcoholic drink was on Sunday when he had 2 beers.  The patient was recently admitted here on 5/17 and discharged on 5/19.  He was managed for hematemesis and melena due to severe ulcerative reflux esophagitis that was seen on EGD on 5/18 as well as alcohol abuse, hypokalemia and alcohol intoxication.  His reflux esophagitis were thought related to NSAIDs with Aleve intake as well as alcohol.   ED Course: When he came to the ER, BP was 134/101  with otherwise normal vital signs.  Labs revealed a BUN of 54 and creatinine of 2.8 compared to 6/0.8 on 10/20/2021.  Sodium was 134 compared to 136 then and TURBT was 9.3 compared to 6.9 then.  GFR was 26 compared to more than 60 then.  CBC showed hemoconcentration with hemoglobin of 7.9 hematocrit 55.5 compared to 14.2/43.9 on 10/20/2021.  UA showed 100  protein and was otherwise unremarkable. Imaging: None.  The patient was given 1 L bolus of IV lactated Ringer and 4 mg of IV Zofran.  He will be admitted to a medical bed for further evaluation and management.  10/27/2021:  Pt reports feeling better, renal function has improved with hydration and diarrhea is resolved.  DC  home today.    HOSPITAL COURSE BY PROBLEM LIST  Assessment and Plan: * ROTAVIRUS Acute gastroenteritis -I Suspect viral etiology. ROTAVIRUS found on GI path panel.   - He has not been taking any recent antibiotics. - He will be hydrated as mentioned above. - As needed antiemetics and antidiarrheals will be provided.  He was treated supportively and feeling better now.  DC home.    Tobacco abuse - I counseled him for smoking cessation and he will receive further counseling here.  Nicotine patch.   Hyponatremia - Secondary to hypovolemia. - Resolved with IV Fluids   Reflux esophagitis - We will continue PPI therapy and Carafate.  Essential hypertension - amlodipine 5 mg daily well manages BP  AKI (acute kidney injury) (HCC) - This is likely prerenal secondary to volume depletion and dehydration from recurrent nausea, vomiting and diarrhea. - The patient will be admitted to a medical bed. - We will continue hydration with IV normal saline. - We will follow serial BMPs.  RESOLVED after IV fluid hydration.  - We will avoid nephrotoxins.  Alcohol abuse - We will place him on banana bag daily. - I counseled him for  cessation. - He does not seem to be in withdrawal at this time.  He will be monitored for it.  Discharge Diagnoses:  Principal Problem:   ROTAVIRUS Acute gastroenteritis Active Problems:   Alcohol abuse   AKI (acute kidney injury) (HCC)   Essential hypertension   Reflux esophagitis   Hyponatremia   Tobacco abuse   Discharge Instructions:  Allergies as of 10/27/2021       Reactions   Nsaids Other (See Comments)   hallucinations    Tramadol Other (See Comments)   Chills, hallucinations, sweating        Medication List     TAKE these medications    amLODipine 5 MG tablet Commonly known as: NORVASC Take 1 tablet (5 mg total) by mouth daily. Start taking on: Oct 28, 2021   baclofen 10 MG tablet Commonly known as: LIORESAL Take 1 tablet (10 mg total) by mouth 3 (three) times daily as needed (hiccoughs).   folic acid 1 MG tablet Commonly known as: FOLVITE Take 1 tablet (1 mg total) by mouth daily.   pantoprazole 40 MG tablet Commonly known as: Protonix Take 1 tablet (40 mg total) by mouth 2 (two) times daily.   promethazine 12.5 MG tablet Commonly known as: PHENERGAN Take 1 tablet (12.5 mg total) by mouth every 6 (six) hours as needed for nausea.   sucralfate 1 GM/10ML suspension Commonly known as: CARAFATE Take 10 mLs (1 g total) by mouth 4 (four) times daily -  with meals and at bedtime.        Follow-up Information     [name], [name], MD. Schedule an appointment as soon as possible for a visit in 1 week(s).   Specialty: Internal Medicine Why: Hospital Follow Up Contact information: [address] [place] [address] [phone]                Allergies  Allergen Reactions   Nsaids Other (See Comments)    hallucinations   Tramadol Other (See Comments)    Chills, hallucinations, sweating     Allergies as of 10/27/2021       Reactions   Nsaids Other (See Comments)   hallucinations   Tramadol Other (See Comments)   Chills, hallucinations, sweating        Medication List     TAKE these medications    amLODipine 5 MG tablet Commonly known as: NORVASC Take 1 tablet (5 mg total) by mouth daily. Start taking on: Oct 28, 2021   baclofen 10 MG tablet Commonly known as: LIORESAL Take 1 tablet (10 mg total) by mouth 3 (three) times daily as needed (hiccoughs).   folic acid 1 MG tablet Commonly known as: FOLVITE Take 1 tablet (1 mg total) by mouth  daily.   pantoprazole 40 MG tablet Commonly known as: Protonix Take 1 tablet (40 mg total) by mouth 2 (two) times daily.   promethazine 12.5 MG tablet Commonly known as: PHENERGAN Take 1 tablet (12.5 mg total) by mouth every 6 (six) hours as needed for nausea.   sucralfate 1 GM/10ML suspension Commonly known as: CARAFATE Take 10 mLs (1 g total) by mouth 4 (four) times daily -  with meals and at bedtime.       Procedures/Studies: CT ABDOMEN PELVIS W CONTRAST  Result Date: 10/18/2021 CLINICAL DATA:  Abdominal pain, nausea, vomiting, hiccups and hematemesis. EXAM: CT ABDOMEN AND PELVIS WITH CONTRAST TECHNIQUE: Multidetector CT imaging of the abdomen and pelvis was performed using the standard protocol following bolus  administration of intravenous contrast. RADIATION DOSE REDUCTION: This exam was performed according to the departmental dose-optimization program which includes automated exposure control, adjustment of the mA and/or kV according to patient size and/or use of iterative reconstruction technique. CONTRAST:  100mL OMNIPAQUE IOHEXOL 300 MG/ML  SOLN COMPARISON:  None. FINDINGS: Lower chest: Minimal dependent atelectasis bilaterally. Heart is enlarged. No pericardial effusion. Trace right pleural fluid. Distal esophageal wall thickening can be seen with gastroesophageal reflux. Small hiatal hernia. Hepatobiliary: Liver is slightly decreased in attenuation diffusely. Liver and gallbladder are otherwise unremarkable. No biliary ductal dilatation. Pancreas: Negative. Spleen: Negative. Adrenals/Urinary Tract: Adrenal glands are unremarkable. 2.0 cm low-attenuation lesion in the upper pole right kidney is indicative of a cyst. Apparent soft tissue density along the ventral margin is felt to be due to volume averaging with renal parenchyma. Additional low-attenuation lesions in the kidneys are too small to characterize but statistically, cysts are likely. No follow-up of these lesions is necessary.  Ureters are decompressed. Bladder is grossly unremarkable. Stomach/Bowel: Small hiatal hernia. Stomach, small bowel, appendix and colon are otherwise unremarkable. Vascular/Lymphatic: Atherosclerotic calcification of the aorta. No pathologically enlarged lymph nodes. Reproductive: Prostate is visualized. Other: Right inguinal hernia contains a knuckle of colon as well as fat. Mesenteries and peritoneum are otherwise unremarkable. Musculoskeletal: Degenerative changes in the spine and hips. No worrisome lytic or sclerotic lesions. IMPRESSION: 1. No findings to explain the patient's clinical history. 2. Trace right pleural effusion. 3. Hepatic steatosis. 4. Right inguinal hernia contains a knuckle of colon as well as fat. 5.  Aortic atherosclerosis (ICD10-I70.0). Electronically Signed   By: Leanna BattlesMelinda  Blietz M.D.   On: 10/18/2021 15:33   DG Chest Port 1 View  Result Date: 10/13/2021 CLINICAL DATA:  Chest pain EXAM: PORTABLE CHEST 1 VIEW COMPARISON:  06/23/2021 FINDINGS: The heart size and mediastinal contours are within normal limits. Both lungs are clear. No pleural effusion pneumothorax. The visualized skeletal structures are unremarkable. IMPRESSION: No acute process in the chest. Electronically Signed   By: Guadlupe SpanishPraneil  Patel M.D.   On: 10/13/2021 10:10     Subjective: Pt reports feeling much better and diarrhea resolved.   Discharge Exam: Vitals:   10/26/21 2058 10/27/21 0510  BP: (!) 149/106 129/87  Pulse: 64 (!) 57  Resp: 16 16  Temp: 98.8 F (37.1 C) 98.4 F (36.9 C)  SpO2: 100% 97%   Vitals:   10/26/21 0434 10/26/21 1254 10/26/21 2058 10/27/21 0510  BP: (!) 134/93 134/87 (!) 149/106 129/87  Pulse: 62 61 64 (!) 57  Resp: 19 15 16 16   Temp: 98.1 F (36.7 C) 97.6 F (36.4 C) 98.8 F (37.1 C) 98.4 F (36.9 C)  TempSrc: Oral  Oral Oral  SpO2: 97% 98% 100% 97%  Weight:      Height:       General: Pt is alert, awake, not in acute distress Cardiovascular: RRR, S1/S2 +, no rubs, no  gallops Respiratory: CTA bilaterally, no wheezing, no rhonchi Abdominal: Soft, NT, ND, bowel sounds + Extremities: no edema, no cyanosis   The results of significant diagnostics from this hospitalization (including imaging, microbiology, ancillary and laboratory) are listed below for reference.     Microbiology: Recent Results (from the past 240 hour(s))  Gastrointestinal Panel by PCR , Stool     Status: Abnormal   Collection Time: 10/26/21  3:03 AM   Specimen: Stool  Result Value Ref Range Status   Campylobacter species NOT DETECTED NOT DETECTED Final   Plesimonas shigelloides NOT DETECTED  NOT DETECTED Final   Salmonella species NOT DETECTED NOT DETECTED Final   Yersinia enterocolitica NOT DETECTED NOT DETECTED Final   Vibrio species NOT DETECTED NOT DETECTED Final   Vibrio cholerae NOT DETECTED NOT DETECTED Final   Enteroaggregative E coli (EAEC) NOT DETECTED NOT DETECTED Final   Enteropathogenic E coli (EPEC) NOT DETECTED NOT DETECTED Final   Enterotoxigenic E coli (ETEC) NOT DETECTED NOT DETECTED Final   Shiga like toxin producing E coli (STEC) NOT DETECTED NOT DETECTED Final   Shigella/Enteroinvasive E coli (EIEC) NOT DETECTED NOT DETECTED Final   Cryptosporidium NOT DETECTED NOT DETECTED Final   Cyclospora cayetanensis NOT DETECTED NOT DETECTED Final   Entamoeba histolytica NOT DETECTED NOT DETECTED Final   Giardia lamblia NOT DETECTED NOT DETECTED Final   Adenovirus F40/41 NOT DETECTED NOT DETECTED Final   Astrovirus NOT DETECTED NOT DETECTED Final   Norovirus GI/GII NOT DETECTED NOT DETECTED Final   Rotavirus A DETECTED (A) NOT DETECTED Final   Sapovirus (I, II, IV, and V) NOT DETECTED NOT DETECTED Final    Comment: Performed at [facility], [address] [place], [place] [address]     Labs: BNP (last 3 results) No results for input(s): BNP in the last 8760 hours. Basic Metabolic Panel: Recent Labs  Lab 10/25/21 1803 10/26/21 0443 10/27/21 0503  NA  134* 135 136  K 4.3 3.7 3.4*  CL 101 104 110  CO2 23 22 22   GLUCOSE 116* 115* 88  BUN 54* 54* 22*  CREATININE 2.80* 2.16* 1.08  CALCIUM 9.3 8.6* 7.8*  MG  --   --  2.1   Liver Function Tests: Recent Labs  Lab 10/25/21 1803 10/27/21 0503  AST 36  --   ALT 22  --   ALKPHOS 81  --   BILITOT 0.4  --   PROT 9.3*  --   ALBUMIN 4.6 3.2*   Recent Labs  Lab 10/25/21 1803  LIPASE 31   No results for input(s): AMMONIA in the last 168 hours. CBC: Recent Labs  Lab 10/25/21 1803 10/26/21 0443  WBC 5.3 7.3  HGB 17.9* 16.2  HCT 55.5* 49.8  MCV 93.0 91.7  PLT 370 331   Cardiac Enzymes: No results for input(s): CKTOTAL, CKMB, CKMBINDEX, TROPONINI in the last 168 hours. BNP: Invalid input(s): POCBNP CBG: No results for input(s): GLUCAP in the last 168 hours. D-Dimer No results for input(s): DDIMER in the last 72 hours. Hgb A1c No results for input(s): HGBA1C in the last 72 hours. Lipid Profile No results for input(s): CHOL, HDL, LDLCALC, TRIG, CHOLHDL, LDLDIRECT in the last 72 hours. Thyroid function studies No results for input(s): TSH, T4TOTAL, T3FREE, THYROIDAB in the last 72 hours.  Invalid input(s): FREET3 Anemia work up No results for input(s): VITAMINB12, FOLATE, FERRITIN, TIBC, IRON, RETICCTPCT in the last 72 hours. Urinalysis    Component Value Date/Time   COLORURINE YELLOW 10/25/2021 2103   APPEARANCEUR CLEAR 10/25/2021 2103   LABSPEC 1.021 10/25/2021 2103   PHURINE 5.0 10/25/2021 2103   GLUCOSEU NEGATIVE 10/25/2021 2103   HGBUR NEGATIVE 10/25/2021 2103   BILIRUBINUR NEGATIVE 10/25/2021 2103   KETONESUR NEGATIVE 10/25/2021 2103   PROTEINUR 100 (A) 10/25/2021 2103   NITRITE NEGATIVE 10/25/2021 2103   LEUKOCYTESUR NEGATIVE 10/25/2021 2103   Sepsis Labs Invalid input(s): PROCALCITONIN,  WBC,  LACTICIDVEN Microbiology Recent Results (from the past 240 hour(s))  Gastrointestinal Panel by PCR , Stool     Status: Abnormal   Collection Time: 10/26/21  3:03  AM   Specimen: Stool  Result Value Ref Range Status   Campylobacter species NOT DETECTED NOT DETECTED Final   Plesimonas shigelloides NOT DETECTED NOT DETECTED Final   Salmonella species NOT DETECTED NOT DETECTED Final   Yersinia enterocolitica NOT DETECTED NOT DETECTED Final   Vibrio species NOT DETECTED NOT DETECTED Final   Vibrio cholerae NOT DETECTED NOT DETECTED Final   Enteroaggregative E coli (EAEC) NOT DETECTED NOT DETECTED Final   Enteropathogenic E coli (EPEC) NOT DETECTED NOT DETECTED Final   Enterotoxigenic E coli (ETEC) NOT DETECTED NOT DETECTED Final   Shiga like toxin producing E coli (STEC) NOT DETECTED NOT DETECTED Final   Shigella/Enteroinvasive E coli (EIEC) NOT DETECTED NOT DETECTED Final   Cryptosporidium NOT DETECTED NOT DETECTED Final   Cyclospora cayetanensis NOT DETECTED NOT DETECTED Final   Entamoeba histolytica NOT DETECTED NOT DETECTED Final   Giardia lamblia NOT DETECTED NOT DETECTED Final   Adenovirus F40/41 NOT DETECTED NOT DETECTED Final   Astrovirus NOT DETECTED NOT DETECTED Final   Norovirus GI/GII NOT DETECTED NOT DETECTED Final   Rotavirus A DETECTED (A) NOT DETECTED Final   Sapovirus (I, II, IV, and V) NOT DETECTED NOT DETECTED Final    Comment: Performed at [facility], [address] [place], [place] [address]   Time coordinating discharge: 35 mins   SIGNED:  Standley Dakins, MD  [facility] 10/27/2021, 2:11 PM How to contact the [facility] Attending or Consulting provider 7A - 7P or covering provider during after hours 7P -7A, for this patient?  Check the care team in [facility] and look for a) attending/consulting [facility] provider listed and b) the [facility] team listed Log into [email or web address] and use 's universal password to access. If you do not have the password, please contact the hospital operator. Locate the [facility] provider you are looking for under [facility] and page to a number that you can be directly reached. If you  still have difficulty reaching the provider, please page the [facility] (Director on Call) for the Hospitalists listed on amion for assistance.

## 2021-10-27 NOTE — Discharge Instructions (Signed)
IMPORTANT INFORMATION: PAY CLOSE ATTENTION   PHYSICIAN DISCHARGE INSTRUCTIONS  Follow with Primary care provider  [name], Tesfaye Demissie, MD  and other consultants as instructed by your Hospitalist Physician  SEEK MEDICAL CARE OR RETURN TO EMERGENCY ROOM IF SYMPTOMS COME BACK, WORSEN OR NEW PROBLEM DEVELOPS   Please note: You were cared for by a hospitalist during your hospital stay. Every effort will be made to forward records to your primary care provider.  You can request that your primary care provider send for your hospital records if they have not received them.  Once you are discharged, your primary care physician will handle any further medical issues. Please note that NO REFILLS for any discharge medications will be authorized once you are discharged, as it is imperative that you return to your primary care physician (or establish a relationship with a primary care physician if you do not have one) for your post hospital discharge needs so that they can reassess your need for medications and monitor your lab values.  Please get a complete blood count and chemistry panel checked by your Primary MD at your next visit, and again as instructed by your Primary MD.  Get Medicines reviewed and adjusted: Please take all your medications with you for your next visit with your Primary MD  Laboratory/radiological data: Please request your Primary MD to go over all hospital tests and procedure/radiological results at the follow up, please ask your primary care provider to get all Hospital records sent to his/her office.  In some cases, they will be blood work, cultures and biopsy results pending at the time of your discharge. Please request that your primary care provider follow up on these results.  If you are diabetic, please bring your blood sugar readings with you to your follow up appointment with primary care.    Please call and make your follow up appointments as soon as possible.     Also Note the following: If you experience worsening of your admission symptoms, develop shortness of breath, life threatening emergency, suicidal or homicidal thoughts you must seek medical attention immediately by calling 911 or calling your MD immediately  if symptoms less severe.  You must read complete instructions/literature along with all the possible adverse reactions/side effects for all the Medicines you take and that have been prescribed to you. Take any new Medicines after you have completely understood and accpet all the possible adverse reactions/side effects.   Do not drive when taking Pain medications or sleeping medications (Benzodiazepines)  Do not take more than prescribed Pain, Sleep and Anxiety Medications. It is not advisable to combine anxiety,sleep and pain medications without talking with your primary care practitioner  Special Instructions: If you have smoked or chewed Tobacco  in the last 2 yrs please stop smoking, stop any regular Alcohol  and or any Recreational drug use.  Wear Seat belts while driving.  Do not drive if taking any narcotic, mind altering or controlled substances or recreational drugs or alcohol.       

## 2021-11-23 NOTE — Progress Notes (Deleted)
GI Office Note    Referring Provider: Benetta Spar* Primary Care Physician:  Benetta Spar, MD  Primary Gastroenterologist: Dr. Jena Gauss  Chief Complaint   No chief complaint on file.    History of Present Illness   Terrence Strickland is a 56 y.o. male presenting today at the request of [name], Tesfaye Demissie* for ***hospital follow up. Patient recently seen inpatient for hematemesis and melena.  Patient has history of alcohol abuse.  Presented to the ED 5/18 with hematemesis and melena for 10 days after being released from jail.  Stool was Hemoccult positive in the ED with stable hemoglobin.  Patient denied any reflux symptoms, lack of appetite, early satiety however he did admit to tobacco use, alcohol abuse, and daily NSAID consumption.  He denied any illicit drug use.  He also presented with hiccups and recent diarrhea.  He underwent EGD on 5/18 with severe ulcerative reflux esophagitis which was likely cause of GI bleeding hiccups, small hiatal hernia, patulous EG junction.  He was treated with PPI therapy and recommended Carafate 1 g 4 times daily for 5 days. He was also advised to consider outpatient colonoscopy for colorectal cancer screening.  Today: NSAIDs Alcohol Medication compliance -     Past Medical History:  Diagnosis Date   ADHD (attention deficit hyperactivity disorder)    Alcohol abuse    Depression    Hypertension    Irregular heart beat     Past Surgical History:  Procedure Laterality Date   ESOPHAGOGASTRODUODENOSCOPY (EGD) WITH PROPOFOL N/A 10/19/2021   Procedure: ESOPHAGOGASTRODUODENOSCOPY (EGD) WITH PROPOFOL;  Surgeon: Corbin Ade, MD;  Location: [facility];  Service: Endoscopy;  Laterality: N/A;    Current Outpatient Medications  Medication Sig Dispense Refill   amLODipine (NORVASC) 5 MG tablet Take 1 tablet (5 mg total) by mouth daily. 30 tablet 1   pantoprazole (PROTONIX) 40 MG tablet Take 1 tablet (40 mg total) by mouth 2  (two) times daily. 60 tablet 0   promethazine (PHENERGAN) 12.5 MG tablet Take 1 tablet (12.5 mg total) by mouth every 6 (six) hours as needed for nausea. 30 tablet 0   sucralfate (CARAFATE) 1 GM/10ML suspension Take 10 mLs (1 g total) by mouth 4 (four) times daily -  with meals and at bedtime. 1200 mL 0   No current facility-administered medications for this visit.    Allergies as of 11/27/2021 - Review Complete 10/26/2021  Allergen Reaction Noted   Nsaids Other (See Comments) 05/17/2013   Tramadol Other (See Comments) 03/26/2013    Family History  Problem Relation Age of Onset   Cancer Other    Diabetes Other     Social History   Socioeconomic History   Marital status: Married    Spouse name: Not on file   Number of children: Not on file   Years of education: Not on file   Highest education level: Not on file  Occupational History   Not on file  Tobacco Use   Smoking status: Every Day    Packs/day: 0.50    Years: 15.00    Total pack years: 7.50    Types: Cigarettes   Smokeless tobacco: Never  Substance and Sexual Activity   Alcohol use: Yes    Comment: 6pack beer a day   Drug use: No   Sexual activity: Never  Other Topics Concern   Not on file  Social History Narrative   Not on file   Social Determinants of Health  Financial Resource Strain: Not on file  Food Insecurity: Not on file  Transportation Needs: Not on file  Physical Activity: Not on file  Stress: Not on file  Social Connections: Not on file  Intimate Partner Violence: Not on file     Review of Systems   Gen: Denies any fever, chills, fatigue, weight loss, lack of appetite.  CV: Denies chest pain, heart palpitations, peripheral edema, syncope.  Resp: Denies shortness of breath at rest or with exertion. Denies wheezing or cough.  GI: Denies dysphagia or odynophagia. Denies jaundice, hematemesis, fecal incontinence. GU : Denies urinary burning, urinary frequency, urinary hesitancy MS: Denies  joint pain, muscle weakness, cramps, or limitation of movement.  Derm: Denies rash, itching, dry skin Psych: Denies depression, anxiety, memory loss, and confusion Heme: Denies bruising, bleeding, and enlarged lymph nodes.   Physical Exam   There were no vitals taken for this visit. General:   Alert and oriented. Pleasant and cooperative. Well-nourished and well-developed.  Head:  Normocephalic and atraumatic. Eyes:  Without icterus, sclera clear and conjunctiva pink.  Ears:  Normal auditory acuity. Mouth:  No deformity or lesions, oral mucosa pink.  Lungs:  Clear to auscultation bilaterally. No wheezes, rales, or rhonchi. No distress.  Heart:  S1, S2 present without murmurs appreciated.  Abdomen:  +BS, soft, non-tender and non-distended. No HSM noted. No guarding or rebound. No masses appreciated.  Rectal:  Deferred  Msk:  Symmetrical without gross deformities. Normal posture. Extremities:  Without edema. Neurologic:  Alert and  oriented x4;  grossly normal neurologically. Skin:  Intact without significant lesions or rashes. Psych:  Alert and cooperative. Normal mood and affect.   Assessment   Terrence Strickland is a 56 y.o. male with a history of depression, ADHD, tobacco use, and alcohol abuse presenting today for hospital follow up.   Reflux Esophagitis:  Screening for colon cancer:   PLAN   ***    Brooke Bonito, MSN, FNP-BC, AGACNP-BC [facility]

## 2021-11-27 ENCOUNTER — Ambulatory Visit: Payer: Medicaid Other | Admitting: Gastroenterology

## 2022-05-26 ENCOUNTER — Emergency Department (HOSPITAL_COMMUNITY): Payer: Medicaid Other

## 2022-05-26 ENCOUNTER — Encounter (HOSPITAL_COMMUNITY): Payer: Self-pay

## 2022-05-26 ENCOUNTER — Other Ambulatory Visit: Payer: Self-pay

## 2022-05-26 ENCOUNTER — Emergency Department (HOSPITAL_COMMUNITY)
Admission: EM | Admit: 2022-05-26 | Discharge: 2022-05-26 | Disposition: A | Discharge status: Home / Self Care | Payer: Medicaid Other | Attending: Emergency Medicine | Admitting: Emergency Medicine

## 2022-05-26 DIAGNOSIS — Z79899 Other long term (current) drug therapy: Secondary | ICD-10-CM | POA: Diagnosis not present

## 2022-05-26 DIAGNOSIS — R079 Chest pain, unspecified: Secondary | ICD-10-CM | POA: Insufficient documentation

## 2022-05-26 DIAGNOSIS — F10920 Alcohol use, unspecified with intoxication, uncomplicated: Secondary | ICD-10-CM

## 2022-05-26 LAB — COMPREHENSIVE METABOLIC PANEL
ALT: 17 U/L (ref 0–44)
AST: 26 U/L (ref 15–41)
Albumin: 3.8 g/dL (ref 3.5–5.0)
Alkaline Phosphatase: 60 U/L (ref 38–126)
Anion gap: 12 (ref 5–15)
BUN: 12 mg/dL (ref 6–20)
CO2: 22 mmol/L (ref 22–32)
Calcium: 9.1 mg/dL (ref 8.9–10.3)
Chloride: 108 mmol/L (ref 98–111)
Creatinine, Ser: 0.92 mg/dL (ref 0.61–1.24)
GFR, Estimated: >60 mL/min (ref >60)
Glucose, Bld: 94 mg/dL (ref 70–99)
Potassium: 3.8 mmol/L (ref 3.5–5.1)
Sodium: 142 mmol/L (ref 135–145)
Total Bilirubin: 0.3 mg/dL (ref 0.3–1.2)
Total Protein: 7.5 g/dL (ref 6.5–8.1)

## 2022-05-26 LAB — CBC WITH DIFFERENTIAL/PLATELET
Abs Immature Granulocytes: 0.01 10*3/uL (ref 0.00–0.07)
Basophils Absolute: 0.1 10*3/uL (ref 0.0–0.1)
Basophils Relative: 1 %
Eosinophils Absolute: 0.3 10*3/uL (ref 0.0–0.5)
Eosinophils Relative: 7 %
HCT: 43.2 % (ref 39.0–52.0)
Hemoglobin: 14.4 g/dL (ref 13.0–17.0)
Immature Granulocytes: 0 %
Lymphocytes Relative: 37 %
Lymphs Abs: 1.5 10*3/uL (ref 0.7–4.0)
MCH: 31.3 pg (ref 26.0–34.0)
MCHC: 33.3 g/dL (ref 30.0–36.0)
MCV: 93.9 fL (ref 80.0–100.0)
Monocytes Absolute: 0.7 10*3/uL (ref 0.1–1.0)
Monocytes Relative: 17 %
Neutro Abs: 1.6 10*3/uL — ABNORMAL LOW (ref 1.7–7.7)
Neutrophils Relative %: 38 %
Platelets: 241 10*3/uL (ref 150–400)
RBC: 4.6 MIL/uL (ref 4.22–5.81)
RDW: 14.5 % (ref 11.5–15.5)
WBC: 4.1 10*3/uL (ref 4.0–10.5)
nRBC: 0 % (ref 0.0–0.2)

## 2022-05-26 LAB — TROPONIN I (HIGH SENSITIVITY)
Troponin I (High Sensitivity): 4 ng/L (ref <18)
Troponin I (High Sensitivity): 4 ng/L (ref <18)

## 2022-05-26 LAB — ETHANOL: Alcohol, Ethyl (B): 102 mg/dL — ABNORMAL HIGH (ref <10)

## 2022-05-26 NOTE — ED Triage Notes (Signed)
Pt arrived from home via [facility] w c/o chest pain 7/10. EMS adm nitro sl x 1 dose and 324 mg asa. Pt states that he has been drinking alcohol this evening.

## 2022-05-26 NOTE — ED Notes (Signed)
Pt ambulated self to bathroom with out difficulty 

## 2022-05-26 NOTE — ED Notes (Signed)
Pt ambulated to restroom independently to have a BM. Pt provided crackers and cola.

## 2022-05-26 NOTE — Discharge Instructions (Addendum)
You were diagnosed with chest pain today.  This is a non-specific diagnosis, but your provider did not feel this was a life-threatening condition at this time.  Chest pain is a common presenting condition in the Emergency Department, and it is not uncommon for patients to leave without a specific diagnosis.  It is important to remember that your workup today was not a complete medical workup.  You may still have a serious medical attention that needs follow up care with a specialist. If your provider referred you to see a specialist, it is VERY important that you call to set up an appointment with them.    It is also important that you speak to your primary care provider in 1-2 days after this visit to the ER.  Your PCP may want to see you in the office, or else they may want you to follow up with the specialist.  SEEK IMMEDIATE MEDICAL ATTENTION IF:  You have severe chest pain, especially if the pain is crushing or pressure-like and spreads to the arms, back, neck, or jaw, or if you have sweating, nausea (feeling sick to your stomach), or shortness of breath. THIS IS AN EMERGENCY. Don't wait to see if the pain will go away. Get medical help at once. Call 911 or 0 (operator). DO NOT drive yourself to the hospital.   Your chest pain gets worse and does not go away with rest.  You have an attack of chest pain lasting longer than usual, despite rest and treatment with the medications your caregiver has prescribed.  You wake from sleep with chest pain or shortness of breath.  You feel dizzy or faint.  You have chest pain not typical of your usual pain for which you originally saw your caregiver.  

## 2022-05-26 NOTE — ED Provider Notes (Signed)
Here with alcohol intoxication, had reported chest pains earlier  Physical Exam  BP (!) 136/100   Pulse 87   Temp 97.9 F (36.6 C) (Oral)   Resp 20   SpO2 93%   Physical Exam  Procedures  Procedures  ED Course / MDM    Medical Decision Making Amount and/or Complexity of Data Reviewed Labs: ordered. Radiology: ordered.   Pending labs, reassessment after improved sobriety  *  Patient's labs reviewed.  Delta troponins are normal.  He has been here for 7 hours now and demonstrates clinical sobriety, was ambulating steadily to the bathroom, a juice and crackers.  His ethanol level was elevated, and I suspect that primarily his complaints today are driven by acute alcohol consumption.  Have a much lower suspicion for ACS, PE, or other life-threatening emergency.  He is stable for discharge at this time.  He can follow-up with his PCP.       Terald Sleeper, MD 05/26/22 1040

## 2022-05-26 NOTE — ED Provider Notes (Signed)
[facility] EMERGENCY DEPARTMENT Provider Note   CSN: [id number] Arrival date & time: 05/26/22  0346     History  Chief Complaint  Patient presents with   Chest Pain    Terrence Strickland is a 56 y.o. male.  56 year old male who presents the ER today secondary to chest pain.  This is per triage note.  When I evaluate the patient he has no complaints.  He states he has no issues right now.  I specifically asked about chest pain he says may be.  No shortness of breath.  No headache.  No fevers.  No recent illnesses.  Does endorse alcohol intake.   Chest Pain      Home Medications Prior to Admission medications   Medication Sig Start Date End Date Taking? Authorizing Provider  amLODipine (NORVASC) 5 MG tablet Take 1 tablet (5 mg total) by mouth daily. 10/28/21   Johnson, Clanford L, MD  pantoprazole (PROTONIX) 40 MG tablet Take 1 tablet (40 mg total) by mouth 2 (two) times daily. 10/20/21 11/19/21  Sherryll Burger, Pratik D, DO  promethazine (PHENERGAN) 12.5 MG tablet Take 1 tablet (12.5 mg total) by mouth every 6 (six) hours as needed for nausea. 10/20/21   Sherryll Burger, Pratik D, DO  sucralfate (CARAFATE) 1 GM/10ML suspension Take 10 mLs (1 g total) by mouth 4 (four) times daily -  with meals and at bedtime. 10/20/21 11/19/21  Maurilio Lovely D, DO      Allergies    Nsaids and Tramadol    Review of Systems   Review of Systems  Cardiovascular:  Positive for chest pain.    Physical Exam Updated Vital Signs BP (!) 136/100   Pulse 87   Temp 97.9 F (36.6 C) (Oral)   Resp 20   SpO2 93%  Physical Exam Vitals and nursing note reviewed.  Constitutional:      Appearance: He is well-developed.  HENT:     Head: Normocephalic and atraumatic.  Cardiovascular:     Rate and Rhythm: Normal rate.  Pulmonary:     Effort: Pulmonary effort is normal. No respiratory distress.  Chest:     Chest wall: No mass.  Abdominal:     General: There is no distension.     Palpations: Abdomen is soft.  Musculoskeletal:         General: Normal range of motion.     Cervical back: Normal range of motion.  Skin:    General: Skin is warm and dry.  Neurological:     Mental Status: He is alert.     ED Results / Procedures / Treatments   Labs (all labs ordered are listed, but only abnormal results are displayed) Labs Reviewed  CBC WITH DIFFERENTIAL/PLATELET  COMPREHENSIVE METABOLIC PANEL  TROPONIN I (HIGH SENSITIVITY)    EKG EKG Interpretation  Date/Time:  Saturday May 26 2022 04:24:21 EST Ventricular Rate:  89 PR Interval:  167 QRS Duration: 91 QT Interval:  379 QTC Calculation: 462 R Axis:   55 Text Interpretation: Sinus rhythm Probable left atrial enlargement Probable anteroseptal infarct, old Confirmed by Marily Memos [phone]) on 05/26/2022 7:08:42 AM  Radiology No results found.  Procedures Procedures    Medications Ordered in ED Medications - No data to display  ED Course/ Medical Decision Making/ A&P                           Medical Decision Making Amount and/or Complexity of Data Reviewed Labs: ordered.  Radiology: ordered.   Intoxicated? Will check etoh and uds.  Told triage he had chest pain, EMS same. Ecg reassuring, will check one troponin and basic labs with cxr, if normal, patinet can likely be d/c when clinically sober.   CXR done and showed increased pulm vasculature, otherwise unremarkable.  (independently viewed and interpreted by myself and radiology read reviewed).   Final Clinical Impression(s) / ED Diagnoses Final diagnoses:  None    Rx / DC Orders ED Discharge Orders     None         Morganne Haile, Barbara Cower, MD 05/26/22 2316

## 2022-12-08 ENCOUNTER — Encounter (HOSPITAL_COMMUNITY): Payer: Self-pay | Admitting: Emergency Medicine

## 2022-12-08 ENCOUNTER — Emergency Department (HOSPITAL_COMMUNITY)
Admission: EM | Admit: 2022-12-08 | Discharge: 2022-12-08 | Disposition: A | Discharge status: Home / Self Care | Payer: Medicaid Other | Source: Home / Self Care | Attending: Emergency Medicine | Admitting: Emergency Medicine

## 2022-12-08 ENCOUNTER — Other Ambulatory Visit: Payer: Self-pay

## 2022-12-08 ENCOUNTER — Emergency Department (HOSPITAL_COMMUNITY): Payer: Medicaid Other

## 2022-12-08 DIAGNOSIS — K2920 Alcoholic gastritis without bleeding: Secondary | ICD-10-CM | POA: Diagnosis not present

## 2022-12-08 DIAGNOSIS — R1010 Upper abdominal pain, unspecified: Secondary | ICD-10-CM | POA: Diagnosis present

## 2022-12-08 DIAGNOSIS — I10 Essential (primary) hypertension: Secondary | ICD-10-CM | POA: Diagnosis not present

## 2022-12-08 DIAGNOSIS — Z79899 Other long term (current) drug therapy: Secondary | ICD-10-CM | POA: Diagnosis not present

## 2022-12-08 LAB — URINALYSIS, ROUTINE W REFLEX MICROSCOPIC
Bacteria, UA: NONE SEEN
Bilirubin Urine: NEGATIVE
Glucose, UA: NEGATIVE mg/dL
Ketones, ur: 20 mg/dL — AB
Leukocytes,Ua: NEGATIVE
Nitrite: NEGATIVE
Protein, ur: 100 mg/dL — AB
Specific Gravity, Urine: 1.023 (ref 1.005–1.030)
pH: 5 (ref 5.0–8.0)

## 2022-12-08 LAB — CBC WITH DIFFERENTIAL/PLATELET
Abs Immature Granulocytes: 0.02 10*3/uL (ref 0.00–0.07)
Basophils Absolute: 0 10*3/uL (ref 0.0–0.1)
Basophils Relative: 0 %
Eosinophils Absolute: 0.1 10*3/uL (ref 0.0–0.5)
Eosinophils Relative: 2 %
HCT: 44.3 % (ref 39.0–52.0)
Hemoglobin: 15.3 g/dL (ref 13.0–17.0)
Immature Granulocytes: 0 %
Lymphocytes Relative: 16 %
Lymphs Abs: 1 10*3/uL (ref 0.7–4.0)
MCH: 32 pg (ref 26.0–34.0)
MCHC: 34.5 g/dL (ref 30.0–36.0)
MCV: 92.7 fL (ref 80.0–100.0)
Monocytes Absolute: 0.9 10*3/uL (ref 0.1–1.0)
Monocytes Relative: 14 %
Neutro Abs: 4.3 10*3/uL (ref 1.7–7.7)
Neutrophils Relative %: 68 %
Platelets: 212 10*3/uL (ref 150–400)
RBC: 4.78 MIL/uL (ref 4.22–5.81)
RDW: 14.3 % (ref 11.5–15.5)
WBC: 6.4 10*3/uL (ref 4.0–10.5)
nRBC: 0 % (ref 0.0–0.2)

## 2022-12-08 LAB — COMPREHENSIVE METABOLIC PANEL
ALT: 19 U/L (ref 0–44)
AST: 32 U/L (ref 15–41)
Albumin: 3.7 g/dL (ref 3.5–5.0)
Alkaline Phosphatase: 69 U/L (ref 38–126)
Anion gap: 10 (ref 5–15)
BUN: 13 mg/dL (ref 6–20)
CO2: 21 mmol/L — ABNORMAL LOW (ref 22–32)
Calcium: 8.8 mg/dL — ABNORMAL LOW (ref 8.9–10.3)
Chloride: 102 mmol/L (ref 98–111)
Creatinine, Ser: 0.98 mg/dL (ref 0.61–1.24)
GFR, Estimated: >60 mL/min (ref >60)
Glucose, Bld: 103 mg/dL — ABNORMAL HIGH (ref 70–99)
Potassium: 3.5 mmol/L (ref 3.5–5.1)
Sodium: 133 mmol/L — ABNORMAL LOW (ref 135–145)
Total Bilirubin: 0.8 mg/dL (ref 0.3–1.2)
Total Protein: 7.6 g/dL (ref 6.5–8.1)

## 2022-12-08 LAB — LIPASE, BLOOD: Lipase: 33 U/L (ref 11–51)

## 2022-12-08 LAB — ETHANOL: Alcohol, Ethyl (B): <10 mg/dL (ref <10)

## 2022-12-08 MED ORDER — IOHEXOL 300 MG/ML  SOLN
100.0000 mL | Freq: Once | INTRAMUSCULAR | Status: AC | PRN
  Administered 2022-12-08: 100 mL via INTRAVENOUS

## 2022-12-08 MED ORDER — ONDANSETRON HCL 4 MG PO TABS: 4.0000 mg | ORAL_TABLET | Freq: Four times a day (QID) | ORAL | 0 refills | Status: DC

## 2022-12-08 MED ORDER — MORPHINE SULFATE (PF) 4 MG/ML IV SOLN
4.0000 mg | Freq: Once | INTRAVENOUS | Status: AC
  Administered 2022-12-08: 4 mg via INTRAVENOUS
  Filled 2022-12-08: qty 1

## 2022-12-08 MED ORDER — ONDANSETRON HCL 4 MG/2ML IJ SOLN
4.0000 mg | Freq: Once | INTRAMUSCULAR | Status: AC
  Administered 2022-12-08: 4 mg via INTRAVENOUS
  Filled 2022-12-08: qty 2

## 2022-12-08 MED ORDER — AMLODIPINE BESYLATE 5 MG PO TABS
5.0000 mg | ORAL_TABLET | Freq: Once | ORAL | Status: AC
  Administered 2022-12-08: 5 mg via ORAL
  Filled 2022-12-08: qty 1

## 2022-12-08 MED ORDER — PANTOPRAZOLE SODIUM 40 MG PO TBEC: 40.0000 mg | DELAYED_RELEASE_TABLET | Freq: Every day | ORAL | 0 refills | Status: DC

## 2022-12-08 NOTE — ED Provider Notes (Signed)
[place] EMERGENCY DEPARTMENT AT [facility] Provider Note   CSN: [id number] Arrival date & time: 12/08/22  4098     History {Add pertinent medical, surgical, social history, OB history to HPI:1} Chief Complaint  Patient presents with   Abdominal Pain    Terrence Strickland is a 57 y.o. male.   Abdominal Pain Associated symptoms: diarrhea, nausea and vomiting   Associated symptoms: no chest pain, no dysuria, no fatigue, no fever and no shortness of breath        Terrence Strickland is a 57 y.o. male past medical history of hypertension, reflux esophagitis, alcohol use, who presents to the Emergency Department complaining of upper abdominal pain, diarrhea, nausea and vomiting.  Symptoms present x 2 days.  He endorses multiple episodes of watery diarrhea and 2-3 episodes of vomiting yesterday.  Describes sharp abdominal pain that radiates into his back.  States his symptoms began after eating fried apples and drinking beer.  He does endorse daily alcohol consumption.  Averages 4 to 612 ounce cans of beer per day.  States he has had some dark-colored stools.  No hematemesis.  Denies any fever or chills, does not take NSAIDs. Patient hypertensive on arrival.  Did not take his morning blood pressure medication.  He denies any chest pain or shortness of breath.   Home Medications Prior to Admission medications   Medication Sig Start Date End Date Taking? Authorizing Provider  amLODipine (NORVASC) 5 MG tablet Take 1 tablet (5 mg total) by mouth daily. 10/28/21   Johnson, Clanford L, MD  pantoprazole (PROTONIX) 40 MG tablet Take 1 tablet (40 mg total) by mouth 2 (two) times daily. 10/20/21 11/19/21  Sherryll Burger, Pratik D, DO  promethazine (PHENERGAN) 12.5 MG tablet Take 1 tablet (12.5 mg total) by mouth every 6 (six) hours as needed for nausea. 10/20/21   Sherryll Burger, Pratik D, DO  sucralfate (CARAFATE) 1 GM/10ML suspension Take 10 mLs (1 g total) by mouth 4 (four) times daily -  with meals and at bedtime.  10/20/21 11/19/21  Maurilio Lovely D, DO      Allergies    Nsaids and Tramadol    Review of Systems   Review of Systems  Constitutional:  Positive for appetite change. Negative for fatigue and fever.  Respiratory:  Negative for chest tightness and shortness of breath.   Cardiovascular:  Negative for chest pain.  Gastrointestinal:  Positive for abdominal pain, diarrhea, nausea and vomiting. Negative for abdominal distention and blood in stool.  Genitourinary:  Negative for dysuria and flank pain.  Musculoskeletal:  Positive for back pain. Negative for arthralgias.  Neurological:  Positive for weakness. Negative for dizziness, light-headedness and headaches.    Physical Exam Updated Vital Signs BP (!) 189/122 (BP Location: Left Leg)   Pulse (!) 55   Temp 98.9 F (37.2 C) (Oral)   Resp 16   Ht 5\' 11"  (1.803 m)   Wt 72.6 kg   SpO2 98%   BMI 22.32 kg/m  Physical Exam Vitals and nursing note reviewed.  Constitutional:      General: He is not in acute distress.    Appearance: He is well-developed. He is not ill-appearing or toxic-appearing.  HENT:     Mouth/Throat:     Mouth: Mucous membranes are dry.  Eyes:     General: No scleral icterus. Cardiovascular:     Rate and Rhythm: Normal rate and regular rhythm.     Pulses: Normal pulses.  Pulmonary:  Effort: Pulmonary effort is normal.  Abdominal:     General: There is no distension.     Palpations: Abdomen is soft.     Tenderness: There is no abdominal tenderness.     Comments: Tender to palpation epigastric left upper quadrant.  Abdomen soft.  No guarding or rebound tenderness.  Musculoskeletal:        General: Normal range of motion.  Skin:    General: Skin is warm.     Capillary Refill: Capillary refill takes less than 2 seconds.  Neurological:     General: No focal deficit present.     Mental Status: He is alert.     Sensory: No sensory deficit.     Motor: No weakness.     ED Results / Procedures / Treatments    Labs (all labs ordered are listed, but only abnormal results are displayed) Labs Reviewed - No data to display  EKG EKG Interpretation Date/Time:  Saturday December 08 2022 09:07:40 EDT Ventricular Rate:  57 PR Interval:  153 QRS Duration:  96 QT Interval:  475 QTC Calculation: 463 R Axis:   35  Text Interpretation: Sinus rhythm Left ventricular hypertrophy ST elevation suggests acute pericarditis increased changes in  ST segment. Confirmed by Benjiman Core [phone]) on 12/08/2022 9:12:28 AM  Radiology No results found.  Procedures Procedures  {Document cardiac monitor, telemetry assessment procedure when appropriate:1}  Medications Ordered in ED Medications  morphine (PF) 4 MG/ML injection 4 mg (has no administration in time range)  ondansetron (ZOFRAN) injection 4 mg (has no administration in time range)    ED Course/ Medical Decision Making/ A&P   {   Click here for ABCD2, HEART and other calculatorsREFRESH Note before signing :1}                          Medical Decision Making Patient here with complaint of abdominal pain that radiates into his back.  Nausea vomiting diarrhea with dark-colored stools.  Symptoms present x 2 days after eating fried apples and drinking beer.  He states he drinks beer daily averages 4-6 beers per day. No fever chest pain or shortness of breath.  Denies any recent antibiotic use.  Differential would include but not limited to peptic ulcer disease, esophagitis, pancreatitis, gastroenteritis, perforated viscus also considered but felt less likely given patient's mild symptoms.  Amount and/or Complexity of Data Reviewed Labs: ordered. Radiology: ordered.  Risk Prescription drug management.   ***  {Document critical care time when appropriate:1} {Document review of labs and clinical decision tools ie heart score, Chads2Vasc2 etc:1}  {Document your independent review of radiology images, and any outside records:1} {Document your  discussion with family members, caretakers, and with consultants:1} {Document social determinants of health affecting pt's care:1} {Document your decision making why or why not admission, treatments were needed:1} Final Clinical Impression(s) / ED Diagnoses Final diagnoses:  None    Rx / DC Orders ED Discharge Orders     None

## 2022-12-08 NOTE — ED Notes (Signed)
Patient asleep at this time, unable to obtain urine sample.

## 2022-12-08 NOTE — ED Triage Notes (Signed)
Pt arrived by [facility]. Pt states he has had diarrhea x2 days. 3 episodes of emesis yesterday, none today. Also c/o of lower abdominal pain. Pt hypertensive w/ EMS 188/130. Pt does not take any medicaitons. Pt BP 189/122 in triage.

## 2022-12-08 NOTE — Discharge Instructions (Signed)
Sips of clear fluids today, bland diet as tolerated.  Try to minimize your alcohol consumption.  Please follow-up with your primary care provider.  Also, your blood pressure today was elevated.  Be sure to take your blood pressure medication every day as directed.  Return to the emergency department for any new or worsening symptoms.

## 2022-12-08 NOTE — ED Notes (Signed)
PO challenge for patient for possible discharge.

## 2022-12-08 NOTE — ED Notes (Signed)
Notified provider of BP. Provider in route to see patient.

## 2023-01-22 IMAGING — DX DG LUMBAR SPINE COMPLETE 4+V
5 series · 5 of 5 positions shown · non-contrast
Comparison: 02/20/2018

CLINICAL DATA: Back pain, no known injury, initial encounter

EXAM:
LUMBAR SPINE - COMPLETE 4+ VIEW

[l-spine ap]
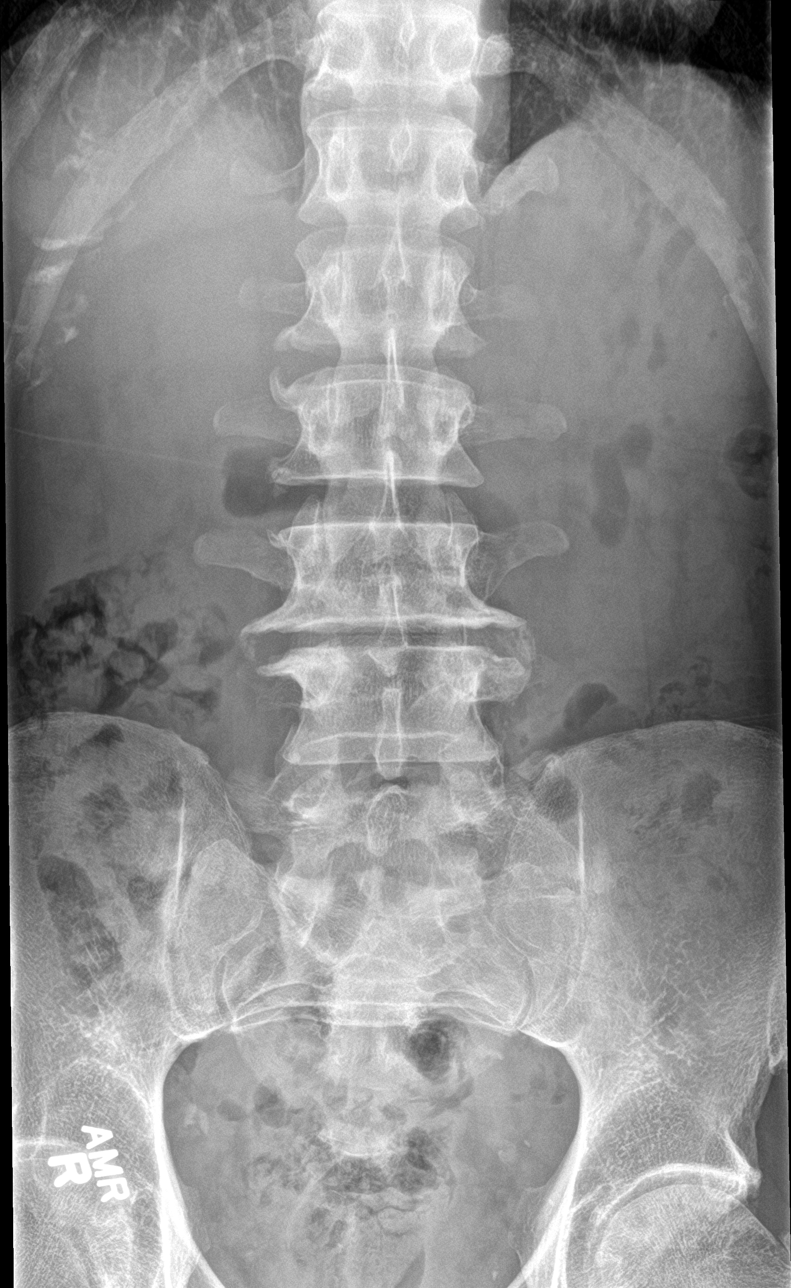

[l-spine obl (1 of 2)]
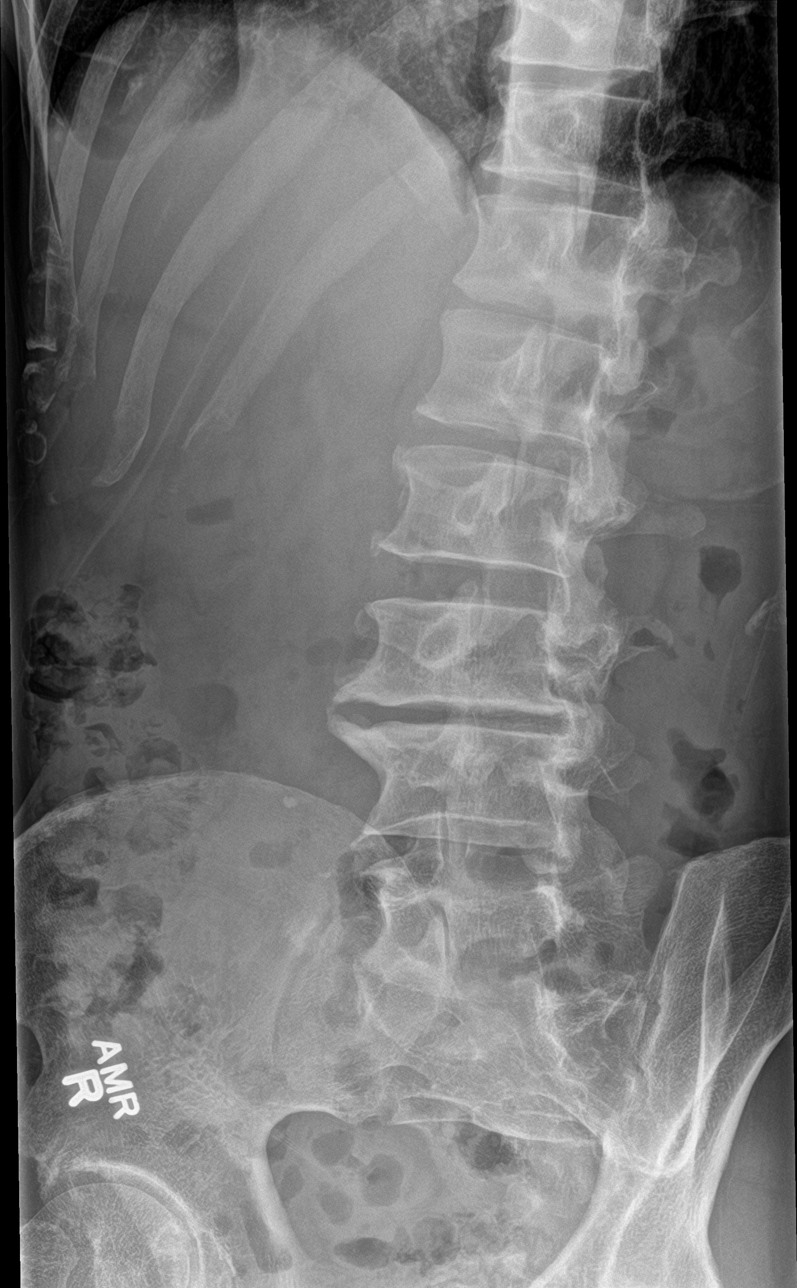

[l-spine obl (2 of 2)]
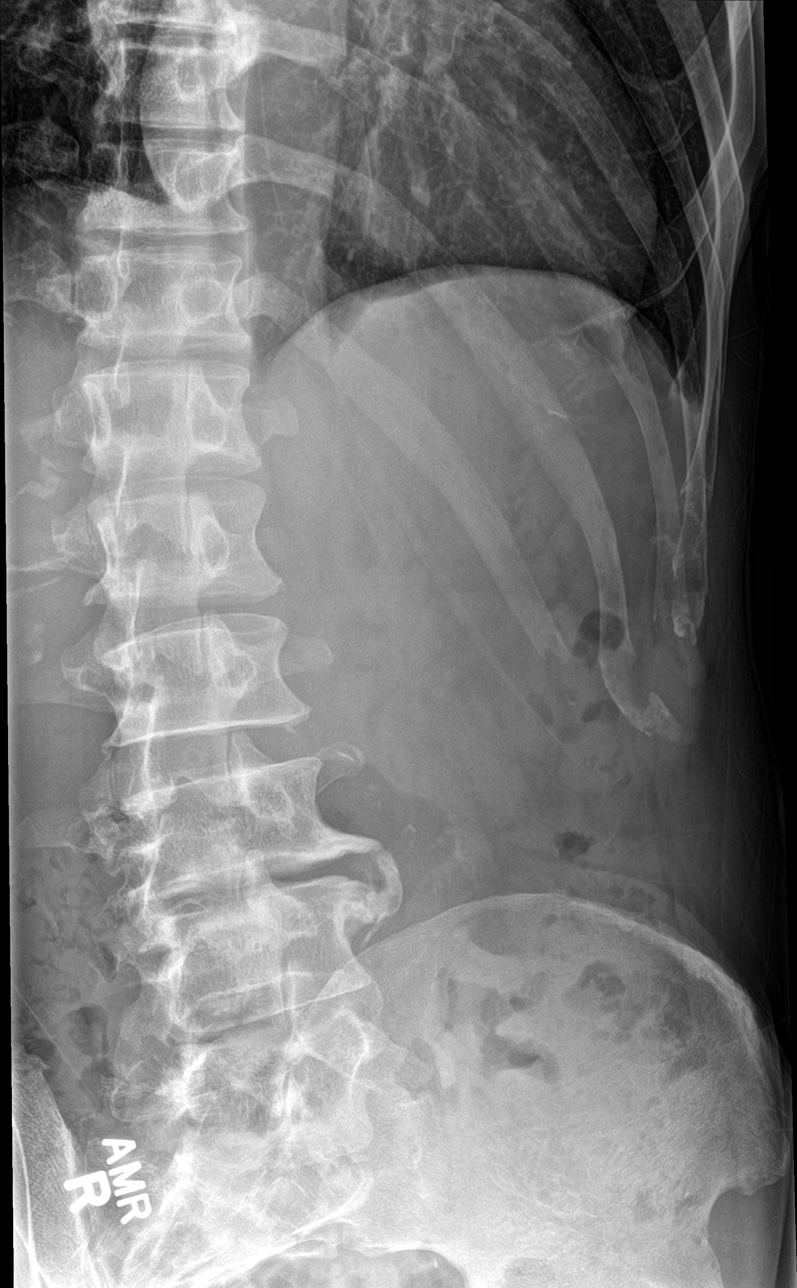

[l-spine lat]
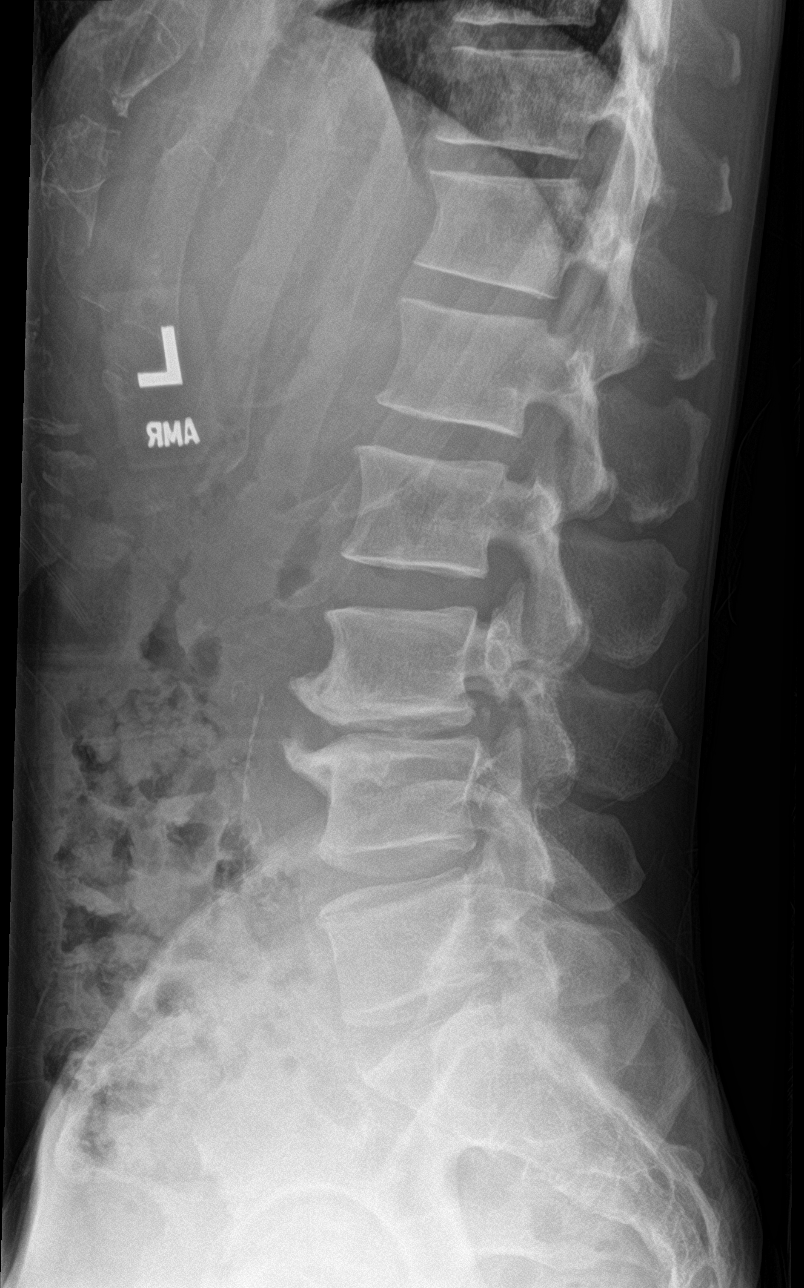

[l-spine spot]
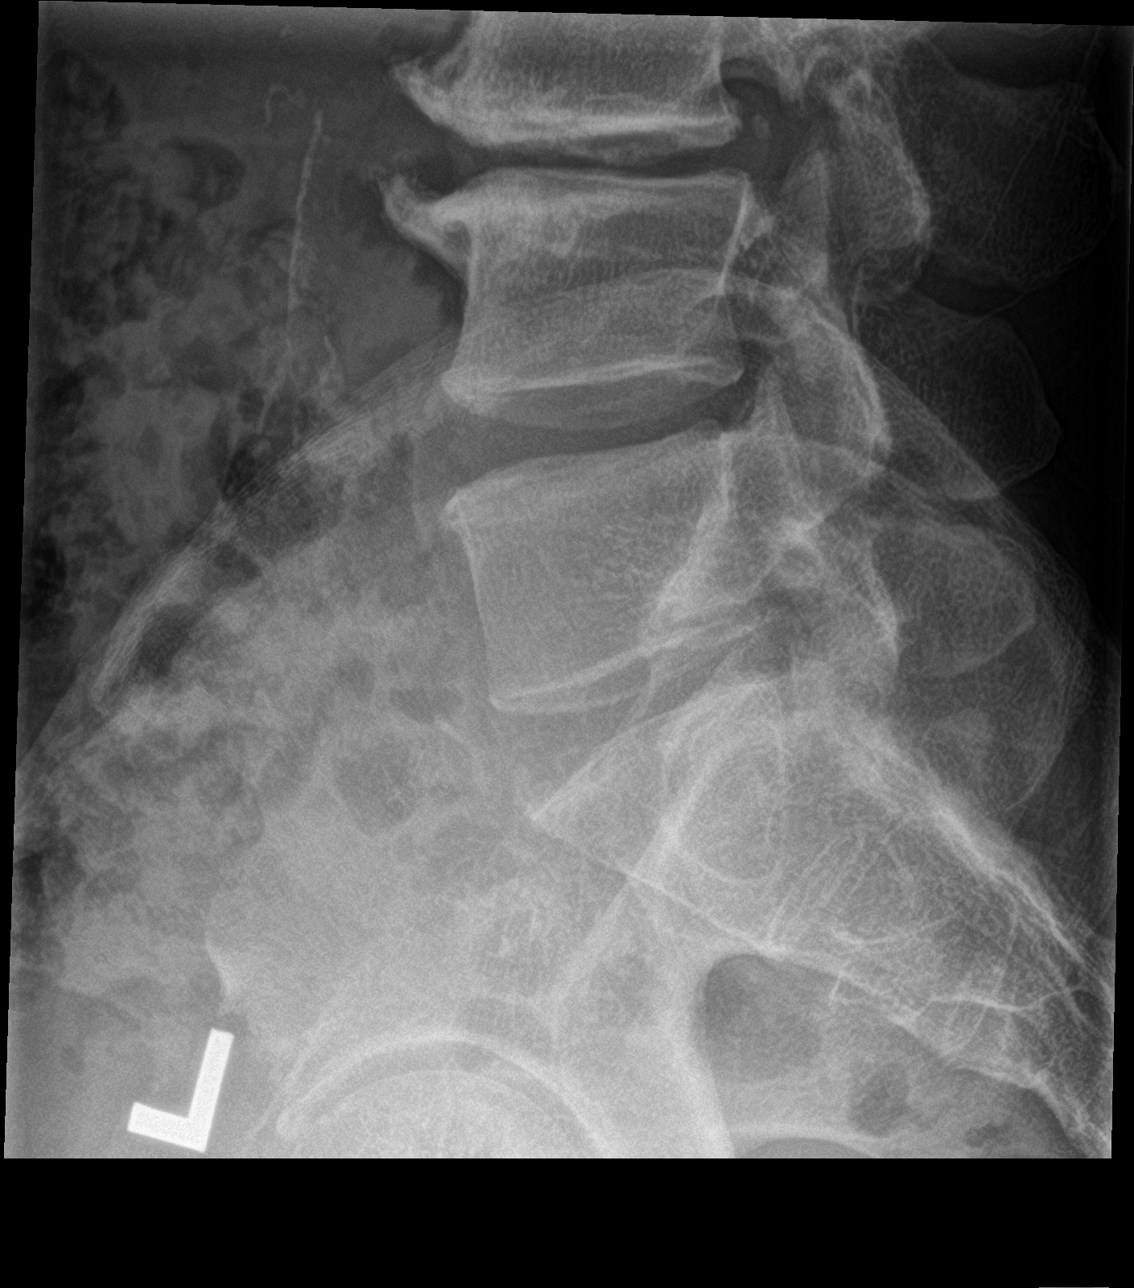

[5 of 5 positions shown; findings below may reference images not displayed]

FINDINGS: Transitional anatomy is again identified. Partial sacralization of
the 6 lumbar type vertebral body is noted. Osteophytic changes are
seen. No pars defects are noted. Disc space narrowing is seen at
L4-5. No anterolisthesis is noted. No soft tissue abnormality is
seen.
IMPRESSION: Stable degenerative change.

## 2023-05-16 NOTE — [facility]regational Nurse Program (Unsigned)
Terrence Strickland walked into the clinic to have his blood pressure checked. He mentioned that he has a primary care physician (PCP). The blood pressure readings were elevated in both arms: 188/115 in the left arm and 202/110 in the right arm. I recommended that he contact his PCP or go to the emergency department (ED). Terrence Strickland said he would call his doctor today.  Salem Senate RN MSN DNP [facility] Nurse

## 2023-05-21 ENCOUNTER — Other Ambulatory Visit: Payer: Self-pay

## 2023-05-21 ENCOUNTER — Encounter (HOSPITAL_COMMUNITY): Payer: Self-pay | Admitting: *Deleted

## 2023-05-21 ENCOUNTER — Emergency Department (HOSPITAL_COMMUNITY)
Admission: EM | Admit: 2023-05-21 | Discharge: 2023-05-21 | Disposition: A | Discharge status: Home / Self Care | Payer: Medicaid Other | Attending: Emergency Medicine | Admitting: Emergency Medicine

## 2023-05-21 DIAGNOSIS — I10 Essential (primary) hypertension: Secondary | ICD-10-CM | POA: Insufficient documentation

## 2023-05-21 DIAGNOSIS — R519 Headache, unspecified: Secondary | ICD-10-CM | POA: Insufficient documentation

## 2023-05-21 DIAGNOSIS — F17210 Nicotine dependence, cigarettes, uncomplicated: Secondary | ICD-10-CM | POA: Insufficient documentation

## 2023-05-21 DIAGNOSIS — Z5321 Procedure and treatment not carried out due to patient leaving prior to being seen by health care provider: Secondary | ICD-10-CM | POA: Insufficient documentation

## 2023-05-21 LAB — CBG MONITORING, ED: Glucose-Capillary: 115 mg/dL — ABNORMAL HIGH (ref 70–99)

## 2023-05-21 NOTE — ED Triage Notes (Signed)
Pt states "his brain hurts" and started x 5 weeks ago

## 2023-05-21 NOTE — ED Triage Notes (Signed)
Pt here with c/o high blood pressure. States he was seen earlier for same but "got tired of waiting".

## 2023-05-22 ENCOUNTER — Emergency Department (HOSPITAL_COMMUNITY): Payer: Medicaid Other

## 2023-05-22 ENCOUNTER — Emergency Department (HOSPITAL_COMMUNITY)
Admission: EM | Admit: 2023-05-22 | Discharge: 2023-05-22 | Disposition: A | Discharge status: Home / Self Care | Payer: Medicaid Other | Source: Home / Self Care | Attending: Emergency Medicine | Admitting: Emergency Medicine

## 2023-05-22 ENCOUNTER — Encounter (HOSPITAL_COMMUNITY): Payer: Self-pay | Admitting: Emergency Medicine

## 2023-05-22 DIAGNOSIS — I10 Essential (primary) hypertension: Secondary | ICD-10-CM

## 2023-05-22 DIAGNOSIS — R519 Headache, unspecified: Secondary | ICD-10-CM

## 2023-05-22 MED ORDER — HYDRALAZINE HCL 25 MG PO TABS
25.0000 mg | ORAL_TABLET | Freq: Once | ORAL | Status: AC
  Administered 2023-05-22: 25 mg via ORAL
  Filled 2023-05-22: qty 1

## 2023-05-22 MED ORDER — AMLODIPINE BESYLATE 5 MG PO TABS: 5.0000 mg | ORAL_TABLET | Freq: Every day | ORAL | 1 refills | Status: DC

## 2023-05-22 NOTE — Discharge Instructions (Signed)
You were evaluated in the Emergency Department and after careful evaluation, we did not find any emergent condition requiring admission or further testing in the hospital.  Your exam/testing today is overall reassuring.  Take the amlodipine blood pressure medication daily and follow-up with a primary care doctor.  Please return to the Emergency Department if you experience any worsening of your condition.   Thank you for allowing [place] to be a part of your care.

## 2023-05-22 NOTE — ED Notes (Signed)
Patient transported to CT 

## 2023-05-22 NOTE — ED Provider Notes (Signed)
[facility] Emergency Department Provider Note MRN:  [id number]  Arrival date & time: 05/22/23     Chief Complaint   Hypertension   History of Present Illness   Terrence Strickland is a 57 y.o. year-old male with a history of hypertension presenting to the ED with chief complaint of hypertension.  Headaches more frequently over the past few days.  Checked blood pressure and it was high.  Here for evaluation.  Denies numbness or weakness to the arms or legs, no issues with vision or speech, no chest pain or shortness of breath, no abdominal pain.  Review of Systems  A thorough review of systems was obtained and all systems are negative except as noted in the HPI and PMH.   Patient's Health History    Past Medical History:  Diagnosis Date   ADHD (attention deficit hyperactivity disorder)    Alcohol abuse    Depression    Hypertension    Irregular heart beat     Past Surgical History:  Procedure Laterality Date   ESOPHAGOGASTRODUODENOSCOPY (EGD) WITH PROPOFOL N/A 10/19/2021   Procedure: ESOPHAGOGASTRODUODENOSCOPY (EGD) WITH PROPOFOL;  Surgeon: Corbin Ade, MD;  Location: [facility] ENDO SUITE;  Service: Endoscopy;  Laterality: N/A;    Family History  Problem Relation Age of Onset   Cancer Other    Diabetes Other     Social History   Socioeconomic History   Marital status: Legally Separated    Spouse name: Not on file   Number of children: Not on file   Years of education: Not on file   Highest education level: Not on file  Occupational History   Not on file  Tobacco Use   Smoking status: Every Day    Current packs/day: 0.50    Average packs/day: 0.5 packs/day for 15.0 years (7.5 ttl pk-yrs)    Types: Cigarettes   Smokeless tobacco: Never  Substance and Sexual Activity   Alcohol use: Yes    Comment: 6pack beer a day   Drug use: No   Sexual activity: Never  Other Topics Concern   Not on file  Social History Narrative   Not on file   Social  Drivers of Health   Financial Resource Strain: Not on file  Food Insecurity: Not on file  Transportation Needs: Not on file  Physical Activity: Not on file  Stress: Not on file  Social Connections: Not on file  Intimate Partner Violence: Not on file     Physical Exam   Vitals:   05/21/23 2359  BP: (!) 175/132  Pulse: 87  Resp: 18  Temp: 98.5 F (36.9 C)  SpO2: 98%    CONSTITUTIONAL: Well-appearing, NAD NEURO/PSYCH:  Alert and oriented x 3, no focal deficits EYES:  eyes equal and reactive ENT/NECK:  no LAD, no JVD CARDIO: Regular rate, well-perfused, normal S1 and S2 PULM:  CTAB no wheezing or rhonchi GI/GU:  non-distended, non-tender MSK/SPINE:  No gross deformities, no edema SKIN:  no rash, atraumatic   *Additional and/or pertinent findings included in MDM below  Diagnostic and Interventional Summary    EKG Interpretation Date/Time:    Ventricular Rate:    PR Interval:    QRS Duration:    QT Interval:    QTC Calculation:   R Axis:      Text Interpretation:         Labs Reviewed - No data to display  CT HEAD WO CONTRAST ( )  Final Result  Medications  hydrALAZINE (APRESOLINE) tablet 25 mg (25 mg Oral Given 05/22/23 0053)     Procedures  /  Critical Care Procedures  ED Course and Medical Decision Making  Initial Impression and Ddx Headache with hypertension, hypertensive bleed considered but felt to be very unlikely given patient's reassuring neurological exam.  Otherwise no complaints.  Past medical/surgical history that increases complexity of ED encounter: Hypertension  Interpretation of Diagnostics CT head is without intracranial bleeding or abnormalities  Patient Reassessment and Ultimate Disposition/Management     Patient resting comfortably on reassessment wakes easily, appropriate for discharge with PCP follow-up.  Patient management required discussion with the following services or consulting groups:  None  Complexity of  Problems Addressed Acute illness or injury that poses threat of life of bodily function  Additional Data Reviewed and Analyzed Further history obtained from: Prior labs/imaging results  Additional Factors Impacting ED Encounter Risk Prescriptions  Elmer Sow. Pilar Plate, MD [facility] Emergency Medicine [facility] [email or web address]  Final Clinical Impressions(s) / ED Diagnoses     ICD-10-CM   1. Hypertension, unspecified type  I10     2. Nonintractable headache, unspecified chronicity pattern, unspecified headache type  R51.9       ED Discharge Orders          Ordered    amLODipine (NORVASC) 5 MG tablet  Daily        05/22/23 0204             Discharge Instructions Discussed with and Provided to Patient:    Discharge Instructions      You were evaluated in the Emergency Department and after careful evaluation, we did not find any emergent condition requiring admission or further testing in the hospital.  Your exam/testing today is overall reassuring.  Take the amlodipine blood pressure medication daily and follow-up with a primary care doctor.  Please return to the Emergency Department if you experience any worsening of your condition.   Thank you for allowing [place] to be a part of your care.      Sabas Sous, MD 05/22/23 [phone]

## 2023-06-02 ENCOUNTER — Emergency Department (HOSPITAL_COMMUNITY)
Admission: EM | Admit: 2023-06-02 | Discharge: 2023-06-02 | Disposition: A | Discharge status: Home / Self Care | Payer: Medicaid Other | Attending: Emergency Medicine | Admitting: Emergency Medicine

## 2023-06-02 ENCOUNTER — Other Ambulatory Visit: Payer: Self-pay

## 2023-06-02 ENCOUNTER — Encounter (HOSPITAL_COMMUNITY): Payer: Self-pay | Admitting: Emergency Medicine

## 2023-06-02 DIAGNOSIS — B351 Tinea unguium: Secondary | ICD-10-CM | POA: Diagnosis not present

## 2023-06-02 DIAGNOSIS — M79671 Pain in right foot: Secondary | ICD-10-CM | POA: Diagnosis not present

## 2023-06-02 DIAGNOSIS — I10 Essential (primary) hypertension: Secondary | ICD-10-CM | POA: Diagnosis not present

## 2023-06-02 DIAGNOSIS — M79672 Pain in left foot: Secondary | ICD-10-CM | POA: Diagnosis present

## 2023-06-02 DIAGNOSIS — Z79899 Other long term (current) drug therapy: Secondary | ICD-10-CM | POA: Insufficient documentation

## 2023-06-02 MED ORDER — ACETAMINOPHEN 325 MG PO TABS
650.0000 mg | ORAL_TABLET | Freq: Once | ORAL | Status: AC
  Administered 2023-06-02: 650 mg via ORAL
  Filled 2023-06-02: qty 2

## 2023-06-02 MED ORDER — DOXYCYCLINE HYCLATE 100 MG PO TABS
100.0000 mg | ORAL_TABLET | Freq: Once | ORAL | Status: AC
  Administered 2023-06-02: 100 mg via ORAL
  Filled 2023-06-02: qty 1

## 2023-06-02 MED ORDER — DOXYCYCLINE HYCLATE 100 MG PO CAPS: 100.0000 mg | ORAL_CAPSULE | Freq: Two times a day (BID) | ORAL | 0 refills | Status: DC

## 2023-06-02 NOTE — Discharge Instructions (Signed)
Soak your feet in warm water several times a day.  Take the antibiotic until it is completely gone.  Please make an appointment with the podiatrist (foot doctor).  You may take acetaminophen as needed for pain.

## 2023-06-02 NOTE — ED Provider Notes (Signed)
[facility] AT [facility] Provider Note   CSN: [id number] Arrival date & time: 06/02/23  0448     History  Chief Complaint  Patient presents with   Foot Pain    Terrence Strickland is a 57 y.o. male.  The history is provided by the patient.  Foot Pain  He has history of hypertension, alcohol abuse and comes in complaining of pain in the plantar surface of both feet.  He has a callus over the ball of the foot and that is where his pain is.  He has noted this for the last 2 weeks.  Nothing makes the pain better, nothing makes it worse.  He has been taking acetaminophen intermittently without relief.   Home Medications Prior to Admission medications   Medication Sig Start Date End Date Taking? Authorizing Provider  amLODipine (NORVASC) 5 MG tablet Take 1 tablet (5 mg total) by mouth daily. 05/22/23   Sabas Sous, MD  ondansetron (ZOFRAN) 4 MG tablet Take 1 tablet (4 mg total) by mouth every 6 (six) hours. As needed nausea vomiting 12/08/22   Triplett, Tammy, PA-C  pantoprazole (PROTONIX) 40 MG tablet Take 1 tablet (40 mg total) by mouth daily. 12/08/22   Triplett, Tammy, PA-C  promethazine (PHENERGAN) 12.5 MG tablet Take 1 tablet (12.5 mg total) by mouth every 6 (six) hours as needed for nausea. 10/20/21   Sherryll Burger, Pratik D, DO  sucralfate (CARAFATE) 1 GM/10ML suspension Take 10 mLs (1 g total) by mouth 4 (four) times daily -  with meals and at bedtime. 10/20/21 11/19/21  Maurilio Lovely D, DO      Allergies    Nsaids and Tramadol    Review of Systems   Review of Systems  All other systems reviewed and are negative.   Physical Exam Updated Vital Signs BP (!) 141/104 (BP Location: Left Arm)   Pulse 98   Temp 97.9 F (36.6 C) (Oral)   Resp 18   Ht 5\' 11"  (1.803 m)   Wt 68 kg   SpO2 95%   BMI 20.91 kg/m  Physical Exam Vitals and nursing note reviewed.   57 year old male, resting comfortably and in no acute distress. Vital signs are significant for  mildly elevated blood pressure. Oxygen saturation is 95%, which is normal. Head is normocephalic and atraumatic. PERRLA, EOMI. Oropharynx is clear. Lungs are clear without rales, wheezes, or rhonchi. Chest is nontender. Heart has regular rate and rhythm without murmur. Extremities: There is no cyanosis or edema.  Calluses are present over the plantar surface of both feet at the region of the first MTP joint.  This is tender.  There is no overlying erythema or fluctuance.  There is also extensive onychomycosis on all toes. Skin is warm and dry without other rash. Neurologic: Mental status is normal, moves all extremities equally.   ED Results / Procedures / Treatments    Procedures Procedures    Medications Ordered in ED Medications  doxycycline (VIBRA-TABS) tablet 100 mg (100 mg Oral Given 06/02/23 0529)  acetaminophen (TYLENOL) tablet 650 mg (650 mg Oral Given 06/02/23 0529)    ED Course/ Medical Decision Making/ A&P                                 Medical Decision Making  Painful calluses of both feet over the first MTP joints.  No evidence of cellulitis on exam.  He  is requesting I excised the calluses, I have advised him that that is not something I can do in emergency department.  He also has extensive onychomycosis, but I do not feel that that is contributing to his pain.  I have ordered a dose of doxycycline and I am discharging him with a prescription for doxycycline, advised him to soak in warm water and take acetaminophen as needed for pain.  Unfortunately he is intolerant of NSAIDs.  I am referring him to podiatry for more definitive care.  Final Clinical Impression(s) / ED Diagnoses Final diagnoses:  Bilateral foot pain  Onychomycosis    Rx / DC Orders ED Discharge Orders          Ordered    doxycycline (VIBRAMYCIN) 100 MG capsule  2 times daily        06/02/23 0522              Dione Booze, MD 06/02/23 [phone]

## 2023-06-02 NOTE — ED Triage Notes (Signed)
Pt with c/o bilateral foot pain. States he has calluses on his feet which are bothering him. Pt ambulatory to room.

## 2023-06-25 ENCOUNTER — Encounter (INDEPENDENT_AMBULATORY_CARE_PROVIDER_SITE_OTHER): Payer: Self-pay | Admitting: *Deleted

## 2023-06-25 ENCOUNTER — Ambulatory Visit (INDEPENDENT_AMBULATORY_CARE_PROVIDER_SITE_OTHER): Payer: Medicaid Other | Admitting: Internal Medicine

## 2023-06-25 ENCOUNTER — Encounter: Payer: Self-pay | Admitting: Internal Medicine

## 2023-06-25 VITALS — BP 156/104 | HR 59 | Ht 71.0 in | Wt 170.0 lb

## 2023-06-25 DIAGNOSIS — Z1329 Encounter for screening for other suspected endocrine disorder: Secondary | ICD-10-CM

## 2023-06-25 DIAGNOSIS — Z720 Tobacco use: Secondary | ICD-10-CM

## 2023-06-25 DIAGNOSIS — I10 Essential (primary) hypertension: Secondary | ICD-10-CM | POA: Diagnosis not present

## 2023-06-25 DIAGNOSIS — Z1321 Encounter for screening for nutritional disorder: Secondary | ICD-10-CM

## 2023-06-25 DIAGNOSIS — K2100 Gastro-esophageal reflux disease with esophagitis, without bleeding: Secondary | ICD-10-CM | POA: Diagnosis not present

## 2023-06-25 DIAGNOSIS — F1010 Alcohol abuse, uncomplicated: Secondary | ICD-10-CM | POA: Diagnosis not present

## 2023-06-25 DIAGNOSIS — Z1211 Encounter for screening for malignant neoplasm of colon: Secondary | ICD-10-CM

## 2023-06-25 DIAGNOSIS — L84 Corns and callosities: Secondary | ICD-10-CM

## 2023-06-25 DIAGNOSIS — Z131 Encounter for screening for diabetes mellitus: Secondary | ICD-10-CM

## 2023-06-25 DIAGNOSIS — Z13220 Encounter for screening for lipoid disorders: Secondary | ICD-10-CM

## 2023-06-25 DIAGNOSIS — Z1159 Encounter for screening for other viral diseases: Secondary | ICD-10-CM

## 2023-06-25 MED ORDER — AMLODIPINE BESYLATE 5 MG PO TABS: 5.0000 mg | ORAL_TABLET | Freq: Every day | ORAL | 3 refills | Status: DC

## 2023-06-25 MED ORDER — PANTOPRAZOLE SODIUM 40 MG PO TBEC: 40.0000 mg | DELAYED_RELEASE_TABLET | Freq: Every day | ORAL | 3 refills | Status: DC

## 2023-06-25 NOTE — Assessment & Plan Note (Signed)
Currently smokes 0.5 packs/day of cigarettes and has been smoking for at least 20 years.  He is precontemplative with regards to cessation. -The patient was counseled on the dangers of tobacco use, and was advised to quit.  Reviewed strategies to maximize success, including removing cigarettes and smoking materials from environment, stress management, substitution of other forms of reinforcement, support of family/friends, and written materials.

## 2023-06-25 NOTE — Assessment & Plan Note (Signed)
History of essential hypertension previously well-controlled with amlodipine 5 mg daily.  BP is elevated today.  He has been out of amlodipine recently. -Resume amlodipine 5 mg daily.  Follow-up in 4-6 weeks for BP check.

## 2023-06-25 NOTE — Assessment & Plan Note (Signed)
History of reflux esophagitis.  Previously prescribed pantoprazole 40 mg daily but has been off medication recently.  He endorses poorly controlled reflux symptoms currently. -Resume Protonix 40 mg daily -Follow-up in 4-6 weeks for reassessment

## 2023-06-25 NOTE — Assessment & Plan Note (Signed)
Gastroenterology referral placed today 

## 2023-06-25 NOTE — Assessment & Plan Note (Signed)
There is a tender lesion along the plantar aspect of the right foot that seems most consistent with a corn.  He has tried multiple over-the-counter treatments as well as debriding it with a razor at home but the lesion recurs. -Podiatry referral placed today

## 2023-06-25 NOTE — Progress Notes (Signed)
New Patient Office Visit  Subjective    Patient ID: Terrence Strickland, male    DOB: February 01, 1966  Age: 58 y.o. MRN: [id number]  CC:  Chief Complaint  Patient presents with   Establish Care    HPI Terrence Strickland presents to establish care.  He is a 58 year old male who endorses a past medical history significant for hypertension and GERD.  Previously followed by Dr. Felecia Shelling but has not been seen in multiple years.  Mr. Terrence Strickland reports feeling well today.  He endorses reflux symptoms but is otherwise asymptomatic and has no acute concerns to discuss aside from desiring to establish care.  He is currently unemployed.  He smokes 0.5 packs/day of cigarettes and has been smoking for at least 20 years.  He endorses daily alcohol consumption, drinking 4 cans of beer daily.  Denies illicit drug use.  Family medical history is significant for diabetes mellitus.  Chronic medical conditions and outstanding preventative care items discussed today are individually addressed in A/P below.  Outpatient Encounter Medications as of 06/25/2023  Medication Sig   [DISCONTINUED] amLODipine (NORVASC) 5 MG tablet Take 1 tablet (5 mg total) by mouth daily.   amLODipine (NORVASC) 5 MG tablet Take 1 tablet (5 mg total) by mouth daily.   pantoprazole (PROTONIX) 40 MG tablet Take 1 tablet (40 mg total) by mouth daily.   [DISCONTINUED] doxycycline (VIBRAMYCIN) 100 MG capsule Take 1 capsule (100 mg total) by mouth 2 (two) times daily. (Patient not taking: Reported on 06/25/2023)   [DISCONTINUED] ondansetron (ZOFRAN) 4 MG tablet Take 1 tablet (4 mg total) by mouth every 6 (six) hours. As needed nausea vomiting (Patient not taking: Reported on 06/25/2023)   [DISCONTINUED] pantoprazole (PROTONIX) 40 MG tablet Take 1 tablet (40 mg total) by mouth daily. (Patient not taking: Reported on 06/25/2023)   [DISCONTINUED] promethazine (PHENERGAN) 12.5 MG tablet Take 1 tablet (12.5 mg total) by mouth every 6 (six) hours as needed for nausea. (Patient  not taking: Reported on 06/25/2023)   [DISCONTINUED] sucralfate (CARAFATE) 1 GM/10ML suspension Take 10 mLs (1 g total) by mouth 4 (four) times daily -  with meals and at bedtime.   No facility-administered encounter medications on file as of 06/25/2023.    Past Medical History:  Diagnosis Date   ADHD (attention deficit hyperactivity disorder)    Alcohol abuse    Depression    Hypertension    Irregular heart beat     Past Surgical History:  Procedure Laterality Date   ESOPHAGOGASTRODUODENOSCOPY (EGD) WITH PROPOFOL N/A 10/19/2021   Procedure: ESOPHAGOGASTRODUODENOSCOPY (EGD) WITH PROPOFOL;  Surgeon: Corbin Ade, MD;  Location: [facility];  Service: Endoscopy;  Laterality: N/A;    Family History  Problem Relation Age of Onset   Cancer Other    Diabetes Other     Social History   Socioeconomic History   Marital status: Legally Separated    Spouse name: Not on file   Number of children: Not on file   Years of education: Not on file   Highest education level: Not on file  Occupational History   Not on file  Tobacco Use   Smoking status: Every Day    Current packs/day: 0.50    Average packs/day: 0.5 packs/day for 15.0 years (7.5 ttl pk-yrs)    Types: Cigarettes   Smokeless tobacco: Never  Substance and Sexual Activity   Alcohol use: Yes    Comment: 6pack beer a day   Drug use: No   Sexual  activity: Never  Other Topics Concern   Not on file  Social History Narrative   Not on file   Social Drivers of Health   Financial Resource Strain: Not on file  Food Insecurity: Not on file  Transportation Needs: Not on file  Physical Activity: Not on file  Stress: Not on file  Social Connections: Not on file  Intimate Partner Violence: Not on file   Review of Systems  Constitutional:  Negative for chills and fever.  HENT:  Negative for sore throat.   Respiratory:  Negative for cough and shortness of breath.   Cardiovascular:  Negative for chest pain, palpitations and  leg swelling.  Gastrointestinal:  Positive for heartburn. Negative for abdominal pain, blood in stool, constipation, diarrhea, nausea and vomiting.  Genitourinary:  Negative for dysuria and hematuria.  Musculoskeletal:  Negative for myalgias.  Skin:  Negative for itching and rash.  Neurological:  Negative for dizziness and headaches.  Psychiatric/Behavioral:  Negative for depression and suicidal ideas.     Objective    BP (!) 156/104 (BP Location: Left Arm, Patient Position: Sitting, Cuff Size: Normal)   Pulse (!) 59   Ht 5\' 11"  (1.803 m)   Wt 170 lb (77.1 kg)   SpO2 95%   BMI 23.71 kg/m   Physical Exam Vitals reviewed.  Constitutional:      General: He is not in acute distress.    Appearance: Normal appearance. He is not ill-appearing.  HENT:     Head: Normocephalic and atraumatic.     Right Ear: External ear normal.     Left Ear: External ear normal.     Nose: Nose normal. No congestion or rhinorrhea.     Mouth/Throat:     Mouth: Mucous membranes are moist.     Pharynx: Oropharynx is clear.  Eyes:     General: No scleral icterus.    Extraocular Movements: Extraocular movements intact.     Conjunctiva/sclera: Conjunctivae normal.     Pupils: Pupils are equal, round, and reactive to light.  Cardiovascular:     Rate and Rhythm: Normal rate and regular rhythm.     Pulses: Normal pulses.     Heart sounds: Normal heart sounds. No murmur heard. Pulmonary:     Effort: Pulmonary effort is normal.     Breath sounds: Normal breath sounds. No wheezing, rhonchi or rales.  Abdominal:     General: Abdomen is flat. Bowel sounds are normal. There is no distension.     Palpations: Abdomen is soft.     Tenderness: There is no abdominal tenderness.  Musculoskeletal:        General: No swelling or deformity. Normal range of motion.     Cervical back: Normal range of motion.  Skin:    General: Skin is warm and dry.     Capillary Refill: Capillary refill takes less than 2 seconds.   Neurological:     General: No focal deficit present.     Mental Status: He is alert and oriented to person, place, and time.     Motor: No weakness.  Psychiatric:        Mood and Affect: Mood normal.        Behavior: Behavior normal.        Thought Content: Thought content normal.    Assessment & Plan:   Problem List Items Addressed This Visit       Essential hypertension - Primary (Chronic)   History of essential hypertension previously well-controlled with amlodipine  5 mg daily.  BP is elevated today.  He has been out of amlodipine recently. -Resume amlodipine 5 mg daily.  Follow-up in 4-6 weeks for BP check.      Reflux esophagitis (Chronic)   History of reflux esophagitis.  Previously prescribed pantoprazole 40 mg daily but has been off medication recently.  He endorses poorly controlled reflux symptoms currently. -Resume Protonix 40 mg daily -Follow-up in 4-6 weeks for reassessment      Corn of foot   There is a tender lesion along the plantar aspect of the right foot that seems most consistent with a corn.  He has tried multiple over-the-counter treatments as well as debriding it with a razor at home but the lesion recurs. -Podiatry referral placed today      Tobacco abuse (Chronic)   Currently smokes 0.5 packs/day of cigarettes and has been smoking for at least 20 years.  He is precontemplative with regards to cessation. -The patient was counseled on the dangers of tobacco use, and was advised to quit.  Reviewed strategies to maximize success, including removing cigarettes and smoking materials from environment, stress management, substitution of other forms of reinforcement, support of family/friends, and written materials.       Alcohol abuse   Currently drinks 4 cans of beer daily.  Not interested in quitting.  Counseled on alcohol cessation.      Colon cancer screening   Gastroenterology referral placed today      Return in about 4 weeks (around 07/23/2023)  for HTN, lab review.   Billie Lade, MD

## 2023-06-25 NOTE — Assessment & Plan Note (Signed)
Currently drinks 4 cans of beer daily.  Not interested in quitting.  Counseled on alcohol cessation.

## 2023-06-25 NOTE — Patient Instructions (Signed)
It was a pleasure to see you today.  Thank you for giving [place] the opportunity to be involved in your care.  Below is a brief recap of your visit and next steps.  We will plan to see you again in 4-6 weeks.  Summary You have established care today We will check basic labs Referrals placed to podiatry and gastroenterology Resume amlodipine 5 mg daily and protonix 40 mg daily Follow up in 4-6 weeks

## 2023-06-26 ENCOUNTER — Encounter: Payer: Self-pay | Admitting: Internal Medicine

## 2023-06-26 ENCOUNTER — Other Ambulatory Visit: Payer: Self-pay | Admitting: Internal Medicine

## 2023-06-26 DIAGNOSIS — E559 Vitamin D deficiency, unspecified: Secondary | ICD-10-CM

## 2023-06-26 LAB — TSH+FREE T4
Free T4: 0.98 ng/dL (ref 0.82–1.77)
TSH: 0.459 u[IU]/mL (ref 0.450–4.500)

## 2023-06-26 LAB — CBC WITH DIFFERENTIAL/PLATELET
Basophils Absolute: 0 10*3/uL (ref 0.0–0.2)
Basos: 1 %
EOS (ABSOLUTE): 0.3 10*3/uL (ref 0.0–0.4)
Eos: 6 %
Hematocrit: 41.1 % (ref 37.5–51.0)
Hemoglobin: 13.7 g/dL (ref 13.0–17.7)
Immature Grans (Abs): 0 10*3/uL (ref 0.0–0.1)
Immature Granulocytes: 0 %
Lymphocytes Absolute: 1.9 10*3/uL (ref 0.7–3.1)
Lymphs: 43 %
MCH: 31.9 pg (ref 26.6–33.0)
MCHC: 33.3 g/dL (ref 31.5–35.7)
MCV: 96 fL (ref 79–97)
Monocytes Absolute: 0.6 10*3/uL (ref 0.1–0.9)
Monocytes: 13 %
Neutrophils Absolute: 1.6 10*3/uL (ref 1.4–7.0)
Neutrophils: 37 %
Platelets: 273 10*3/uL (ref 150–450)
RBC: 4.3 x10E6/uL (ref 4.14–5.80)
RDW: 12.4 % (ref 11.6–15.4)
WBC: 4.4 10*3/uL (ref 3.4–10.8)

## 2023-06-26 LAB — CMP14+EGFR
ALT: 15 [IU]/L (ref 0–44)
AST: 26 [IU]/L (ref 0–40)
Albumin: 4.2 g/dL (ref 3.8–4.9)
Alkaline Phosphatase: 68 [IU]/L (ref 44–121)
BUN/Creatinine Ratio: 9 (ref 9–20)
BUN: 10 mg/dL (ref 6–24)
Bilirubin Total: 0.2 mg/dL (ref 0.0–1.2)
CO2: 24 mmol/L (ref 20–29)
Calcium: 9.4 mg/dL (ref 8.7–10.2)
Chloride: 103 mmol/L (ref 96–106)
Creatinine, Ser: 1.06 mg/dL (ref 0.76–1.27)
Globulin, Total: 2.9 g/dL (ref 1.5–4.5)
Glucose: 82 mg/dL (ref 70–99)
Potassium: 4 mmol/L (ref 3.5–5.2)
Sodium: 140 mmol/L (ref 134–144)
Total Protein: 7.1 g/dL (ref 6.0–8.5)
eGFR: 82 mL/min/{1.73_m2} (ref >59)

## 2023-06-26 LAB — HEMOGLOBIN A1C
Est. average glucose Bld gHb Est-mCnc: 105 mg/dL
Hgb A1c MFr Bld: 5.3 % (ref 4.8–5.6)

## 2023-06-26 LAB — LIPID PANEL
Chol/HDL Ratio: 2.6 {ratio} (ref 0.0–5.0)
Cholesterol, Total: 152 mg/dL (ref 100–199)
HDL: 58 mg/dL (ref >39)
LDL Chol Calc (NIH): 58 mg/dL (ref 0–99)
Triglycerides: 229 mg/dL — ABNORMAL HIGH (ref 0–149)
VLDL Cholesterol Cal: 36 mg/dL (ref 5–40)

## 2023-06-26 LAB — VITAMIN D 25 HYDROXY (VIT D DEFICIENCY, FRACTURES): Vit D, 25-Hydroxy: 16.7 ng/mL — ABNORMAL LOW (ref 30.0–100.0)

## 2023-06-26 LAB — B12 AND FOLATE PANEL
Folate: 10.7 ng/mL (ref >3.0)
Vitamin B-12: 457 pg/mL (ref 232–1245)

## 2023-06-26 LAB — HCV AB W REFLEX TO QUANT PCR: HCV Ab: NONREACTIVE

## 2023-06-26 LAB — HCV INTERPRETATION

## 2023-06-26 MED ORDER — VITAMIN D (ERGOCALCIFEROL) 1.25 MG (50000 UNIT) PO CAPS: 50000.0000 [IU] | ORAL_CAPSULE | ORAL | 0 refills | Status: DC

## 2023-07-05 ENCOUNTER — Ambulatory Visit: Payer: Medicaid Other | Admitting: Podiatry

## 2023-07-05 ENCOUNTER — Encounter: Payer: Self-pay | Admitting: Podiatry

## 2023-07-05 DIAGNOSIS — D492 Neoplasm of unspecified behavior of bone, soft tissue, and skin: Secondary | ICD-10-CM | POA: Diagnosis not present

## 2023-07-05 DIAGNOSIS — M79675 Pain in left toe(s): Secondary | ICD-10-CM

## 2023-07-05 DIAGNOSIS — B351 Tinea unguium: Secondary | ICD-10-CM

## 2023-07-05 DIAGNOSIS — M79674 Pain in right toe(s): Secondary | ICD-10-CM | POA: Diagnosis not present

## 2023-07-05 MED ORDER — CICLOPIROX 8 % EX SOLN: Freq: Every day | CUTANEOUS | 2 refills | Status: AC

## 2023-07-05 MED ORDER — AMMONIUM LACTATE 12 % EX CREA
1.0000 | TOPICAL_CREAM | CUTANEOUS | 0 refills | Status: AC | PRN
Start: 2023-07-05 — End: ?

## 2023-07-05 NOTE — Progress Notes (Signed)
Subjective:   Patient ID: Terrence Strickland, male   DOB: 58 y.o.   MRN: [id number]   HPI Chief Complaint  Patient presents with   Callouses    RM#11 Calluses on bottom of both feet causing pain needs nail trim questions of fungus to his feet.   58 year old male presents the office with above concerns.  She gets a callus symptoms to be super cause discomfort.  He also has thick, discolored toenails is not able to trim himself he has fungus.  He has not had any recent treatment.  This been getting worse over the last 1 year.  No open lesions.  No injuries.  No other concerns.    Review of Systems  All other systems reviewed and are negative.  Past Medical History:  Diagnosis Date   ADHD (attention deficit hyperactivity disorder)    Alcohol abuse    Depression    Hypertension    Irregular heart beat     Past Surgical History:  Procedure Laterality Date   ESOPHAGOGASTRODUODENOSCOPY (EGD) WITH PROPOFOL N/A 10/19/2021   Procedure: ESOPHAGOGASTRODUODENOSCOPY (EGD) WITH PROPOFOL;  Surgeon: Corbin Ade, MD;  Location: [facility];  Service: Endoscopy;  Laterality: N/A;     Current Outpatient Medications:    amLODipine (NORVASC) 5 MG tablet, Take 1 tablet (5 mg total) by mouth daily., Disp: 90 tablet, Rfl: 3   ammonium lactate (AMLACTIN) 12 % cream, Apply 1 Application topically as needed for dry skin., Disp: 385 g, Rfl: 0   ciclopirox (PENLAC) 8 % solution, Apply topically at bedtime. Apply over nail and surrounding skin. Apply daily over previous coat. After seven (7) days, may remove with alcohol and continue cycle., Disp: 6.6 mL, Rfl: 2   pantoprazole (PROTONIX) 40 MG tablet, Take 1 tablet (40 mg total) by mouth daily., Disp: 90 tablet, Rfl: 3   Vitamin D, Ergocalciferol, (DRISDOL) 1.25 MG (50000 UNIT) CAPS capsule, Take 1 capsule (50,000 Units total) by mouth every 7 (seven) days., Disp: 12 capsule, Rfl: 0  Allergies  Allergen Reactions   Nsaids Other (See Comments)     hallucinations   Tramadol Other (See Comments)    Chills, hallucinations, sweating             Objective:  Physical Exam  General: AAO x3, NAD  Dermatological: Hyperkeratotic lesion bilateral submetatarsal x 1 on each foot.  There is no underlying ulceration, drainage or signs of infection.  The nails are all hypertrophic, dystrophic with yellow discoloration and subungual debris is present.  There is no edema, erythema or signs of infection.  No open lesions.  Vascular: Dorsalis Pedis artery and Posterior Tibial artery pedal pulses are 2/4 bilateral with immedate capillary fill time.  There is no pain with calf compression, swelling, warmth, erythema.   Neruologic: Grossly intact via light touch bilateral  Musculoskeletal: No other areas of discomfort.  Probably metatarsal heads.      Assessment:   58 year old male with symptomatic onychosis, hyperkeratotic lesions     Plan:  -Treatment options discussed including all alternatives, risks, and complications -Etiology of symptoms were discussed -Sharply debrided nails x 10 without any complications or bleeding.  Prescribed Penlac. -As a courtesy debride the calluses or any complications.  Minimal bleeding the left foot.  Antibiotic ointment applied followed by dressing.  Moderate signs or symptoms of infection or irritation let me know immediately should any occur.  I dispensed offloading pads.  Moisturizer daily.  Prescribed AmLactin.    Vivi Barrack DPM

## 2023-07-08 ENCOUNTER — Emergency Department (HOSPITAL_COMMUNITY): Payer: Medicaid Other

## 2023-07-08 ENCOUNTER — Emergency Department (HOSPITAL_COMMUNITY)
Admission: EM | Admit: 2023-07-08 | Discharge: 2023-07-09 | Disposition: A | Discharge status: Home / Self Care | Payer: Medicaid Other | Attending: Emergency Medicine | Admitting: Emergency Medicine

## 2023-07-08 ENCOUNTER — Other Ambulatory Visit: Payer: Self-pay

## 2023-07-08 ENCOUNTER — Encounter (HOSPITAL_COMMUNITY): Payer: Self-pay | Admitting: *Deleted

## 2023-07-08 DIAGNOSIS — Y906 Blood alcohol level of 120-199 mg/100 ml: Secondary | ICD-10-CM | POA: Diagnosis not present

## 2023-07-08 DIAGNOSIS — Z79899 Other long term (current) drug therapy: Secondary | ICD-10-CM | POA: Diagnosis not present

## 2023-07-08 DIAGNOSIS — I10 Essential (primary) hypertension: Secondary | ICD-10-CM | POA: Insufficient documentation

## 2023-07-08 DIAGNOSIS — F10920 Alcohol use, unspecified with intoxication, uncomplicated: Secondary | ICD-10-CM | POA: Insufficient documentation

## 2023-07-08 DIAGNOSIS — R079 Chest pain, unspecified: Secondary | ICD-10-CM | POA: Diagnosis present

## 2023-07-08 DIAGNOSIS — R0789 Other chest pain: Secondary | ICD-10-CM

## 2023-07-08 NOTE — ED Triage Notes (Signed)
Pt c/o headache with high blood pressure for the last couple of days

## 2023-07-09 LAB — BASIC METABOLIC PANEL
Anion gap: 13 (ref 5–15)
BUN: 10 mg/dL (ref 6–20)
CO2: 22 mmol/L (ref 22–32)
Calcium: 9 mg/dL (ref 8.9–10.3)
Chloride: 103 mmol/L (ref 98–111)
Creatinine, Ser: 0.97 mg/dL (ref 0.61–1.24)
GFR, Estimated: >60 mL/min (ref >60)
Glucose, Bld: 142 mg/dL — ABNORMAL HIGH (ref 70–99)
Potassium: 3.1 mmol/L — ABNORMAL LOW (ref 3.5–5.1)
Sodium: 138 mmol/L (ref 135–145)

## 2023-07-09 LAB — CBC
HCT: 42.6 % (ref 39.0–52.0)
Hemoglobin: 14.2 g/dL (ref 13.0–17.0)
MCH: 31.1 pg (ref 26.0–34.0)
MCHC: 33.3 g/dL (ref 30.0–36.0)
MCV: 93.4 fL (ref 80.0–100.0)
Platelets: 255 10*3/uL (ref 150–400)
RBC: 4.56 MIL/uL (ref 4.22–5.81)
RDW: 14.2 % (ref 11.5–15.5)
WBC: 5.4 10*3/uL (ref 4.0–10.5)
nRBC: 0 % (ref 0.0–0.2)

## 2023-07-09 LAB — ETHANOL: Alcohol, Ethyl (B): 160 mg/dL — ABNORMAL HIGH (ref <10)

## 2023-07-09 LAB — TROPONIN I (HIGH SENSITIVITY): Troponin I (High Sensitivity): 9 ng/L (ref <18)

## 2023-07-09 MED ORDER — AMLODIPINE BESYLATE 5 MG PO TABS
5.0000 mg | ORAL_TABLET | Freq: Once | ORAL | Status: AC
Start: 2023-07-09 — End: 2023-07-09
  Administered 2023-07-09: 5 mg via ORAL
  Filled 2023-07-09: qty 1

## 2023-07-09 MED ORDER — AMLODIPINE BESYLATE 5 MG PO TABS: 5.0000 mg | ORAL_TABLET | Freq: Every day | ORAL | 1 refills | Status: DC

## 2023-07-09 NOTE — ED Provider Notes (Signed)
 [place] [facility]MENT AT [facility] Provider Note   CSN: [id number] Arrival date & time: 07/08/23  2308     History  Chief Complaint  Patient presents with   Hypertension    Terrence Strickland is a 58 y.o. male.  HPI     This is a 58 year old male who presents with chest pain, headache and concerns for high blood pressure last 3 days.  He is supposed to take amlodipine  but states he cannot afford his medications.  He reports feeling foggy headed.  No true headache.  Also reports some chest pain.  Chest pain is nonexertional.  It is constant.  Nothing seems to make it better or worse.  Patient does endorse smoking marijuana prior to arrival and drinking at least 6 beers.  No recent coughs or fevers.  Home Medications Prior to Admission medications   Medication Sig Start Date End Date Taking? Authorizing Provider  amLODipine  (NORVASC ) 5 MG tablet Take 1 tablet (5 mg total) by mouth daily. 07/09/23  Yes Rheba Diamond, Charmaine FALCON, MD  amLODipine  (NORVASC ) 5 MG tablet Take 1 tablet (5 mg total) by mouth daily. 06/25/23   Melvenia Manus BRAVO, MD  ammonium lactate  (AMLACTIN) 12 % cream Apply 1 Application topically as needed for dry skin. 07/05/23   Gershon Donnice SAUNDERS, DPM  ciclopirox  (PENLAC ) 8 % solution Apply topically at bedtime. Apply over nail and surrounding skin. Apply daily over previous coat. After seven (7) days, may remove with alcohol and continue cycle. 07/05/23   Gershon Donnice SAUNDERS, DPM  pantoprazole  (PROTONIX ) 40 MG tablet Take 1 tablet (40 mg total) by mouth daily. 06/25/23   Dixon, Phillip E, MD  Vitamin D , Ergocalciferol , (DRISDOL ) 1.25 MG (50000 UNIT) CAPS capsule Take 1 capsule (50,000 Units total) by mouth every 7 (seven) days. 06/26/23   Melvenia Manus BRAVO, MD      Allergies    Nsaids and Tramadol     Review of Systems   Review of Systems  Constitutional:  Negative for fever.  Respiratory:  Negative for shortness of breath.   Cardiovascular:  Positive for chest  pain.  Gastrointestinal:  Negative for abdominal pain.  Neurological:  Positive for headaches.  All other systems reviewed and are negative.   Physical Exam Updated Vital Signs BP (!) 172/105   Pulse 87   Temp 99.2 F (37.3 C) (Oral)   Resp 20   Ht 1.803 m (5' 11)   Wt 77.1 kg   SpO2 98%   BMI 23.71 kg/m  Physical Exam Vitals and nursing note reviewed.  Constitutional:      Appearance: He is well-developed. He is not ill-appearing.  HENT:     Head: Normocephalic and atraumatic.     Mouth/Throat:     Mouth: Mucous membranes are moist.  Eyes:     Extraocular Movements: Extraocular movements intact.     Pupils: Pupils are equal, round, and reactive to light.     Comments: Bilateral injected conjunctiva  Cardiovascular:     Rate and Rhythm: Normal rate and regular rhythm.     Heart sounds: Normal heart sounds. No murmur heard. Pulmonary:     Effort: Pulmonary effort is normal. No respiratory distress.     Breath sounds: Normal breath sounds. No wheezing.  Abdominal:     Palpations: Abdomen is soft.     Tenderness: There is no abdominal tenderness. There is no rebound.  Musculoskeletal:     Cervical back: Neck supple.  Lymphadenopathy:  Cervical: No cervical adenopathy.  Skin:    General: Skin is warm and dry.  Neurological:     Mental Status: He is alert and oriented to person, place, and time.     Comments: Appears intoxicated but otherwise oriented, cranial nerves II through XII intact, 5 out of 5 strength in all 4 extremities  Psychiatric:        Mood and Affect: Mood normal.     ED Results / Procedures / Treatments   Labs (all labs ordered are listed, but only abnormal results are displayed) Labs Reviewed  BASIC METABOLIC PANEL - Abnormal; Notable for the following components:      Result Value   Potassium 3.1 (*)    Glucose, Bld 142 (*)    All other components within normal limits  ETHANOL - Abnormal; Notable for the following components:   Alcohol,  Ethyl (B) 160 (*)    All other components within normal limits  CBC  TROPONIN I (HIGH SENSITIVITY)  TROPONIN I (HIGH SENSITIVITY)    EKG EKG Interpretation Date/Time:  Monday July 08 2023 23:36:20 EST Ventricular Rate:  87 PR Interval:  156 QRS Duration:  86 QT Interval:  374 QTC Calculation: 450 R Axis:   38  Text Interpretation: Normal sinus rhythm Possible Left atrial enlargement Left ventricular hypertrophy ( Sokolow-Lyon , Romhilt-Estes ) Cannot rule out Septal infarct , age undetermined Abnormal ECG When compared with ECG of 08-Dec-2022 09:07, PREVIOUS ECG IS PRESENT Confirmed by Bari Pfeiffer ([id number]) on 07/08/2023 11:41:00 PM  Radiology DG Chest Port 1 View Result Date: 07/09/2023 CLINICAL DATA:  Chest pain EXAM: PORTABLE CHEST 1 VIEW COMPARISON:  05/26/2022 FINDINGS: The heart size and mediastinal contours are within normal limits. Both lungs are clear. The visualized skeletal structures are unremarkable. IMPRESSION: No active disease. Electronically Signed   By: Luke Bun M.D.   On: 07/09/2023 00:15    Procedures Procedures    Medications Ordered in ED Medications  amLODipine  (NORVASC ) tablet 5 mg (has no administration in time range)    ED Course/ Medical Decision Making/ A&P                                 Medical Decision Making Amount and/or Complexity of Data Reviewed Labs: ordered. Radiology: ordered.  Risk Prescription drug management.   This patient presents to the ED for concern of chest pain, foggy headedness, this involves an extensive number of treatment options, and is a complaint that carries with it a high risk of complications and morbidity.  I considered the following differential and admission for this acute, potentially life threatening condition.  The differential diagnosis includes ACS, PE, pneumothorax, pneumonia, hypertensive urgency, hypertensive emergency, intoxication  MDM:    This is a 58 year old male who presents with  foggy headedness and chest discomfort.  He is overall nontoxic.  Vital signs notable for blood pressure 172/105.  No signs or symptoms of hypertensive urgency or emergency.  He is neurologically intact.  EKG does not show any evidence of acute arrhythmia or ischemia.  It is unchanged from prior.  Basic lab work obtained.  Blood alcohol level 160.  CBC, BMP, troponin x 1 negative.  Given ongoing nature of pain, feel that 1 troponin is adequate to risk assess for myocardial stress.  Patient was given a dose of his home amlodipine .  Will recommend outpatient cardiology follow-up.  We discussed cessation of alcohol use.  (Labs, imaging,  consults)  Labs: I Ordered, and personally interpreted labs.  The pertinent results include: CBC, BMP, troponin, EtOH  Imaging Studies ordered: I ordered imaging studies including chest x-ray without pneumothorax or pneumonia I independently visualized and interpreted imaging. I agree with the radiologist interpretation  Additional history obtained from chart review.  External records from outside source obtained and reviewed including prior evaluations  Cardiac Monitoring: The patient was maintained on a cardiac monitor.  If on the cardiac monitor, I personally viewed and interpreted the cardiac monitored which showed an underlying rhythm of: Sinus  Reevaluation: After the interventions noted above, I reevaluated the patient and found that they have :stayed the same  Social Determinants of Health:  lives independently  Disposition: Discharge  Co morbidities that complicate the patient evaluation  Past Medical History:  Diagnosis Date   ADHD (attention deficit hyperactivity disorder)    Alcohol abuse    Depression    Hypertension    Irregular heart beat      Medicines Meds ordered this encounter  Medications   amLODipine  (NORVASC ) tablet 5 mg   amLODipine  (NORVASC ) 5 MG tablet    Sig: Take 1 tablet (5 mg total) by mouth daily.    Dispense:  30  tablet    Refill:  1    I have reviewed the patients home medicines and have made adjustments as needed  Problem List / ED Course: Problem List Items Addressed This Visit       Other   Alcohol intoxication (HCC)   Other Visit Diagnoses       Atypical chest pain    -  Primary     Uncontrolled hypertension       Relevant Medications   amLODipine  (NORVASC ) tablet 5 mg (Start on 07/09/2023  1:45 AM)   amLODipine  (NORVASC ) 5 MG tablet                   Final Clinical Impression(s) / ED Diagnoses Final diagnoses:  Atypical chest pain  Uncontrolled hypertension  Alcoholic intoxication without complication (HCC)    Rx / DC Orders ED Discharge Orders          Ordered    amLODipine  (NORVASC ) 5 MG tablet  Daily        07/09/23 0140              Bari Charmaine FALCON, MD 07/09/23 0145

## 2023-07-09 NOTE — Discharge Instructions (Signed)
You were seen today for chest pain.  You were notably hypertensive.  It is very important that you start your blood pressure medication.  Today your heart testing looks reassuring.  Follow-up closely with your primary doctor.

## 2023-07-23 ENCOUNTER — Encounter: Payer: Self-pay | Admitting: Internal Medicine

## 2023-07-23 ENCOUNTER — Ambulatory Visit (INDEPENDENT_AMBULATORY_CARE_PROVIDER_SITE_OTHER): Payer: Medicaid Other | Admitting: Internal Medicine

## 2023-07-23 VITALS — BP 141/107 | HR 81 | Ht 71.0 in | Wt 170.0 lb

## 2023-07-23 DIAGNOSIS — K2100 Gastro-esophageal reflux disease with esophagitis, without bleeding: Secondary | ICD-10-CM | POA: Diagnosis not present

## 2023-07-23 DIAGNOSIS — E559 Vitamin D deficiency, unspecified: Secondary | ICD-10-CM

## 2023-07-23 DIAGNOSIS — S39012A Strain of muscle, fascia and tendon of lower back, initial encounter: Secondary | ICD-10-CM

## 2023-07-23 DIAGNOSIS — Q7649 Other congenital malformations of spine, not associated with scoliosis: Secondary | ICD-10-CM

## 2023-07-23 DIAGNOSIS — I10 Essential (primary) hypertension: Secondary | ICD-10-CM

## 2023-07-23 DIAGNOSIS — G8929 Other chronic pain: Secondary | ICD-10-CM

## 2023-07-23 DIAGNOSIS — E876 Hypokalemia: Secondary | ICD-10-CM

## 2023-07-23 MED ORDER — CYCLOBENZAPRINE HCL 5 MG PO TABS: 5.0000 mg | ORAL_TABLET | Freq: Three times a day (TID) | ORAL | 1 refills | Status: DC | PRN

## 2023-07-23 MED ORDER — PANTOPRAZOLE SODIUM 40 MG PO TBEC: 40.0000 mg | DELAYED_RELEASE_TABLET | Freq: Every day | ORAL | 3 refills | Status: AC

## 2023-07-23 MED ORDER — METHYLPREDNISOLONE 4 MG PO TBPK: ORAL_TABLET | ORAL | 0 refills | Status: DC

## 2023-07-23 MED ORDER — AMLODIPINE BESYLATE 10 MG PO TABS: 10.0000 mg | ORAL_TABLET | Freq: Every day | ORAL | 3 refills | Status: DC

## 2023-07-23 MED ORDER — VITAMIN D (ERGOCALCIFEROL) 1.25 MG (50000 UNIT) PO CAPS: 50000.0000 [IU] | ORAL_CAPSULE | ORAL | 0 refills | Status: DC

## 2023-07-23 NOTE — Patient Instructions (Signed)
It was a pleasure to see you today.  Thank you for giving [place] the opportunity to be involved in your care.  Below is a brief recap of your visit and next steps.  We will plan to see you again in 3 months.  Summary Increase amlodipine to 10 mg daily Add medrol dosepack and flexeril for pain relief Physical therapy referral placed Repeat chemistry panel Follow up in 3 months

## 2023-07-23 NOTE — Progress Notes (Signed)
 Established Patient Office Visit  Subjective   Patient ID: Terrence Strickland, male    DOB: 12-21-1965  Age: 58 y.o. MRN: [id number]  Chief Complaint  Patient presents with   Hypertension    Four week follow up    Terrence Strickland returns to care today for HTN follow-up and lab review.  He was last evaluated by me on 1/21 as a new patient presenting to establish care.  His blood pressure was elevated at that time.  I recommended resuming amlodipine 5 mg daily as previously prescribed.  He additionally endorsed reflux symptoms that were poorly controlled.  I recommended resuming Protonix 40 mg daily as previously prescribed.  He was referred to podiatry in the setting of a corn on his right foot.  4-6-week follow-up was arranged for HTN check and lab review.  In the interim, he established care with podiatry on 1/31.  ED presentation on 2/4 in the setting of hypertension.  Cardiac workup was reassuring.  He received a dose of amlodipine and was discharged home with close PCP follow-up.  There have otherwise been no acute interval events.  Today Terrence Strickland acute concern is lumbar back pain for the last 10 days.  There was no inciting event or trauma at the onset of pain.  Pain is worse with flexion at the waist.  Denies numbness/weakness in his lower extremities, bowel/bladder incontinence, and saddle anesthesia.  This appears to be an acute on chronic issue as he previously had x-rays of the lumbar spine completed in August 2022 in the setting of lumbar back pain.  Past Medical History:  Diagnosis Date   ADHD (attention deficit hyperactivity disorder)    Alcohol abuse    Depression    Hypertension    Irregular heart beat    Past Surgical History:  Procedure Laterality Date   ESOPHAGOGASTRODUODENOSCOPY (EGD) WITH PROPOFOL N/A 10/19/2021   Procedure: ESOPHAGOGASTRODUODENOSCOPY (EGD) WITH PROPOFOL;  Surgeon: Corbin Ade, MD;  Location: [facility];  Service: Endoscopy;  Laterality: N/A;   Social  History   Tobacco Use   Smoking status: Every Day    Current packs/day: 0.50    Average packs/day: 0.5 packs/day for 15.0 years (7.5 ttl pk-yrs)    Types: Cigarettes   Smokeless tobacco: Never  Substance Use Topics   Alcohol use: Yes    Comment: 6pack beer a day   Drug use: No   Family History  Problem Relation Age of Onset   Cancer Other    Diabetes Other    Allergies  Allergen Reactions   Nsaids Other (See Comments)    hallucinations   Tramadol Other (See Comments)    Chills, hallucinations, sweating     Review of Systems  Constitutional:  Negative for chills and fever.  HENT:  Negative for sore throat.   Respiratory:  Negative for cough and shortness of breath.   Cardiovascular:  Negative for chest pain, palpitations and leg swelling.  Gastrointestinal:  Positive for heartburn. Negative for abdominal pain, blood in stool, constipation, diarrhea, nausea and vomiting.  Genitourinary:  Negative for dysuria and hematuria.  Musculoskeletal:  Positive for back pain (Lumbar back pain x 10 days). Negative for myalgias.  Skin:  Negative for itching and rash.  Neurological:  Negative for dizziness and headaches.  Psychiatric/Behavioral:  Negative for depression and suicidal ideas.      Objective:     BP (!) 141/107 (BP Location: Right Arm, Patient Position: Sitting, Cuff Size: Normal)   Pulse 81  Ht 5\' 11"  (1.803 m)   Wt 170 lb (77.1 kg)   SpO2 97%   BMI 23.71 kg/m  BP Readings from Last 3 Encounters:  07/23/23 (!) 141/107  07/08/23 (!) 172/105  06/25/23 (!) 156/104   Physical Exam Vitals reviewed.  Constitutional:      General: He is not in acute distress.    Appearance: Normal appearance. He is not ill-appearing.  HENT:     Head: Normocephalic and atraumatic.     Right Ear: External ear normal.     Left Ear: External ear normal.     Nose: Nose normal. No congestion or rhinorrhea.     Mouth/Throat:     Mouth: Mucous membranes are moist.     Pharynx:  Oropharynx is clear.  Eyes:     General: No scleral icterus.    Extraocular Movements: Extraocular movements intact.     Conjunctiva/sclera: Conjunctivae normal.     Pupils: Pupils are equal, round, and reactive to light.  Cardiovascular:     Rate and Rhythm: Normal rate and regular rhythm.     Pulses: Normal pulses.     Heart sounds: Normal heart sounds. No murmur heard. Pulmonary:     Effort: Pulmonary effort is normal.     Breath sounds: Normal breath sounds. No wheezing, rhonchi or rales.  Abdominal:     General: Abdomen is flat. Bowel sounds are normal. There is no distension.     Palpations: Abdomen is soft.     Tenderness: There is no abdominal tenderness.  Musculoskeletal:        General: Tenderness (Midline lumbar spine) present. No swelling or deformity.     Cervical back: Normal range of motion.     Comments: ROM at the waist is limited in general secondary pain  Skin:    General: Skin is warm and dry.     Capillary Refill: Capillary refill takes less than 2 seconds.  Neurological:     General: No focal deficit present.     Mental Status: He is alert and oriented to person, place, and time.     Motor: No weakness.  Psychiatric:        Mood and Affect: Mood normal.        Behavior: Behavior normal.        Thought Content: Thought content normal.   Last CBC Lab Results  Component Value Date   WBC 5.4 07/09/2023   HGB 14.2 07/09/2023   HCT 42.6 07/09/2023   MCV 93.4 07/09/2023   MCH 31.1 07/09/2023   RDW 14.2 07/09/2023   PLT 255 07/09/2023   Last metabolic panel Lab Results  Component Value Date   GLUCOSE 134 (H) 07/23/2023   NA 138 07/23/2023   K 4.4 07/23/2023   CL 95 (L) 07/23/2023   CO2 25 07/23/2023   BUN 21 07/23/2023   CREATININE 1.41 (H) 07/23/2023   GFRNONAA >60 07/09/2023   CALCIUM 9.0 07/23/2023   PHOS 2.9 10/18/2021   PROT 7.1 06/25/2023   ALBUMIN 4.2 06/25/2023   LABGLOB 2.9 06/25/2023   BILITOT 0.2 06/25/2023   ALKPHOS 68 06/25/2023    AST 26 06/25/2023   ALT 15 06/25/2023   ANIONGAP 13 07/09/2023   Last lipids Lab Results  Component Value Date   CHOL 152 06/25/2023   HDL 58 06/25/2023   LDLCALC 58 06/25/2023   TRIG 229 (H) 06/25/2023   CHOLHDL 2.6 06/25/2023   Last hemoglobin A1c Lab Results  Component Value Date  HGBA1C 5.3 06/25/2023   Last thyroid functions Lab Results  Component Value Date   TSH 0.459 06/25/2023   Last vitamin D Lab Results  Component Value Date   VD25OH 16.7 (L) 06/25/2023   Last vitamin B12 and Folate Lab Results  Component Value Date   VITAMINB12 457 06/25/2023   FOLATE 10.7 06/25/2023   The 10-year ASCVD risk score (Arnett DK, et al., 2019) is: 20.7%    Assessment & Plan:   Problem List Items Addressed This Visit       Essential hypertension - Primary (Chronic)   Presenting today for HTN follow-up.  I recommended resuming amlodipine 5 mg daily at his previous appointment as his blood pressure was elevated and previously well-controlled with amlodipine.  He did not immediately resume medication.  ED presentation earlier this month in the setting of hypertension.  He was treated with amlodipine and discharged home.  BP today has improved but remains mildly elevated. -Increase amlodipine to 10 mg daily      Reflux esophagitis (Chronic)   He continues to endorse poorly controlled reflux symptoms.  I recommended resuming Protonix 40 mg daily at his last appointment but he did not fill his prescription.  New prescription sent today.      Hypokalemia   Noted on labs from 2/4.  Repeat BMP ordered today      Vitamin D deficiency   Noted on recent labs.  High-dose, weekly vitamin D supplementation was prescribed, however he did not fill the prescription.  New prescription sent today.      Acute on chronic low back pain   His acute concern today is a 10-day history of lumbar back pain.  As otherwise documented, there was no inciting event or trauma at the onset of pain.   No red flag symptoms identified.  On exam there is tenderness to palpation over the midline lumbar spine.  ROM at the waist is limited secondary to pain.  This appears to be an acute on chronic issue.  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 -Treatment options reviewed.  Medrol Dosepak and Flexeril prescribed for acute pain relief.  We discussed that physical therapy will be a key component of preventing future flares of back pain.  Through shared decision making, a referral to physical therapy was placed today.      Return in about 3 months (around 10/20/2023).   Billie Lade, MD

## 2023-07-24 ENCOUNTER — Other Ambulatory Visit: Payer: Self-pay | Admitting: Internal Medicine

## 2023-07-24 ENCOUNTER — Encounter: Payer: Self-pay | Admitting: Internal Medicine

## 2023-07-24 DIAGNOSIS — N179 Acute kidney failure, unspecified: Secondary | ICD-10-CM

## 2023-07-24 LAB — BASIC METABOLIC PANEL
BUN/Creatinine Ratio: 15 (ref 9–20)
BUN: 21 mg/dL (ref 6–24)
CO2: 25 mmol/L (ref 20–29)
Calcium: 9 mg/dL (ref 8.7–10.2)
Chloride: 95 mmol/L — ABNORMAL LOW (ref 96–106)
Creatinine, Ser: 1.41 mg/dL — ABNORMAL HIGH (ref 0.76–1.27)
Glucose: 134 mg/dL — ABNORMAL HIGH (ref 70–99)
Potassium: 4.4 mmol/L (ref 3.5–5.2)
Sodium: 138 mmol/L (ref 134–144)
eGFR: 58 mL/min/{1.73_m2} — ABNORMAL LOW (ref >59)

## 2023-07-26 NOTE — Progress Notes (Signed)
No answer from patient 

## 2023-08-01 ENCOUNTER — Ambulatory Visit (HOSPITAL_COMMUNITY): Payer: Medicaid Other

## 2023-08-18 DIAGNOSIS — E559 Vitamin D deficiency, unspecified: Secondary | ICD-10-CM | POA: Insufficient documentation

## 2023-08-18 DIAGNOSIS — G8929 Other chronic pain: Secondary | ICD-10-CM | POA: Insufficient documentation

## 2023-08-18 NOTE — Assessment & Plan Note (Signed)
 Noted on recent labs.  High-dose, weekly vitamin D supplementation was prescribed, however he did not fill the prescription.  New prescription sent today.

## 2023-08-18 NOTE — Assessment & Plan Note (Signed)
 Noted on labs from 2/4.  Repeat BMP ordered today

## 2023-08-18 NOTE — Assessment & Plan Note (Signed)
 He continues to endorse poorly controlled reflux symptoms.  I recommended resuming Protonix 40 mg daily at his last appointment but he did not fill his prescription.  New prescription sent today.

## 2023-08-18 NOTE — Assessment & Plan Note (Signed)
 Presenting today for HTN follow-up.  I recommended resuming amlodipine 5 mg daily at his previous appointment as his blood pressure was elevated and previously well-controlled with amlodipine.  He did not immediately resume medication.  ED presentation earlier this month in the setting of hypertension.  He was treated with amlodipine and discharged home.  BP today has improved but remains mildly elevated. -Increase amlodipine to 10 mg daily

## 2023-08-18 NOTE — Assessment & Plan Note (Signed)
 His acute concern today is a 10-day history of lumbar back pain.  As otherwise documented, there was no inciting event or trauma at the onset of pain.  No red flag symptoms identified.  On exam there is tenderness to palpation over the midline lumbar spine.  ROM at the waist is limited secondary to pain.  This appears to be an acute on chronic issue.  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 -Treatment options reviewed.  Medrol Dosepak and Flexeril prescribed for acute pain relief.  We discussed that physical therapy will be a key component of preventing future flares of back pain.  Through shared decision making, a referral to physical therapy was placed today.

## 2023-08-26 ENCOUNTER — Ambulatory Visit (HOSPITAL_COMMUNITY): Payer: Medicaid Other | Attending: Internal Medicine

## 2023-08-26 ENCOUNTER — Other Ambulatory Visit: Payer: Self-pay

## 2023-08-26 ENCOUNTER — Telehealth: Payer: Self-pay

## 2023-08-26 ENCOUNTER — Encounter (HOSPITAL_COMMUNITY): Payer: Self-pay

## 2023-08-26 DIAGNOSIS — Q7649 Other congenital malformations of spine, not associated with scoliosis: Secondary | ICD-10-CM | POA: Diagnosis present

## 2023-08-26 DIAGNOSIS — M5459 Other low back pain: Secondary | ICD-10-CM | POA: Insufficient documentation

## 2023-08-26 DIAGNOSIS — S39012A Strain of muscle, fascia and tendon of lower back, initial encounter: Secondary | ICD-10-CM | POA: Diagnosis not present

## 2023-08-26 DIAGNOSIS — M6281 Muscle weakness (generalized): Secondary | ICD-10-CM | POA: Insufficient documentation

## 2023-08-26 NOTE — Telephone Encounter (Signed)
 Patient called and he says that the dose pack has 6 pills, then 5 all the way down to 1 pill. I asked if he has the pack with him, he says no. I advised to follow the instructions on the package taking it as stated starting off with the 6 pills decreasing by 1 each day until he has none to take. He said thank you.  Copied from CRM [id number]. Topic: Clinical - Medication Question >> Aug 26, 2023  2:51 PM Dondra Prader E wrote: Reason for CRM: Pt has questions about how to take his methylPREDNISolone (MEDROL DOSEPAK) 4 MG TBPK tablet   [phone]

## 2023-08-26 NOTE — Therapy (Signed)
 OUTPATIENT PHYSICAL THERAPY THORACOLUMBAR EVALUATION   Patient Name: Terrence Strickland MRN: [id number] DOB:1966/04/20, 58 y.o., male Today's Date: 08/26/2023  END OF SESSION:  PT End of Session - 08/26/23 0933     Visit Number 1    Number of Visits 9    Date for PT Re-Evaluation 09/23/23    Authorization Type Medicaid [employer]    Authorization Time Period Seeking new auth    Progress Note Due on Visit 9    PT Start Time 0847    PT Stop Time 0926    PT Time Calculation (min) 39 min    Behavior During Therapy [facility] for tasks assessed/performed             Past Medical History:  Diagnosis Date   ADHD (attention deficit hyperactivity disorder)    Alcohol abuse    Depression    Hypertension    Irregular heart beat    Past Surgical History:  Procedure Laterality Date   ESOPHAGOGASTRODUODENOSCOPY (EGD) WITH PROPOFOL N/A 10/19/2021   Procedure: ESOPHAGOGASTRODUODENOSCOPY (EGD) WITH PROPOFOL;  Surgeon: Corbin Ade, MD;  Location: [facility];  Service: Endoscopy;  Laterality: N/A;   Patient Active Problem List   Diagnosis Date Noted   Vitamin D deficiency 08/18/2023   Acute on chronic low back pain 08/18/2023   Corn of foot 06/25/2023   Colon cancer screening 06/25/2023   ROTAVIRUS Acute gastroenteritis 10/26/2021   Tobacco abuse 10/26/2021   AKI (acute kidney injury) (HCC) 10/25/2021   Essential hypertension 10/25/2021   Reflux esophagitis 10/25/2021   Hematemesis 10/18/2021   Alcohol abuse 10/18/2021   Hypokalemia 10/18/2021   Alcohol intoxication (HCC) 10/18/2021   Multiple thyroid nodules 02/20/2018   Herniated lumbar intervertebral disc 03/18/2013   Lumbosacral spondylosis without myelopathy 03/18/2013   DEGENERATIVE DISC DISEASE, LUMBOSACRAL SPINE 10/18/2008    PCP: Billie Lade, MD  REFERRING PROVIDER: Billie Lade, MD  REFERRING DIAG:  [phone] (ICD-10-CM) - Strain of lumbar region, initial encounter  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   SUBJECTIVE:                                                                                                                                                                                           SUBJECTIVE STATEMENT: Pt reports chronic back pain that has been going on for 4-5 years. He reports that he always feels sore. Does not work, pt reports he does  not necessarily do hard work. Pt reports that his back pain might be related to the way he sleeps, curls up in a ball. Sitting is pt's worst position. Pt's recliner is most comfortable.   PERTINENT HISTORY:  ADHD Alcohol Abuse Depression HTN Irregular heart beat  PAIN:  Are you having pain? Yes: NPRS scale: 6/10 Pain location: down the center of back Pain description: Sore Aggravating factors: Lifting Relieving factors: Nothing helps  PRECAUTIONS: None  RED FLAGS: None   WEIGHT BEARING RESTRICTIONS: No  FALLS:  Has patient fallen in last 6 months? No  PATIENT GOALS: "get relief from back pain"   OBJECTIVE:  Note: Objective measures were completed at Evaluation unless otherwise noted.  DIAGNOSTIC FINDINGS:  CLINICAL DATA:  Back pain, no known injury, initial encounter   EXAM: LUMBAR SPINE - COMPLETE 4+ VIEW   COMPARISON:  02/20/2018   FINDINGS: Transitional anatomy is again identified. Partial sacralization of the 6 lumbar type vertebral body is noted. Osteophytic changes are seen. No pars defects are noted. Disc space narrowing is seen at L4-5. No anterolisthesis is noted. No soft tissue abnormality is seen.   IMPRESSION: Stable degenerative change.     Electronically Signed   By: Alcide Clever M.D.   On: 01/09/2021 21:57  PATIENT SURVEYS:  Modified Oswestry 16/50 = 32%   COGNITION: Overall  cognitive status: Within functional limits for tasks assessed     SENSATION: WFL   POSTURE: rounded shoulders and forward head  PALPATION: Normal tension through lumbar paraspinals  LUMBAR ROM:   AROM eval  Flexion 100% with bouncing  Extension 50%  Right lateral flexion 75%  Left lateral flexion 75%  Right rotation 100%  Left rotation 100%   (Blank rows = not tested)  LOWER EXTREMITY ROM:     Active  Right eval Left eval  Hip flexion    Hip extension    Hip abduction    Hip adduction    Hip internal rotation 15 20  Hip external rotation 32 36  Knee flexion    Knee extension    Ankle dorsiflexion    Ankle plantarflexion    Ankle inversion    Ankle eversion     (Blank rows = not tested)  LOWER EXTREMITY MMT:    MMT Right eval Left eval  Hip flexion 3+ 3+  Hip extension 3+ 3+  Hip abduction 3+ 3+  Hip adduction    Hip internal rotation    Hip external rotation    Knee flexion    Knee extension    Ankle dorsiflexion    Ankle plantarflexion    Ankle inversion    Ankle eversion     (Blank rows = not tested)  LUMBAR SPECIAL TESTS:  Quadrant test: Positive  FUNCTIONAL TESTS:  30 seconds chair stand test: 9x L SLS: 35 seconds  R SLS: 15s   Norms: 18-39  F: 43.5 seconds  M: 43.2 seconds 40-49  F: 40.4 seconds  M: 40.1 seconds 50-59  F: 36 seconds  M: 38.1 seconds 60-69  F: 25.1 seconds  M: 28.7 seconds 70-79  F: 11.3 seconds  M: 18.3 seconds  GAIT: Distance walked: 83ft Assistive device utilized: None Level of assistance: Complete Independence Comments: limited foot clearance, foot sliding gait pattern but no other major abnormalities.   TREATMENT DATE:  08/26/2023 Evaluation            LTR x 30 Glute Bridges x 15 Clamshells x 15  TA bracing 3 x 10''  PATIENT EDUCATION:  Education details: PT Evaluation,  findings, prognosis, frequency, attendance policy, and HEP. Person educated: Patient Education method: Medical illustrator Education comprehension: verbalized understanding  HOME EXERCISE PROGRAM:  Access Code: [email or web address] URL: [email or web address] Date: 08/26/2023 Prepared by: Starling Manns  Exercises - Supine Lower Trunk Rotation  - 1 x daily - 7 x weekly - 3 sets - 10 reps - Supine Bridge  - 1 x daily - 7 x weekly - 3 sets - 10 reps - Clamshell with Resistance  - 1 x daily - 7 x weekly - 3 sets - 10 reps - Supine Transversus Abdominis Bracing - Hands on Stomach  - 1 x daily - 7 x weekly - 3 sets - 10 reps  ASSESSMENT:  CLINICAL IMPRESSION: Patient is a 58 y.o. 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 Does show limited extension in lumbar spine with limited lateral flexion as well. Pt with flat affect and poor demonstrating of excessive pain in areas tested. Does show significant BLE hip weakness with limited lumbar ROM, these deficits on top of degenerative changes in lumbar spine are limiting pt's functional mobility, balance and reducing pt's QOL. Pt will benefit from skilled Physical Therapy services to address deficits/limitations in order to improve functional and QOL.    OBJECTIVE IMPAIRMENTS: decreased balance, decreased mobility, decreased ROM, decreased strength, hypomobility, increased fascial restrictions, postural dysfunction, and pain.   ACTIVITY LIMITATIONS: carrying, lifting, bending, sitting, standing, squatting, transfers, and locomotion level  PARTICIPATION LIMITATIONS: cleaning, laundry, shopping, community activity, occupation, and yard work  PERSONAL FACTORS: Age and 3+ comorbidities: alcohol abuse, HTN, ADHA  are also affecting patient's functional outcome.    REHAB POTENTIAL: Good  CLINICAL DECISION MAKING: Stable/uncomplicated  EVALUATION COMPLEXITY: Low   GOALS: Goals reviewed with patient? No  SHORT TERM GOALS: Target date: 09/09/2023  Pt will be independent with HEP in order to demonstrate participation in Physical Therapy POC.  Baseline: Goal status: INITIAL  2.  Pt will report 4/10 pain with mobility in order to demonstrate improved pain with ADLs.  Baseline:  Goal status: INITIAL  LONG TERM GOALS: Target date: 09/23/2023  Pt will improve 30 Second Chair Stand Test by at least 2 reps in order to demonstrate improved functional strength to return to desired activities.  Baseline: See objective.  Goal status: INITIAL  2.  Pt will improve BLE MMT by at least 1/2 grade in order to demonstrate improved functional mobility capacity in community setting and reduce pain with mobility. Baseline: See objective.  Goal status: INITIAL  3.  Pt will improve Modified Oswestry score by 8 points in order to demonstrate improved pain with functional goals and outcomes. Baseline: See objective.  Goal status: INITIAL  4.  Pt will report 2/10 pain with mobility in order to demonstrate reduced pain with ADLs lasting greater than 30 minutes.  Baseline: See objective.  Goal status: INITIAL  PLAN:  PT FREQUENCY: 2x/week  PT DURATION: 4 weeks  PLANNED INTERVENTIONS: 97164- PT Re-evaluation, 97110-Therapeutic exercises, 97530- Therapeutic activity, 97112- Neuromuscular re-education, 97535- Self Care, 86578- Manual therapy, [phone]- Gait training, [phone]- Electrical stimulation (unattended), [phone]- Electrical stimulation (manual), Patient/Family education, Balance training, Taping, Dry Needling, Joint mobilization, Joint manipulation, Spinal manipulation, Spinal mobilization, Cryotherapy, and Moist heat.  PLAN FOR NEXT SESSION: Lumbar ROM, BLE hip strengthening.   Terrilyn Saver  Daryl Eastern, DPT [facility]- [facility] [phone] office   Managed Medicaid Authorization Request  Visit Dx Codes: G95.621H  Q76.49  Functional Tool Score: Modified Oswestry: 32%  For all possible CPT codes, reference the Planned Interventions line above.     Check all conditions that are expected to impact treatment: {Conditions expected to impact treatment:Unknown   If treatment provided at initial evaluation, no treatment charged due to lack of authorization.      Nelida Meuse, PT 08/26/2023, 9:36 AM

## 2023-09-03 ENCOUNTER — Encounter (HOSPITAL_COMMUNITY): Payer: Self-pay

## 2023-09-03 ENCOUNTER — Encounter (HOSPITAL_COMMUNITY)

## 2023-09-03 NOTE — Therapy (Signed)
 [facility] Outpatient Rehabilitation at [facility] [address] [place], [place], [address] Phone: [phone]   Fax:  [phone]  Patient Details  Name: Terrence Strickland MRN: [id number] Date of Birth: 01/14/1966 Referring Provider:  No ref. provider found  Encounter Date: 09/03/2023  Patient was called and PT left a message concerning missing 8am appointment this morning and reminded him of the no show policy.  Luz Lex, PT, DPT [facility] Office: [phone] 8:27 AM, 09/03/23   [facility] Outpatient Rehabilitation at [facility] [address] [place], [place], [address] Phone: [phone]   Fax:  [phone]

## 2023-09-03 NOTE — Therapy (Signed)
 [facility] Outpatient Rehabilitation at [facility] [address] [place], [place], [address] Phone: [phone]   Fax:  [phone]  Patient Details  Name: Terrence Strickland MRN: [id number] Date of Birth: [birth year] Referring Provider:  No ref. provider found  Encounter Date: 09/03/2023   Luz Lex, PT 09/03/2023, 8:26 AM  [facility] Outpatient Rehabilitation at [facility] [address] [place], [place], [address] Phone: [phone]   Fax:  [phone]

## 2023-09-05 ENCOUNTER — Encounter (HOSPITAL_COMMUNITY): Payer: Self-pay

## 2023-09-05 ENCOUNTER — Encounter (HOSPITAL_COMMUNITY)

## 2023-09-05 NOTE — Therapy (Deleted)
 [facility] Outpatient Rehabilitation at [facility] [address] [place], [place], [address] Phone: [phone]   Fax:  [phone]  Patient Details  Name: Terrence Strickland MRN: [id number] Date of Birth: 1966/01/18 Referring Provider:  Billie Lade, MD  Encounter Date: 09/05/2023  Called patients contact number on file and left message reguarding his missed appointment this morning at 8 AM. Left instructions to call front desk for scheduling.  Luz Lex, PT, DPT [facility] Office: [phone] 8:25 AM, 09/05/23   [facility] Outpatient Rehabilitation at [facility] [address] [place], [place], [address] Phone: [phone]   Fax:  [phone]

## 2023-09-05 NOTE — Therapy (Signed)
 [facility] Outpatient Rehabilitation at [facility] [address] [place], [place], [address] Phone: [phone]   Fax:  [phone]  Patient Details  Name: Terrence Strickland MRN: [id number] Date of Birth: [birth year] Referring Provider:  No ref. provider found  Encounter Date: 09/05/2023   Patient was called and PT left a message concerning missing 8am appointment this morning and reminded him of the no show policy.   Luz Lex, PT, DPT [facility] Office: [phone] 8:27 AM, 09/03/23  [facility] Outpatient Rehabilitation at [facility] [address] [place], [place], [address] Phone: [phone]   Fax:  [phone]

## 2023-09-10 ENCOUNTER — Encounter (HOSPITAL_COMMUNITY)

## 2023-09-10 ENCOUNTER — Encounter (HOSPITAL_COMMUNITY): Payer: Self-pay

## 2023-09-10 NOTE — Therapy (Signed)
 [facility] Outpatient Rehabilitation at [facility] [address] [place], [place], [address] Phone: [phone]   Fax:  [phone]  Patient Details  Name: Terrence Strickland MRN: [id number] Date of Birth: [birth year] Referring Provider:  No ref. provider found  Encounter Date: 09/10/2023  PT called and left a message concerning appointment this morning at 8AM. Pt was instructed to call to reschedule.  Luz Lex, PT, DPT [facility] Office: [phone] 8:25 AM, 09/10/23   [facility] Health Outpatient Rehabilitation at [facility] [address] [place], [place], [address] Phone: [phone]   Fax:  [phone]

## 2023-09-12 ENCOUNTER — Encounter (HOSPITAL_COMMUNITY): Admitting: Physical Therapy

## 2023-09-16 ENCOUNTER — Other Ambulatory Visit: Payer: Self-pay

## 2023-09-16 ENCOUNTER — Emergency Department (HOSPITAL_COMMUNITY)
Admission: EM | Admit: 2023-09-16 | Discharge: 2023-09-16 | Disposition: A | Discharge status: Home / Self Care | Attending: Emergency Medicine | Admitting: Emergency Medicine

## 2023-09-16 ENCOUNTER — Encounter (HOSPITAL_COMMUNITY): Payer: Self-pay

## 2023-09-16 DIAGNOSIS — Z79899 Other long term (current) drug therapy: Secondary | ICD-10-CM | POA: Diagnosis not present

## 2023-09-16 DIAGNOSIS — I10 Essential (primary) hypertension: Secondary | ICD-10-CM | POA: Diagnosis not present

## 2023-09-16 DIAGNOSIS — R519 Headache, unspecified: Secondary | ICD-10-CM | POA: Diagnosis present

## 2023-09-16 LAB — BASIC METABOLIC PANEL WITH GFR
Anion gap: 13 (ref 5–15)
BUN: 11 mg/dL (ref 6–20)
CO2: 20 mmol/L — ABNORMAL LOW (ref 22–32)
Calcium: 8.9 mg/dL (ref 8.9–10.3)
Chloride: 104 mmol/L (ref 98–111)
Creatinine, Ser: 0.82 mg/dL (ref 0.61–1.24)
GFR, Estimated: >60 mL/min (ref >60)
Glucose, Bld: 89 mg/dL (ref 70–99)
Potassium: 3.6 mmol/L (ref 3.5–5.1)
Sodium: 137 mmol/L (ref 135–145)

## 2023-09-16 LAB — CBC WITH DIFFERENTIAL/PLATELET
Abs Immature Granulocytes: 0.03 10*3/uL (ref 0.00–0.07)
Basophils Absolute: 0 10*3/uL (ref 0.0–0.1)
Basophils Relative: 1 %
Eosinophils Absolute: 0.1 10*3/uL (ref 0.0–0.5)
Eosinophils Relative: 1 %
HCT: 43.4 % (ref 39.0–52.0)
Hemoglobin: 14.3 g/dL (ref 13.0–17.0)
Immature Granulocytes: 0 %
Lymphocytes Relative: 10 %
Lymphs Abs: 0.9 10*3/uL (ref 0.7–4.0)
MCH: 31 pg (ref 26.0–34.0)
MCHC: 32.9 g/dL (ref 30.0–36.0)
MCV: 94.1 fL (ref 80.0–100.0)
Monocytes Absolute: 0.4 10*3/uL (ref 0.1–1.0)
Monocytes Relative: 4 %
Neutro Abs: 7.2 10*3/uL (ref 1.7–7.7)
Neutrophils Relative %: 84 %
Platelets: 258 10*3/uL (ref 150–400)
RBC: 4.61 MIL/uL (ref 4.22–5.81)
RDW: 14.3 % (ref 11.5–15.5)
WBC: 8.7 10*3/uL (ref 4.0–10.5)
nRBC: 0 % (ref 0.0–0.2)

## 2023-09-16 LAB — TROPONIN I (HIGH SENSITIVITY): Troponin I (High Sensitivity): 4 ng/L (ref <18)

## 2023-09-16 MED ORDER — AMLODIPINE BESYLATE 5 MG PO TABS
10.0000 mg | ORAL_TABLET | Freq: Once | ORAL | Status: AC
  Administered 2023-09-16: 10 mg via ORAL
  Filled 2023-09-16: qty 2

## 2023-09-16 MED ORDER — OXYCODONE HCL 5 MG PO TABS
5.0000 mg | ORAL_TABLET | Freq: Once | ORAL | Status: AC
  Administered 2023-09-16: 5 mg via ORAL
  Filled 2023-09-16: qty 1

## 2023-09-16 MED ORDER — HYDRALAZINE HCL 20 MG/ML IJ SOLN
5.0000 mg | Freq: Once | INTRAMUSCULAR | Status: AC
  Administered 2023-09-16: 5 mg via INTRAVENOUS
  Filled 2023-09-16: qty 1

## 2023-09-16 MED ORDER — AMLODIPINE BESYLATE 10 MG PO TABS
10.0000 mg | ORAL_TABLET | Freq: Every day | ORAL | 0 refills | Status: DC
Start: 2023-09-16 — End: 2023-10-15

## 2023-09-16 NOTE — ED Notes (Signed)
 Pt informed he is being discharged. Pt stated "no I'm not because now I'm having chest pains." Notifying provider.

## 2023-09-16 NOTE — ED Provider Notes (Addendum)
 [place] EMERGENCY DEPARTMENT AT [facility] Provider Note   CSN: [id number] Arrival date & time: 09/16/23  1515     History  Chief Complaint  Patient presents with   Headache    Terrence Strickland is a 58 y.o. male.  Patient is a 58 year old male with past medical history of alcohol abuse, depression, ADHD, and hypertension presenting for complaints of headache.  Patient states he has a posterior headache that started this morning.  Admits to some blurred vision.  Denies any sensation or motor dysfunction.  Denies any slurred speech, confusion, facial asymmetry.  Denies any recent falls or head trauma.  Attributes his symptoms to being out of his antihypertensive for the past week.  Normally takes amlodipine 10 mg daily.  Blood pressure on arrival 173/123.  The history is provided by the patient. No language interpreter was used.  Headache Associated symptoms: no abdominal pain, no back pain, no cough, no ear pain, no eye pain, no fever, no seizures, no sore throat and no vomiting        Home Medications Prior to Admission medications   Medication Sig Start Date End Date Taking? Authorizing Provider  amLODipine (NORVASC) 10 MG tablet Take 1 tablet (10 mg total) by mouth daily. 09/16/23  Yes Edwin Dada P, DO  ammonium lactate (AMLACTIN) 12 % cream Apply 1 Application topically as needed for dry skin. 07/05/23   Terrence Strickland, DPM  ciclopirox (PENLAC) 8 % solution Apply topically at bedtime. Apply over nail and surrounding skin. Apply daily over previous coat. After seven (7) days, may remove with alcohol and continue cycle. 07/05/23   Terrence Strickland, DPM  cyclobenzaprine (FLEXERIL) 5 MG tablet Take 1 tablet (5 mg total) by mouth 3 (three) times daily as needed for muscle spasms. 07/23/23   Billie Lade, MD  methylPREDNISolone (MEDROL DOSEPAK) 4 MG TBPK tablet Use as directed. 07/23/23   Billie Lade, MD  pantoprazole (PROTONIX) 40 MG tablet Take 1 tablet (40 mg  total) by mouth daily. 07/23/23   Billie Lade, MD  Vitamin D, Ergocalciferol, (DRISDOL) 1.25 MG (50000 UNIT) CAPS capsule Take 1 capsule (50,000 Units total) by mouth every 7 (seven) days. 07/23/23   Billie Lade, MD      Allergies    Nsaids and Tramadol    Review of Systems   Review of Systems  Constitutional:  Negative for chills and fever.  HENT:  Negative for ear pain and sore throat.   Eyes:  Positive for visual disturbance. Negative for pain.  Respiratory:  Negative for cough and shortness of breath.   Cardiovascular:  Negative for chest pain and palpitations.  Gastrointestinal:  Negative for abdominal pain and vomiting.  Genitourinary:  Negative for dysuria and hematuria.  Musculoskeletal:  Negative for arthralgias and back pain.  Skin:  Negative for color change and rash.  Neurological:  Positive for headaches. Negative for seizures and syncope.  All other systems reviewed and are negative.   Physical Exam Updated Vital Signs BP (!) 167/108   Pulse 71   Temp 99.1 F (37.3 C) (Oral)   Resp 16   Ht 5\' 11"  (1.803 m)   Wt 77.1 kg   SpO2 100%   BMI 23.71 kg/m  Physical Exam Vitals and nursing note reviewed.  Constitutional:      General: He is not in acute distress.    Appearance: He is well-developed.  HENT:     Head: Normocephalic and atraumatic.  Eyes:     General: Lids are normal. Vision grossly intact.     Conjunctiva/sclera: Conjunctivae normal.     Pupils: Pupils are equal, round, and reactive to light.  Cardiovascular:     Rate and Rhythm: Normal rate and regular rhythm.     Heart sounds: No murmur heard. Pulmonary:     Effort: Pulmonary effort is normal. No respiratory distress.     Breath sounds: Normal breath sounds.  Abdominal:     Palpations: Abdomen is soft.     Tenderness: There is no abdominal tenderness.  Musculoskeletal:        General: No swelling.     Cervical back: Neck supple.  Skin:    General: Skin is warm and dry.      Capillary Refill: Capillary refill takes less than 2 seconds.  Neurological:     Mental Status: He is alert.     GCS: GCS eye subscore is 4. GCS verbal subscore is 5. GCS motor subscore is 6.     Cranial Nerves: Cranial nerves 2-12 are intact.     Sensory: Sensation is intact.     Motor: Motor function is intact.     Coordination: Coordination is intact.  Psychiatric:        Mood and Affect: Mood normal.     ED Results / Procedures / Treatments   Labs (all labs ordered are listed, but only abnormal results are displayed) Labs Reviewed  BASIC METABOLIC PANEL WITH GFR - Abnormal; Notable for the following components:      Result Value   CO2 20 (*)    All other components within normal limits  CBC WITH DIFFERENTIAL/PLATELET  TROPONIN I (HIGH SENSITIVITY)    EKG None  Radiology No results found.  Procedures Procedures    Medications Ordered in ED Medications  oxyCODONE (Oxy IR/ROXICODONE) immediate release tablet 5 mg (has no administration in time range)  amLODipine (NORVASC) tablet 10 mg (10 mg Oral Given 09/16/23 1627)  hydrALAZINE (APRESOLINE) injection 5 mg (5 mg Intravenous Given 09/16/23 1626)  hydrALAZINE (APRESOLINE) injection 5 mg (5 mg Intravenous Given 09/16/23 1751)    ED Course/ Medical Decision Making/ A&P                                 Medical Decision Making Amount and/or Complexity of Data Reviewed Labs: ordered.  Risk Prescription drug management.    58 year old male with past medical history of alcohol abuse, depression, ADHD, and hypertension presenting for complaints of headache and blurred vision after being out of his antihypertensive for 7 days.  On exam patient is alert and oriented x 3, no neurological deficits, normal gross vision, PERRLA.  Blood pressure 173/123.  Otherwise stable vital signs.  Twelve-lead EKG demonstrates rhythm.  Patient's home prescription of amlodipine 10 mg ordered.  Hydralazine 5 mg IV given.  On reevaluation  patient admits to some chest pain.  Troponin drawn and within normal limits.  EKG demonstrates sinus rhythm.  Blood pressure improving.    Patient did has improvement of symptoms to blood pressure control.  Symptoms likely secondary to poorly controlled hypertension.  No hypertensive emergency at this time.  Safe for discharge home.  Patient's home medications refilled.  Patient in no distress and overall condition improved here in the ED. Detailed discussions were had with the patient regarding current findings, and need for close f/u with PCP or on call doctor. The patient  has been instructed to return immediately if the symptoms worsen in any way for re-evaluation. Patient verbalized understanding and is in agreement with current care plan. All questions answered prior to discharge.    Final Clinical Impression(s) / ED Diagnoses Final diagnoses:  Poorly-controlled hypertension    Rx / DC Orders ED Discharge Orders          Ordered    amLODipine (NORVASC) 10 MG tablet  Daily        09/16/23 1739              Quinn Bucco, DO 09/16/23 1850

## 2023-09-16 NOTE — ED Triage Notes (Signed)
 Pt BIB ems for headache this morning upon awakening. Per EMS pt has not been taking BP medication. Pt asked why he has not been taking his medication he states "got to find it." Pt state pounding headache posterior and on both sides.

## 2023-09-17 ENCOUNTER — Encounter (HOSPITAL_COMMUNITY): Admitting: Physical Therapy

## 2023-09-18 NOTE — Telephone Encounter (Unsigned)
 Copied from CRM [id number]. Topic: Clinical - Medical Advice >> Sep 17, 2023  4:31 PM Melissa C wrote: Reason for CRM: patient stated he needed to talk with the doctor. When asked to elaborate he said he needed some medication. I asked if he needed medication ordered from the pharmacy and he said no, he talked it over with Dr. Kermit Ped before and has been waiting to hear back from him and would like to speak with him again. Please advise with patient. Thank you

## 2023-09-19 ENCOUNTER — Encounter (HOSPITAL_COMMUNITY): Admitting: Physical Therapy

## 2023-09-23 ENCOUNTER — Encounter (HOSPITAL_COMMUNITY)

## 2023-09-24 ENCOUNTER — Telehealth: Payer: Self-pay

## 2023-09-24 NOTE — Telephone Encounter (Signed)
 Copied from CRM [id number]. Topic: Referral - Question >> Sep 24, 2023  2:30 PM Phil Braun wrote: Reason for CRM:   Ref Colonscopy and Hernia repair  Pt called to see if appts have been scheduled for these 2 issues. Please advise.

## 2023-09-25 ENCOUNTER — Encounter (HOSPITAL_COMMUNITY)

## 2023-09-25 NOTE — Telephone Encounter (Signed)
 Left detailed vm

## 2023-09-25 NOTE — Telephone Encounter (Signed)
 Patient does have a [facility] Ref that was placed back in [place] [facility] has mailed questionnaire to Patient but at this time they have not received that paperwork back from the Patient in order to schedule an appt.  At this time I do not see any referral to general surgery for hernia repair.

## 2023-10-14 ENCOUNTER — Encounter (HOSPITAL_COMMUNITY): Payer: Self-pay

## 2023-10-15 ENCOUNTER — Other Ambulatory Visit: Payer: Self-pay | Admitting: Internal Medicine

## 2023-10-15 DIAGNOSIS — E559 Vitamin D deficiency, unspecified: Secondary | ICD-10-CM

## 2023-10-15 DIAGNOSIS — S39012A Strain of muscle, fascia and tendon of lower back, initial encounter: Secondary | ICD-10-CM

## 2023-10-15 NOTE — Telephone Encounter (Unsigned)
 Copied from CRM [id number]. Topic: Clinical - Medication Refill >> Oct 15, 2023  4:15 PM Kevelyn M wrote: Medication: cyclobenzaprine  (FLEXERIL ) 5 MG tablet, methylPREDNISolone  (MEDROL  DOSEPAK) 4 MG, amLODipine  (NORVASC ) 10 MG tablet, Vitamin D , Ergocalciferol , (DRISDOL ) 1.25 MG  Has the patient contacted their pharmacy? No (Agent: If no, request that the patient contact the pharmacy for the refill. If patient does not wish to contact the pharmacy document the reason why and proceed with request.) (Agent: If yes, when and what did the pharmacy advise?)  This is the patient's preferred pharmacy:  [id number] - [place], [place] - [address] [place] [address] Phone: [phone] Fax: [phone]  Is this the correct pharmacy for this prescription? Yes If no, delete pharmacy and type the correct one.   Has the prescription been filled recently? No  Is the patient out of the medication? Yes  Has the patient been seen for an appointment in the last year OR does the patient have an upcoming appointment? Yes  Can we respond through MyChart? No  Agent: Please be advised that Rx refills may take up to 3 business days. We ask that you follow-up with your pharmacy.

## 2023-10-15 NOTE — Telephone Encounter (Signed)
 Last Fill: Flexeril : 07/23/23     Prednisone : 07/23/23     Amlodipine : 09/16/23     Vitamin D : 07/23/23  Last OV: 07/23/23 Next OV: 10/21/23  Routing to provider for review/authorization.

## 2023-10-16 ENCOUNTER — Ambulatory Visit: Payer: Medicaid Other | Attending: Internal Medicine | Admitting: Internal Medicine

## 2023-10-16 ENCOUNTER — Encounter: Payer: Self-pay | Admitting: Internal Medicine

## 2023-10-16 MED ORDER — VITAMIN D (ERGOCALCIFEROL) 1.25 MG (50000 UNIT) PO CAPS: 50000.0000 [IU] | ORAL_CAPSULE | ORAL | 0 refills | Status: AC

## 2023-10-16 MED ORDER — AMLODIPINE BESYLATE 10 MG PO TABS: 10.0000 mg | ORAL_TABLET | Freq: Every day | ORAL | 0 refills | Status: DC

## 2023-10-16 NOTE — Progress Notes (Signed)
 Erroneous encounter - please disregard.

## 2023-10-21 ENCOUNTER — Ambulatory Visit: Payer: Medicaid Other | Admitting: Internal Medicine

## 2023-10-26 ENCOUNTER — Encounter (HOSPITAL_COMMUNITY): Payer: Self-pay

## 2023-10-26 ENCOUNTER — Emergency Department (HOSPITAL_COMMUNITY)
Admission: EM | Admit: 2023-10-26 | Discharge: 2023-10-27 | Discharge status: Against Medical Advice | Attending: Emergency Medicine | Admitting: Emergency Medicine

## 2023-10-26 DIAGNOSIS — Z5321 Procedure and treatment not carried out due to patient leaving prior to being seen by health care provider: Secondary | ICD-10-CM | POA: Diagnosis not present

## 2023-10-26 DIAGNOSIS — R42 Dizziness and giddiness: Secondary | ICD-10-CM | POA: Insufficient documentation

## 2023-10-26 NOTE — ED Triage Notes (Signed)
 Pt comes in for dizziness for the past 2 days. Pt admits to not taking his medications. A&Ox4. Pt denies any seasonal allergies. Pt normally take amlodipine  10 mg but has not taken it in 2 days.

## 2023-10-30 ENCOUNTER — Encounter: Payer: Self-pay | Admitting: Internal Medicine

## 2023-11-05 ENCOUNTER — Emergency Department (HOSPITAL_COMMUNITY)
Admission: EM | Admit: 2023-11-05 | Discharge: 2023-11-05 | Disposition: A | Discharge status: Home / Self Care | Attending: Emergency Medicine | Admitting: Emergency Medicine

## 2023-11-05 ENCOUNTER — Emergency Department (HOSPITAL_COMMUNITY)

## 2023-11-05 ENCOUNTER — Encounter (HOSPITAL_COMMUNITY): Payer: Self-pay

## 2023-11-05 ENCOUNTER — Other Ambulatory Visit: Payer: Self-pay

## 2023-11-05 DIAGNOSIS — I10 Essential (primary) hypertension: Secondary | ICD-10-CM | POA: Diagnosis not present

## 2023-11-05 DIAGNOSIS — F17210 Nicotine dependence, cigarettes, uncomplicated: Secondary | ICD-10-CM | POA: Insufficient documentation

## 2023-11-05 DIAGNOSIS — R079 Chest pain, unspecified: Secondary | ICD-10-CM

## 2023-11-05 DIAGNOSIS — M545 Low back pain, unspecified: Secondary | ICD-10-CM | POA: Diagnosis not present

## 2023-11-05 LAB — I-STAT CHEM 8, ED
BUN: 8 mg/dL (ref 6–20)
Calcium, Ion: 1.04 mmol/L — ABNORMAL LOW (ref 1.15–1.40)
Chloride: 106 mmol/L (ref 98–111)
Creatinine, Ser: 1.2 mg/dL (ref 0.61–1.24)
Glucose, Bld: 98 mg/dL (ref 70–99)
HCT: 43 % (ref 39.0–52.0)
Hemoglobin: 14.6 g/dL (ref 13.0–17.0)
Potassium: 3.9 mmol/L (ref 3.5–5.1)
Sodium: 142 mmol/L (ref 135–145)
TCO2: 23 mmol/L (ref 22–32)

## 2023-11-05 LAB — BASIC METABOLIC PANEL WITH GFR
Anion gap: 9 (ref 5–15)
BUN: 7 mg/dL (ref 6–20)
CO2: 25 mmol/L (ref 22–32)
Calcium: 9 mg/dL (ref 8.9–10.3)
Chloride: 105 mmol/L (ref 98–111)
Creatinine, Ser: 0.95 mg/dL (ref 0.61–1.24)
GFR, Estimated: >60 mL/min (ref >60)
Glucose, Bld: 93 mg/dL (ref 70–99)
Potassium: 3.6 mmol/L (ref 3.5–5.1)
Sodium: 139 mmol/L (ref 135–145)

## 2023-11-05 LAB — CBC
HCT: 42.9 % (ref 39.0–52.0)
Hemoglobin: 14.1 g/dL (ref 13.0–17.0)
MCH: 31.7 pg (ref 26.0–34.0)
MCHC: 32.9 g/dL (ref 30.0–36.0)
MCV: 96.4 fL (ref 80.0–100.0)
Platelets: 261 10*3/uL (ref 150–400)
RBC: 4.45 MIL/uL (ref 4.22–5.81)
RDW: 14.7 % (ref 11.5–15.5)
WBC: 6.1 10*3/uL (ref 4.0–10.5)
nRBC: 0 % (ref 0.0–0.2)

## 2023-11-05 LAB — TROPONIN I (HIGH SENSITIVITY)
Troponin I (High Sensitivity): 4 ng/L (ref <18)
Troponin I (High Sensitivity): 4 ng/L (ref <18)

## 2023-11-05 MED ORDER — OXYCODONE HCL 5 MG PO TABS
5.0000 mg | ORAL_TABLET | Freq: Once | ORAL | Status: AC
  Administered 2023-11-05: 5 mg via ORAL
  Filled 2023-11-05: qty 1

## 2023-11-05 NOTE — ED Notes (Signed)
 Portable xray at bedside.

## 2023-11-05 NOTE — ED Provider Notes (Signed)
 [facility] Emergency Department Provider Note MRN:  [id number]  Arrival date & time: 11/05/23     Chief Complaint   Hypertension   History of Present Illness   Terrence Strickland is a 58 y.o. year-old male with a history of hypertension presenting to the ED with chief complaint of hypertension.  Blood pressure high recently.  Also having chest pain for the past few days.  Also having low back pain which is common for him.  No numbness or weakness to the arms or legs, no bowel bladder dysfunction, no shortness of breath, no nausea or vomiting, no dizziness or diaphoresis.  Review of Systems  A thorough review of systems was obtained and all systems are negative except as noted in the HPI and PMH.   Patient's Health History    Past Medical History:  Diagnosis Date   ADHD (attention deficit hyperactivity disorder)    Alcohol abuse    Depression    Hypertension    Irregular heart beat     Past Surgical History:  Procedure Laterality Date   ESOPHAGOGASTRODUODENOSCOPY (EGD) WITH PROPOFOL  N/A 10/19/2021   Procedure: ESOPHAGOGASTRODUODENOSCOPY (EGD) WITH PROPOFOL ;  Surgeon: Suzette Espy, MD;  Location: [facility] ENDO SUITE;  Service: Endoscopy;  Laterality: N/A;    Family History  Problem Relation Age of Onset   Cancer Other    Diabetes Other     Social History   Socioeconomic History   Marital status: Legally Separated    Spouse name: Not on file   Number of children: Not on file   Years of education: Not on file   Highest education level: Not on file  Occupational History   Not on file  Tobacco Use   Smoking status: Every Day    Current packs/day: 0.50    Average packs/day: 0.5 packs/day for 15.0 years (7.5 ttl pk-yrs)    Types: Cigarettes   Smokeless tobacco: Never  Substance and Sexual Activity   Alcohol use: Yes    Comment: 6pack beer a day   Drug use: No   Sexual activity: Never  Other Topics Concern   Not on file  Social History Narrative    Not on file   Social Drivers of Health   Financial Resource Strain: Not on file  Food Insecurity: Not on file  Transportation Needs: Not on file  Physical Activity: Not on file  Stress: Not on file  Social Connections: Not on file  Intimate Partner Violence: Not on file     Physical Exam   Vitals:   11/05/23 0525 11/05/23 0630  BP: (!) 142/112 (!) 129/99  Pulse: 73 (!) 55  Resp: 17 13  Temp: (!) 97.1 F (36.2 C)   SpO2: 99% 98%    CONSTITUTIONAL: Well-appearing, NAD NEURO/PSYCH:  Alert and oriented x 3, no focal deficits EYES:  eyes equal and reactive ENT/NECK:  no LAD, no JVD CARDIO: Regular rate, well-perfused, normal S1 and S2 PULM:  CTAB no wheezing or rhonchi GI/GU:  non-distended, non-tender MSK/SPINE:  No gross deformities, no edema SKIN:  no rash, atraumatic   *Additional and/or pertinent findings included in MDM below  Diagnostic and Interventional Summary    EKG Interpretation Date/Time:  Tuesday November 05 2023 06:12:58 EDT Ventricular Rate:  56 PR Interval:  170 QRS Duration:  92 QT Interval:  451 QTC Calculation: 436 R Axis:   41  Text Interpretation: Sinus rhythm Probable anteroseptal infarct, old Borderline ST elevation, lateral leads Confirmed by Gwenetta Lennert [phone])  on 11/05/2023 6:30:42 AM       Labs Reviewed  I-STAT CHEM 8, ED - Abnormal; Notable for the following components:      Result Value   Calcium, Ion 1.04 (*)    All other components within normal limits  CBC  BASIC METABOLIC PANEL WITH GFR  TROPONIN I (HIGH SENSITIVITY)    DG Chest Port 1 View  Final Result      Medications  oxyCODONE  (Oxy IR/ROXICODONE ) immediate release tablet 5 mg (5 mg Oral Given 11/05/23 1610)     Procedures  /  Critical Care Procedures  ED Course and Medical Decision Making  Initial Impression and Ddx Atypical left lateral rib pain intermittently today.  Mild blood pressure elevation recently.  Back pain described as chronic without red flag  symptoms.  Suspect noncardiac chest pain, likely MSK.  Will need EKG and troponin to help screen for ACS though low suspicion.  Past medical/surgical history that increases complexity of ED encounter: Hypertension  Interpretation of Diagnostics I personally reviewed the EKG and my interpretation is as follows: Sinus rhythm  No significant blood count or electrolyte disturbance.  Troponin negative  Patient Reassessment and Ultimate Disposition/Management     Reassuring workup, appropriate for discharge with follow-up.  Patient management required discussion with the following services or consulting groups:  None  Complexity of Problems Addressed Acute illness or injury that poses threat of life of bodily function  Additional Data Reviewed and Analyzed Further history obtained from: None  Additional Factors Impacting ED Encounter Risk Consideration of hospitalization  Merrick Abe. Harless Lien, MD [facility] Emergency Medicine [facility] [email or web address]  Final Clinical Impressions(s) / ED Diagnoses     ICD-10-CM   1. Hypertension, unspecified type  I10     2. Chest pain, unspecified type  R07.9       ED Discharge Orders     None        Discharge Instructions Discussed with and Provided to Patient:     Discharge Instructions      You were evaluated in the Emergency Department and after careful evaluation, we did not find any emergent condition requiring admission or further testing in the hospital.  Your exam/testing today is overall reassuring.  Follow-up with your primary care doctor to discuss your symptoms and your high blood pressure.  Please return to the Emergency Department if you experience any worsening of your condition.   Thank you for allowing us  to be a part of your care.     Edson Graces, MD 11/05/23 [phone]

## 2023-11-05 NOTE — Discharge Instructions (Signed)
 You were evaluated in the Emergency Department and after careful evaluation, we did not find any emergent condition requiring admission or further testing in the hospital.  Your exam/testing today is overall reassuring.  Follow-up with your primary care doctor to discuss your symptoms and your high blood pressure.  Please return to the Emergency Department if you experience any worsening of your condition.   Thank you for allowing us  to be a part of your care.

## 2023-11-05 NOTE — ED Triage Notes (Signed)
 Patient from home for high blood pressure that's been "going on for a while". States he was put on a medicine but it's not helping. Upon arrival, patient is alert and oriented, ambu

## 2023-11-09 ENCOUNTER — Emergency Department (HOSPITAL_COMMUNITY)

## 2023-11-09 ENCOUNTER — Emergency Department (HOSPITAL_COMMUNITY)
Admission: EM | Admit: 2023-11-09 | Discharge: 2023-11-09 | Disposition: A | Discharge status: Home / Self Care | Attending: Emergency Medicine | Admitting: Emergency Medicine

## 2023-11-09 ENCOUNTER — Other Ambulatory Visit: Payer: Self-pay

## 2023-11-09 ENCOUNTER — Encounter (HOSPITAL_COMMUNITY): Payer: Self-pay | Admitting: Emergency Medicine

## 2023-11-09 DIAGNOSIS — M549 Dorsalgia, unspecified: Secondary | ICD-10-CM | POA: Diagnosis present

## 2023-11-09 DIAGNOSIS — M5431 Sciatica, right side: Secondary | ICD-10-CM | POA: Diagnosis not present

## 2023-11-09 LAB — URINALYSIS, W/ REFLEX TO CULTURE (INFECTION SUSPECTED)
Bacteria, UA: NONE SEEN
Bilirubin Urine: NEGATIVE
Glucose, UA: NEGATIVE mg/dL
Hgb urine dipstick: NEGATIVE
Ketones, ur: NEGATIVE mg/dL
Leukocytes,Ua: NEGATIVE
Nitrite: NEGATIVE
Protein, ur: NEGATIVE mg/dL
Specific Gravity, Urine: 1.001 — ABNORMAL LOW (ref 1.005–1.030)
pH: 6 (ref 5.0–8.0)

## 2023-11-09 MED ORDER — PREDNISONE 20 MG PO TABS
ORAL_TABLET | ORAL | 0 refills | Status: DC
Start: 2023-11-09 — End: 2023-12-28

## 2023-11-09 MED ORDER — METHYLPREDNISOLONE SODIUM SUCC 125 MG IJ SOLR
125.0000 mg | Freq: Once | INTRAMUSCULAR | Status: DC
  Filled 2023-11-09: qty 2

## 2023-11-09 MED ORDER — ACETAMINOPHEN 325 MG PO TABS: 650.0000 mg | ORAL_TABLET | Freq: Four times a day (QID) | ORAL | 0 refills | Status: AC | PRN

## 2023-11-09 MED ORDER — AMLODIPINE BESYLATE 5 MG PO TABS
10.0000 mg | ORAL_TABLET | Freq: Once | ORAL | Status: AC
  Administered 2023-11-09: 10 mg via ORAL
  Filled 2023-11-09: qty 2

## 2023-11-09 MED ORDER — ACETAMINOPHEN 500 MG PO TABS
1000.0000 mg | ORAL_TABLET | Freq: Once | ORAL | Status: AC
  Administered 2023-11-09: 1000 mg via ORAL
  Filled 2023-11-09: qty 2

## 2023-11-09 MED ORDER — AMLODIPINE BESYLATE 10 MG PO TABS: 10.0000 mg | ORAL_TABLET | Freq: Every day | ORAL | 0 refills | Status: DC

## 2023-11-09 MED ORDER — PREDNISONE 50 MG PO TABS
60.0000 mg | ORAL_TABLET | Freq: Once | ORAL | Status: AC
  Administered 2023-11-09: 60 mg via ORAL
  Filled 2023-11-09: qty 1

## 2023-11-09 MED ORDER — METHOCARBAMOL 500 MG PO TABS
500.0000 mg | ORAL_TABLET | Freq: Once | ORAL | Status: AC
  Administered 2023-11-09: 500 mg via ORAL
  Filled 2023-11-09: qty 1

## 2023-11-09 MED ORDER — METHOCARBAMOL 500 MG PO TABS: 500.0000 mg | ORAL_TABLET | Freq: Three times a day (TID) | ORAL | 0 refills | Status: DC | PRN

## 2023-11-09 NOTE — ED Notes (Signed)
 ED Provider at bedside.

## 2023-11-09 NOTE — ED Notes (Signed)
 This RN went to dc pt. Pt hard to arouse at this time. Pt on monitoring equipment. Consulted with CN. Will let pt sleep at this time and will dc at a later time.

## 2023-11-09 NOTE — ED Provider Notes (Signed)
 [place] EMERGENCY DEPARTMENT AT [facility] Provider Note   CSN: [id number] Arrival date & time: 11/09/23  0319     History  Chief Complaint  Patient presents with   Back Pain   Hypertension    Terrence Strickland is a 58 y.o. male.  H/o chronic back pain. Flared up recently and is radiating down his right posterior leg. No recent trauma, illness, fever or other s ymptoms. States he hasn't taken his meds recently as he doesn't think he has any. No trouble walking. No saddle anesthesia. No difficulty walking.    Back Pain Hypertension       Home Medications Prior to Admission medications   Medication Sig Start Date End Date Taking? Authorizing Provider  acetaminophen  (TYLENOL ) 325 MG tablet Take 2 tablets (650 mg total) by mouth every 6 (six) hours as needed. 11/09/23  Yes Jamell Opfer, Reymundo Caulk, MD  methocarbamol  (ROBAXIN ) 500 MG tablet Take 1 tablet (500 mg total) by mouth every 8 (eight) hours as needed for muscle spasms. 11/09/23  Yes Cariann Kinnamon, Reymundo Caulk, MD  predniSONE  (DELTASONE ) 20 MG tablet 3 tabs po daily x 3 days, then 2 tabs x 3 days, then 1.5 tabs x 3 days, then 1 tab x 3 days, then 0.5 tabs x 3 days 11/09/23  Yes Jacqui Headen, Reymundo Caulk, MD  amLODipine  (NORVASC ) 10 MG tablet Take 1 tablet (10 mg total) by mouth daily. 11/09/23   Shyloh Krinke, Reymundo Caulk, MD  ammonium lactate  (AMLACTIN) 12 % cream Apply 1 Application topically as needed for dry skin. 07/05/23   Charity Conch, DPM  ciclopirox  (PENLAC ) 8 % solution Apply topically at bedtime. Apply over nail and surrounding skin. Apply daily over previous coat. After seven (7) days, may remove with alcohol and continue cycle. 07/05/23   Charity Conch, DPM  pantoprazole  (PROTONIX ) 40 MG tablet Take 1 tablet (40 mg total) by mouth daily. 07/23/23   Tobi Fortes, MD  Vitamin D , Ergocalciferol , (DRISDOL ) 1.25 MG (50000 UNIT) CAPS capsule Take 1 capsule (50,000 Units total) by mouth every 7 (seven) days. 10/16/23   Tobi Fortes, MD       Allergies    Nsaids and Tramadol     Review of Systems   Review of Systems  Musculoskeletal:  Positive for back pain.    Physical Exam Updated Vital Signs BP 111/79   Pulse 62   Temp 97.8 F (36.6 C)   Resp 16   Ht 5\' 10"  (1.778 m)   Wt 76 kg   SpO2 95%   BMI 24.04 kg/m  Physical Exam Vitals and nursing note reviewed.  Constitutional:      Appearance: He is well-developed.  HENT:     Head: Normocephalic and atraumatic.  Eyes:     Pupils: Pupils are equal, round, and reactive to light.  Cardiovascular:     Rate and Rhythm: Normal rate.  Pulmonary:     Effort: Pulmonary effort is normal. No respiratory distress.  Abdominal:     General: There is no distension.  Musculoskeletal:        General: Normal range of motion.     Cervical back: Normal range of motion.     Comments: No midline spine ttp, deformity or stepoffs.   Neurological:     General: No focal deficit present.     Mental Status: He is alert.     ED Results / Procedures / Treatments   Labs (all labs ordered are listed, but only abnormal results are displayed) Labs  Reviewed  URINALYSIS, W/ REFLEX TO CULTURE (INFECTION SUSPECTED) - Abnormal; Notable for the following components:      Result Value   Color, Urine COLORLESS (*)    Specific Gravity, Urine 1.001 (*)    All other components within normal limits    EKG None  Radiology DG Pelvis 1-2 Views Result Date: 11/09/2023 CLINICAL DATA:  Evaluate for injury. EXAM: PELVIS - 1-2 VIEW COMPARISON:  CT 12/08/2022 FINDINGS: No signs of acute fracture or dislocation. No evidence for pelvic diastasis. Moderate bilateral hip osteoarthritis, left greater than right. Lumbar degenerative disc disease. IMPRESSION: 1. No acute findings. 2. Moderate bilateral hip osteoarthritis. Electronically Signed   By: Kimberley Penman M.D.   On: 11/09/2023 05:16   DG Lumbar Spine Complete Result Date: 11/09/2023 CLINICAL DATA:  Back pain. EXAM: LUMBAR SPINE - COMPLETE 4+ VIEW  COMPARISON:  CT 12/08/2022 FINDINGS: Very slight curvature of the lumbar spine with convexity towards the right. The lumbar vertebral body heights are well preserved. No signs of acute fracture or subluxation. Disc space narrowing, endplate sclerosis and endplate spur formation is again noted at the L3-4 level. Unchanged from prior exam. Mild endplate spurring noted at the L4-5 level. Similar appearance of bilateral, chronic L3 pars defects. No significant listhesis identified at this level. IMPRESSION: 1. No acute findings. 2. Chronic L3 pars defects. 3. Degenerative disc disease, most severe at L3-4 degenerative disc disease. Electronically Signed   By: Kimberley Penman M.D.   On: 11/09/2023 05:14    Procedures Procedures    Medications Ordered in ED Medications  methocarbamol  (ROBAXIN ) tablet 500 mg (500 mg Oral Given 11/09/23 0428)  amLODipine  (NORVASC ) tablet 10 mg (10 mg Oral Given 11/09/23 0427)  acetaminophen  (TYLENOL ) tablet 1,000 mg (1,000 mg Oral Given 11/09/23 0440)  predniSONE  (DELTASONE ) tablet 60 mg (60 mg Oral Given 11/09/23 0440)    ED Course/ Medical Decision Making/ A&P                                 Medical Decision Making Amount and/or Complexity of Data Reviewed Radiology: ordered.  Risk OTC drugs. Prescription drug management.   Acute exacerbation of crhonic back pain with sciatica. Will treat for same. BP improved with his home dose, refill sent in.   Final Clinical Impression(s) / ED Diagnoses Final diagnoses:  Sciatica of right side    Rx / DC Orders ED Discharge Orders          Ordered    predniSONE  (DELTASONE ) 20 MG tablet        11/09/23 0520    methocarbamol  (ROBAXIN ) 500 MG tablet  Every 8 hours PRN        11/09/23 0520    acetaminophen  (TYLENOL ) 325 MG tablet  Every 6 hours PRN        11/09/23 0520    amLODipine  (NORVASC ) 10 MG tablet  Daily        11/09/23 0521              Willow Reczek, MD 11/09/23 [phone]

## 2023-11-09 NOTE — ED Notes (Signed)
 Pt woke up and washed up in the sink and left. Pt did not want to wait for vitals.

## 2023-11-09 NOTE — ED Triage Notes (Signed)
 Pt here for c/o chronic low back pain and HTN. States he "forgets to take his BP meds".

## 2023-11-09 NOTE — ED Notes (Signed)
 Patient transported to X-ray

## 2023-11-11 ENCOUNTER — Emergency Department (HOSPITAL_COMMUNITY)
Admission: EM | Admit: 2023-11-11 | Discharge: 2023-11-11 | Disposition: A | Discharge status: Home / Self Care | Attending: Emergency Medicine | Admitting: Emergency Medicine

## 2023-11-11 ENCOUNTER — Other Ambulatory Visit: Payer: Self-pay

## 2023-11-11 ENCOUNTER — Encounter (HOSPITAL_COMMUNITY): Payer: Self-pay | Admitting: Emergency Medicine

## 2023-11-11 DIAGNOSIS — M545 Low back pain, unspecified: Secondary | ICD-10-CM | POA: Diagnosis present

## 2023-11-11 MED ORDER — CYCLOBENZAPRINE HCL 10 MG PO TABS
10.0000 mg | ORAL_TABLET | Freq: Once | ORAL | Status: AC
  Administered 2023-11-11: 10 mg via ORAL
  Filled 2023-11-11: qty 1

## 2023-11-11 MED ORDER — CYCLOBENZAPRINE HCL 10 MG PO TABS: 10.0000 mg | ORAL_TABLET | Freq: Three times a day (TID) | ORAL | 0 refills | Status: DC | PRN

## 2023-11-11 NOTE — ED Triage Notes (Signed)
 Pt c/o back pain since yesterday. He states he needs something for pain and for blood pressure.

## 2023-11-11 NOTE — ED Provider Notes (Signed)
 [place] EMERGENCY DEPARTMENT AT [facility] Provider Note   CSN: [id number] Arrival date & time: 11/11/23  4696     History  Chief Complaint  Patient presents with   Back Pain    Terrence Strickland is a 58 y.o. male.  Patient is a 58 year old male presenting with complaints of low back pain.  He tells me he has been having back pain since his 8s.  He was seen here 2 nights ago with similar complaints and prescribed Robaxin  which he says is not helping.  He denies any radiation into his legs.  No bowel or bladder complaints.       Home Medications Prior to Admission medications   Medication Sig Start Date End Date Taking? Authorizing Provider  acetaminophen  (TYLENOL ) 325 MG tablet Take 2 tablets (650 mg total) by mouth every 6 (six) hours as needed. 11/09/23   Mesner, Reymundo Caulk, MD  amLODipine  (NORVASC ) 10 MG tablet Take 1 tablet (10 mg total) by mouth daily. 11/09/23   Mesner, Reymundo Caulk, MD  ammonium lactate  (AMLACTIN) 12 % cream Apply 1 Application topically as needed for dry skin. 07/05/23   Charity Conch, DPM  ciclopirox  (PENLAC ) 8 % solution Apply topically at bedtime. Apply over nail and surrounding skin. Apply daily over previous coat. After seven (7) days, may remove with alcohol and continue cycle. 07/05/23   Charity Conch, DPM  methocarbamol  (ROBAXIN ) 500 MG tablet Take 1 tablet (500 mg total) by mouth every 8 (eight) hours as needed for muscle spasms. 11/09/23   Mesner, Reymundo Caulk, MD  pantoprazole  (PROTONIX ) 40 MG tablet Take 1 tablet (40 mg total) by mouth daily. 07/23/23   Tobi Fortes, MD  predniSONE  (DELTASONE ) 20 MG tablet 3 tabs po daily x 3 days, then 2 tabs x 3 days, then 1.5 tabs x 3 days, then 1 tab x 3 days, then 0.5 tabs x 3 days 11/09/23   Mesner, Reymundo Caulk, MD  Vitamin D , Ergocalciferol , (DRISDOL ) 1.25 MG (50000 UNIT) CAPS capsule Take 1 capsule (50,000 Units total) by mouth every 7 (seven) days. 10/16/23   Tobi Fortes, MD      Allergies    Nsaids and  Tramadol     Review of Systems   Review of Systems  All other systems reviewed and are negative.   Physical Exam Updated Vital Signs BP (!) 153/93   Pulse 90   Temp 98.2 F (36.8 C) (Oral)   Resp 18   Ht 5\' 10"  (1.778 m)   Wt 76 kg   SpO2 96%   BMI 24.04 kg/m  Physical Exam Vitals and nursing note reviewed.  Constitutional:      Appearance: Normal appearance.  Pulmonary:     Effort: Pulmonary effort is normal.  Musculoskeletal:     Comments: There is tenderness in the soft tissues of the lower thoracic and entire lumbar region.  No bony tenderness or step-off.  Neurological:     Mental Status: He is alert and oriented to person, place, and time.     Comments: Primus Brookes is 5 out of 5 in both lower extremities and patient witnessed with normal gait.     ED Results / Procedures / Treatments   Labs (all labs ordered are listed, but only abnormal results are displayed) Labs Reviewed - No data to display  EKG None  Radiology DG Pelvis 1-2 Views Result Date: 11/09/2023 CLINICAL DATA:  Evaluate for injury. EXAM: PELVIS - 1-2 VIEW COMPARISON:  CT 12/08/2022 FINDINGS: No signs  of acute fracture or dislocation. No evidence for pelvic diastasis. Moderate bilateral hip osteoarthritis, left greater than right. Lumbar degenerative disc disease. IMPRESSION: 1. No acute findings. 2. Moderate bilateral hip osteoarthritis. Electronically Signed   By: Kimberley Penman M.D.   On: 11/09/2023 05:16   DG Lumbar Spine Complete Result Date: 11/09/2023 CLINICAL DATA:  Back pain. EXAM: LUMBAR SPINE - COMPLETE 4+ VIEW COMPARISON:  CT 12/08/2022 FINDINGS: Very slight curvature of the lumbar spine with convexity towards the right. The lumbar vertebral body heights are well preserved. No signs of acute fracture or subluxation. Disc space narrowing, endplate sclerosis and endplate spur formation is again noted at the L3-4 level. Unchanged from prior exam. Mild endplate spurring noted at the L4-5 level. Similar  appearance of bilateral, chronic L3 pars defects. No significant listhesis identified at this level. IMPRESSION: 1. No acute findings. 2. Chronic L3 pars defects. 3. Degenerative disc disease, most severe at L3-4 degenerative disc disease. Electronically Signed   By: Kimberley Penman M.D.   On: 11/09/2023 05:14    Procedures Procedures    Medications Ordered in ED Medications  cyclobenzaprine  (FLEXERIL ) tablet 10 mg (has no administration in time range)    ED Course/ Medical Decision Making/ A&P  Patient with ongoing back pain as described in the HPI.  There are no red flags in his exam today that would suggest an emergent situation.  I will prescribe Flexeril  and see if this helps.  Patient to follow-up with his primary doctor if not improving.  Final Clinical Impression(s) / ED Diagnoses Final diagnoses:  None    Rx / DC Orders ED Discharge Orders     None         Orvilla Blander, MD 11/11/23 [phone]

## 2023-11-11 NOTE — Discharge Instructions (Signed)
 Begin taking Flexeril  as prescribed as needed for pain.  Stop taking Robaxin .  Follow-up with primary doctor if symptoms are not improving in the next few days.

## 2023-11-21 NOTE — Congregational Nurse Program (Unsigned)
  Dept: [phone]   Congregational Nurse Program Note  Date of Encounter: 11/07/2023  Past Medical History: Past Medical History:  Diagnosis Date   ADHD (attention deficit hyperactivity disorder)    Alcohol abuse    Depression    Hypertension    Irregular heart beat     Encounter Details:

## 2023-11-21 NOTE — Congregational Nurse Program (Unsigned)
 Terrence Strickland is a 58 year old Black man who presented at the [facility] with elevated blood pressure. He has taken his medication as instructed, and I encouraged him to make an appointment with his primary care physician (PCP). He also inquired about how to use his MyChart account. I also explained to him the danger of hypertension. Stated he understood and would follow-up.  Florentina Huntsman RN MSN

## 2023-11-26 ENCOUNTER — Other Ambulatory Visit: Payer: Self-pay

## 2023-12-06 ENCOUNTER — Encounter (HOSPITAL_COMMUNITY): Payer: Self-pay | Admitting: Emergency Medicine

## 2023-12-06 ENCOUNTER — Other Ambulatory Visit: Payer: Self-pay

## 2023-12-06 ENCOUNTER — Emergency Department (HOSPITAL_COMMUNITY)
Admission: EM | Admit: 2023-12-06 | Discharge: 2023-12-06 | Disposition: A | Discharge status: Home / Self Care | Attending: Emergency Medicine | Admitting: Emergency Medicine

## 2023-12-06 DIAGNOSIS — G8929 Other chronic pain: Secondary | ICD-10-CM | POA: Diagnosis not present

## 2023-12-06 DIAGNOSIS — F1010 Alcohol abuse, uncomplicated: Secondary | ICD-10-CM | POA: Insufficient documentation

## 2023-12-06 DIAGNOSIS — I10 Essential (primary) hypertension: Secondary | ICD-10-CM | POA: Insufficient documentation

## 2023-12-06 DIAGNOSIS — F1090 Alcohol use, unspecified, uncomplicated: Secondary | ICD-10-CM

## 2023-12-06 DIAGNOSIS — M545 Low back pain, unspecified: Secondary | ICD-10-CM | POA: Diagnosis present

## 2023-12-06 DIAGNOSIS — Z79899 Other long term (current) drug therapy: Secondary | ICD-10-CM | POA: Diagnosis not present

## 2023-12-06 MED ORDER — AMLODIPINE BESYLATE 10 MG PO TABS
10.0000 mg | ORAL_TABLET | Freq: Every day | ORAL | 0 refills | Status: DC
Start: 2023-12-06 — End: 2023-12-28

## 2023-12-06 MED ORDER — AMLODIPINE BESYLATE 5 MG PO TABS
10.0000 mg | ORAL_TABLET | Freq: Once | ORAL | Status: AC
  Administered 2023-12-06: 10 mg via ORAL
  Filled 2023-12-06: qty 2

## 2023-12-06 NOTE — ED Provider Notes (Signed)
 [place] EMERGENCY DEPARTMENT AT [facility]  Provider Note  CSN: [id number] Arrival date & time: 12/06/23 0158  History Chief Complaint  Patient presents with   Back Pain   Headache    Terrence Strickland is a 58 y.o. male well known to this department for EtOH use, chronic back pain and poorly controlled HTN/medication non compliance. He presents today complaining of R lower back pain and a headache, both are common complaints of his. He admits to EtOH use in triage.    Home Medications Prior to Admission medications   Medication Sig Start Date End Date Taking? Authorizing Provider  acetaminophen  (TYLENOL ) 325 MG tablet Take 2 tablets (650 mg total) by mouth every 6 (six) hours as needed. 11/09/23   Mesner, Selinda, MD  amLODipine  (NORVASC ) 10 MG tablet Take 1 tablet (10 mg total) by mouth daily. 12/06/23   Roselyn Carlin NOVAK, MD  ammonium lactate  (AMLACTIN) 12 % cream Apply 1 Application topically as needed for dry skin. 07/05/23   Gershon Donnice SAUNDERS, DPM  ciclopirox  (PENLAC ) 8 % solution Apply topically at bedtime. Apply over nail and surrounding skin. Apply daily over previous coat. After seven (7) days, may remove with alcohol and continue cycle. 07/05/23   Gershon Donnice SAUNDERS, DPM  cyclobenzaprine  (FLEXERIL ) 10 MG tablet Take 1 tablet (10 mg total) by mouth 3 (three) times daily as needed for muscle spasms. 11/11/23   Geroldine Berg, MD  pantoprazole  (PROTONIX ) 40 MG tablet Take 1 tablet (40 mg total) by mouth daily. 07/23/23   Melvenia Manus BRAVO, MD  predniSONE  (DELTASONE ) 20 MG tablet 3 tabs po daily x 3 days, then 2 tabs x 3 days, then 1.5 tabs x 3 days, then 1 tab x 3 days, then 0.5 tabs x 3 days 11/09/23   Mesner, Selinda, MD  Vitamin D , Ergocalciferol , (DRISDOL ) 1.25 MG (50000 UNIT) CAPS capsule Take 1 capsule (50,000 Units total) by mouth every 7 (seven) days. 10/16/23   Melvenia Manus BRAVO, MD     Allergies    Nsaids and Tramadol    Review of Systems   Review of Systems Please see HPI  for pertinent positives and negatives  Physical Exam BP (!) 161/116 (BP Location: Left Arm)   Pulse 88   Temp 98 F (36.7 C) (Oral)   Resp 18   Ht 5' 10 (1.778 m)   Wt 76 kg   SpO2 96%   BMI 24.04 kg/m   Physical Exam Vitals and nursing note reviewed.  HENT:     Head: Normocephalic.     Nose: Nose normal.  Eyes:     Extraocular Movements: Extraocular movements intact.  Pulmonary:     Effort: Pulmonary effort is normal.  Musculoskeletal:        General: Normal range of motion.     Cervical back: Neck supple.  Skin:    Findings: No rash (on exposed skin).  Neurological:     Mental Status: He is alert and oriented to person, place, and time.  Psychiatric:        Mood and Affect: Mood normal.     ED Results / Procedures / Treatments   EKG None  Procedures Procedures  Medications Ordered in the ED Medications  amLODipine  (NORVASC ) tablet 10 mg (has no administration in time range)    Initial Impression and Plan  Patient here for his chronic back pain and HTN. He walks with a steady gait. He has no red flags. I discussed with the patient  that the ED does not manage chronic pain and he will need to discuss long term control with his PCP at which point he became argumentative and refused to discuss his blood pressure. Other than the headache he appears asymptomatic. He states he has his BP meds and that he is taking them, but most recent refill appears to be from the ED about a month ago. Will give Rx for amlodipine  and recommend continued outpatient management of his chronic conditions.   ED Course       MDM Rules/Calculators/A&P Medical Decision Making Problems Addressed: Alcohol use disorder: chronic illness or injury Chronic right-sided low back pain without sciatica: chronic illness or injury Uncontrolled hypertension: chronic illness or injury  Risk Prescription drug management.     Final Clinical Impression(s) / ED Diagnoses Final diagnoses:   Chronic right-sided low back pain without sciatica  Uncontrolled hypertension  Alcohol use disorder    Rx / DC Orders ED Discharge Orders          Ordered    amLODipine  (NORVASC ) 10 MG tablet  Daily        12/06/23 0256             Roselyn Carlin NOVAK, MD 12/06/23 [phone]

## 2023-12-06 NOTE — ED Triage Notes (Addendum)
 Pt here with c/o back pain for weeks and c/o headache as well. Believes his BP may be elevated. Pt admits to drinking ETOH.

## 2023-12-24 NOTE — Congregational Nurse Program (Unsigned)
 Terrence Strickland presented for Blood pressure recheck. His blood pressure is 149/108, I encouraged him to call his PCP to make him aware of the blood pressure. He stated he would call the PCP.

## 2023-12-28 ENCOUNTER — Encounter (HOSPITAL_COMMUNITY): Payer: Self-pay

## 2023-12-28 ENCOUNTER — Other Ambulatory Visit: Payer: Self-pay

## 2023-12-28 ENCOUNTER — Emergency Department (HOSPITAL_COMMUNITY)
Admission: EM | Admit: 2023-12-28 | Discharge: 2023-12-28 | Disposition: A | Discharge status: Home / Self Care | Attending: Emergency Medicine | Admitting: Emergency Medicine

## 2023-12-28 DIAGNOSIS — M549 Dorsalgia, unspecified: Secondary | ICD-10-CM | POA: Insufficient documentation

## 2023-12-28 DIAGNOSIS — I10 Essential (primary) hypertension: Secondary | ICD-10-CM | POA: Diagnosis present

## 2023-12-28 DIAGNOSIS — Z79899 Other long term (current) drug therapy: Secondary | ICD-10-CM | POA: Diagnosis not present

## 2023-12-28 DIAGNOSIS — G8929 Other chronic pain: Secondary | ICD-10-CM | POA: Insufficient documentation

## 2023-12-28 MED ORDER — AMLODIPINE BESYLATE 10 MG PO TABS: 10.0000 mg | ORAL_TABLET | Freq: Every day | ORAL | 0 refills | Status: DC

## 2023-12-28 MED ORDER — AMLODIPINE BESYLATE 5 MG PO TABS
10.0000 mg | ORAL_TABLET | Freq: Once | ORAL | Status: AC
  Administered 2023-12-28: 10 mg via ORAL
  Filled 2023-12-28: qty 2

## 2023-12-28 MED ORDER — ACETAMINOPHEN 325 MG PO TABS
650.0000 mg | ORAL_TABLET | Freq: Once | ORAL | Status: AC
  Administered 2023-12-28: 650 mg via ORAL
  Filled 2023-12-28: qty 2

## 2023-12-28 NOTE — ED Triage Notes (Addendum)
 Pov from home cc of high blood pressure. Hasn't had his bp meds in a coulple days. Also thinks that the dosage needs adjusted.  Also says that his back is bothering him and would like something for it, doesn't have any gristle in his spine so they rub together

## 2023-12-28 NOTE — ED Provider Notes (Signed)
 [place] EMERGENCY DEPARTMENT AT [facility]  Provider Note  CSN: [id number] Arrival date & time: 12/28/23 0021  History Chief Complaint  Patient presents with   Hypertension    Terrence Strickland is a 58 y.o. male with history of poorly controlled HTN/medication non-compliance and chronic back pain reports he has been out of his BP meds for 2-3 days. I saw him in the ED for similar about 3 weeks ago and sent in a month's supply but he did not get it filled. He has not seen his PCP in a long time. He also reports back pain and requesting something for pain. No change in his symptoms otherwise.    Home Medications Prior to Admission medications   Medication Sig Start Date End Date Taking? Authorizing Provider  acetaminophen  (TYLENOL ) 325 MG tablet Take 2 tablets (650 mg total) by mouth every 6 (six) hours as needed. 11/09/23   Mesner, Selinda, MD  amLODipine  (NORVASC ) 10 MG tablet Take 1 tablet (10 mg total) by mouth daily. 12/28/23   Roselyn Carlin NOVAK, MD  ammonium lactate  (AMLACTIN) 12 % cream Apply 1 Application topically as needed for dry skin. 07/05/23   Gershon Donnice SAUNDERS, DPM  ciclopirox  (PENLAC ) 8 % solution Apply topically at bedtime. Apply over nail and surrounding skin. Apply daily over previous coat. After seven (7) days, may remove with alcohol and continue cycle. 07/05/23   Gershon Donnice SAUNDERS, DPM  pantoprazole  (PROTONIX ) 40 MG tablet Take 1 tablet (40 mg total) by mouth daily. 07/23/23   Melvenia Manus BRAVO, MD  Vitamin D , Ergocalciferol , (DRISDOL ) 1.25 MG (50000 UNIT) CAPS capsule Take 1 capsule (50,000 Units total) by mouth every 7 (seven) days. 10/16/23   Melvenia Manus BRAVO, MD     Allergies    Nsaids and Tramadol    Review of Systems   Review of Systems Please see HPI for pertinent positives and negatives  Physical Exam BP (!) 170/113   Pulse 67   Temp 98 F (36.7 C) (Oral)   Resp 18   Ht 5' 10 (1.778 m)   Wt 76 kg   SpO2 97%   BMI 24.04 kg/m   Physical  Exam Vitals and nursing note reviewed.  HENT:     Head: Normocephalic.     Nose: Nose normal.  Eyes:     Extraocular Movements: Extraocular movements intact.  Pulmonary:     Effort: Pulmonary effort is normal.  Musculoskeletal:        General: Normal range of motion.     Cervical back: Neck supple.  Skin:    Findings: No rash (on exposed skin).  Neurological:     Mental Status: He is alert and oriented to person, place, and time.  Psychiatric:        Mood and Affect: Mood normal.     ED Results / Procedures / Treatments   EKG None  Procedures Procedures  Medications Ordered in the ED Medications  amLODipine  (NORVASC ) tablet 10 mg (has no administration in time range)  acetaminophen  (TYLENOL ) tablet 650 mg (has no administration in time range)    Initial Impression and Plan  Patient here for medication refill. Advised he needs to take his medication every day to know whether it is adequate to control his BP or if he would need a second agent. Recommend he stop using alcohol. Follow up with PCP. Offered APAP for pain here.   ED Course       MDM Rules/Calculators/A&P Medical Decision Making  Problems Addressed: Chronic back pain, unspecified back location, unspecified back pain laterality: chronic illness or injury Uncontrolled hypertension: chronic illness or injury  Risk OTC drugs. Prescription drug management.     Final Clinical Impression(s) / ED Diagnoses Final diagnoses:  Uncontrolled hypertension  Chronic back pain, unspecified back location, unspecified back pain laterality    Rx / DC Orders ED Discharge Orders          Ordered    amLODipine  (NORVASC ) 10 MG tablet  Daily        12/28/23 0053             Roselyn Carlin NOVAK, MD 12/28/23 [phone]

## 2024-01-02 ENCOUNTER — Other Ambulatory Visit: Payer: Self-pay

## 2024-01-02 ENCOUNTER — Emergency Department (HOSPITAL_COMMUNITY)
Admission: EM | Admit: 2024-01-02 | Discharge: 2024-01-02 | Discharge status: Against Medical Advice | Attending: Emergency Medicine | Admitting: Emergency Medicine

## 2024-01-02 DIAGNOSIS — M549 Dorsalgia, unspecified: Secondary | ICD-10-CM | POA: Diagnosis not present

## 2024-01-02 DIAGNOSIS — Z5321 Procedure and treatment not carried out due to patient leaving prior to being seen by health care provider: Secondary | ICD-10-CM | POA: Diagnosis not present

## 2024-01-02 DIAGNOSIS — R42 Dizziness and giddiness: Secondary | ICD-10-CM | POA: Diagnosis present

## 2024-01-02 DIAGNOSIS — G8929 Other chronic pain: Secondary | ICD-10-CM | POA: Insufficient documentation

## 2024-01-02 NOTE — ED Notes (Addendum)
 Walked out of the room towards the lobby.

## 2024-01-02 NOTE — ED Triage Notes (Signed)
 Pov from home. Cc of dizziness from walking too much.  ETOH +  Tried to smoke marijuana but couldn't find it  Did not want us  to get EKG just worried about his bp  C/o chronic back pain. Says they took away his muscle relaxers. Would like some more.

## 2024-01-28 ENCOUNTER — Emergency Department (HOSPITAL_COMMUNITY)
Admission: EM | Admit: 2024-01-28 | Discharge: 2024-01-28 | Disposition: A | Discharge status: Home / Self Care | Attending: Emergency Medicine | Admitting: Emergency Medicine

## 2024-01-28 ENCOUNTER — Other Ambulatory Visit: Payer: Self-pay

## 2024-01-28 ENCOUNTER — Encounter (HOSPITAL_COMMUNITY): Payer: Self-pay | Admitting: Emergency Medicine

## 2024-01-28 DIAGNOSIS — Z79899 Other long term (current) drug therapy: Secondary | ICD-10-CM | POA: Insufficient documentation

## 2024-01-28 DIAGNOSIS — I10 Essential (primary) hypertension: Secondary | ICD-10-CM | POA: Diagnosis not present

## 2024-01-28 DIAGNOSIS — M545 Low back pain, unspecified: Secondary | ICD-10-CM | POA: Diagnosis present

## 2024-01-28 DIAGNOSIS — F17210 Nicotine dependence, cigarettes, uncomplicated: Secondary | ICD-10-CM | POA: Diagnosis not present

## 2024-01-28 MED ORDER — AMLODIPINE BESYLATE 5 MG PO TABS
10.0000 mg | ORAL_TABLET | Freq: Once | ORAL | Status: AC
  Administered 2024-01-28: 10 mg via ORAL
  Filled 2024-01-28: qty 2

## 2024-01-28 MED ORDER — AMLODIPINE BESYLATE 10 MG PO TABS: 10.0000 mg | ORAL_TABLET | Freq: Every day | ORAL | 1 refills | Status: AC

## 2024-01-28 MED ORDER — ACETAMINOPHEN 500 MG PO TABS
1000.0000 mg | ORAL_TABLET | Freq: Once | ORAL | Status: AC
  Administered 2024-01-28: 1000 mg via ORAL
  Filled 2024-01-28: qty 2

## 2024-01-28 NOTE — ED Provider Notes (Signed)
 Emergency Department Provider Note  TRIAGE NOTE: Pt c/o lower back pain and states he is dizzy. Pt smells of etoh. Pt states he has been out of htn meds for several days.   HISTORY  Chief Complaint No chief complaint on file.   HPI Terrence Strickland is a 58 y.o. male with  lower back pain. The pain is localized to the lower back and the patient denies any recent falls, accidents, or trauma. The patient reports a history of hypertension and is prescribed Amlodipine  (Norvasc ) but indicates non-compliance with the medication. The patient describes feeling swimming when blood pressure is elevated. The patient denies any incontinence of urine or stool and has not taken any medication for the back pain at home. The patient admits to consuming alcohol, having had a few cans prior to presentation. History was obtained from the patient.  PMH Past Medical History:  Diagnosis Date   ADHD (attention deficit hyperactivity disorder)    Alcohol abuse    Depression    Hypertension    Irregular heart beat     Home Medications Prior to Admission medications   Medication Sig Start Date End Date Taking? Authorizing Provider  amLODipine  (NORVASC ) 10 MG tablet Take 1 tablet (10 mg total) by mouth daily. 01/28/24  Yes Leiloni Smithers, Selinda, MD  acetaminophen  (TYLENOL ) 325 MG tablet Take 2 tablets (650 mg total) by mouth every 6 (six) hours as needed. 11/09/23   Shaqueena Mauceri, Selinda, MD  ammonium lactate  (AMLACTIN) 12 % cream Apply 1 Application topically as needed for dry skin. 07/05/23   Gershon Donnice SAUNDERS, DPM  ciclopirox  (PENLAC ) 8 % solution Apply topically at bedtime. Apply over nail and surrounding skin. Apply daily over previous coat. After seven (7) days, may remove with alcohol and continue cycle. 07/05/23   Gershon Donnice SAUNDERS, DPM  pantoprazole  (PROTONIX ) 40 MG tablet Take 1 tablet (40 mg total) by mouth daily. 07/23/23   Melvenia Manus BRAVO, MD  Vitamin D , Ergocalciferol , (DRISDOL ) 1.25 MG (50000 UNIT) CAPS capsule  Take 1 capsule (50,000 Units total) by mouth every 7 (seven) days. 10/16/23   Melvenia Manus BRAVO, MD    Social History Social History   Tobacco Use   Smoking status: Every Day    Current packs/day: 0.50    Average packs/day: 0.5 packs/day for 15.0 years (7.5 ttl pk-yrs)    Types: Cigarettes   Smokeless tobacco: Never  Substance Use Topics   Alcohol use: Yes    Comment: 6pack beer a day   Drug use: No    Review of Systems: Documented in HPI ____________________________________________  PHYSICAL EXAM: VITAL SIGNS: Triage: Blood pressure (!) 141/97, pulse 71, temperature (!) 97 F (36.1 C), temperature source Oral, resp. rate 16, height 5' 10 (1.778 m), weight 76 kg, SpO2 97%.  Vitals:   01/28/24 0145 01/28/24 0200 01/28/24 0248 01/28/24 0249  BP: (!) 171/153 (!) 156/123 (!) 156/123 (!) 141/97  Pulse: 86 74  71  Resp: 18   16  Temp: (!) 97 F (36.1 C)     TempSrc: Oral     SpO2: 97% 93%  97%  Weight:      Height:        Physical Exam Vitals and nursing note reviewed.  Constitutional:      Appearance: He is well-developed.  HENT:     Head: Normocephalic and atraumatic.  Eyes:     Pupils: Pupils are equal, round, and reactive to light.     Comments: Conjunctival injection  Cardiovascular:  Rate and Rhythm: Normal rate.  Pulmonary:     Effort: Pulmonary effort is normal. No respiratory distress.  Abdominal:     General: There is no distension.  Musculoskeletal:        General: Normal range of motion.     Cervical back: Normal range of motion.  Skin:    General: Skin is warm and dry.  Neurological:     Mental Status: He is alert.     Comments: Slurring speech       ____________________________________________   LABS (all labs ordered are listed, but only abnormal results are displayed)  Labs Reviewed - No data to display ____________________________________________  EKG   EKG Interpretation Date/Time:    Ventricular Rate:    PR Interval:    QRS  Duration:    QT Interval:    QTC Calculation:   R Axis:      Text Interpretation:          ____________________________________________  RADIOLOGY  No results found. ____________________________________________  PROCEDURES  Procedure(s) performed:   Procedures ____________________________________________  INITIAL IMPRESSION / ASSESSMENT AND PLAN     Initial DDx:  ED Course  Images ordered viewed and obtained by myself. Agree with Radiology interpretation. Details in ED course.  Labs ordered reviewed by myself as detailed in ED course.  Consultations obtained/considered detailed in ED course.   Clinical Course as of 01/28/24 0613  Tue Jan 28, 2024  0303 BP(!): 141/97 [JM]  0303 BP(!): 171/153 Out of norvasc  reportedly [JM]    Clinical Course User Index [JM] Kalep Full, Selinda, MD       CRITICAL INTERVENTIONS:  N/a  FINAL IMPRESSION Final diagnoses:  Hypertension, unspecified type    Intoxicated, hypertensive, likely MSK back pain. Will allow to metabolize alcohol and reassess. Rx for norvasc  if needed.   Blood pressure improved. Back pain improved. Low suspicion for AAA or other acute causes for symptoms.   Disposition A medical screening exam was performed and I feel the patient has had an appropriate workup for their chief complaint at this time and likelihood of emergent condition existing is low. They have been counseled on decision, DISCHARGE, follow up and which symptoms necessitate immediate return to the emergency department. They or their family verbally stated understanding and agreement with plan and discharged in stable condition.   ____________________________________________   NEW OUTPATIENT MEDICATIONS STARTED DURING THIS VISIT:  Current Discharge Medication List      Note:  This note was prepared with assistance of Dragon voice recognition software. Occasional wrong-word or sound-a-like substitutions may have occurred due to the  inherent limitations of voice recognition software.    Lorette Selinda, MD 01/28/24 [phone]

## 2024-01-28 NOTE — ED Triage Notes (Addendum)
 Pt c/o lower back pain and states he is dizzy. Pt smells of etoh. Pt states he has been out of htn meds for several days.

## 2024-01-30 ENCOUNTER — Encounter (HOSPITAL_COMMUNITY): Payer: Self-pay | Admitting: Emergency Medicine

## 2024-01-30 ENCOUNTER — Emergency Department (HOSPITAL_COMMUNITY)
Admission: EM | Admit: 2024-01-30 | Discharge: 2024-01-30 | Disposition: A | Discharge status: Home / Self Care | Attending: Emergency Medicine | Admitting: Emergency Medicine

## 2024-01-30 ENCOUNTER — Other Ambulatory Visit: Payer: Self-pay

## 2024-01-30 DIAGNOSIS — M545 Low back pain, unspecified: Secondary | ICD-10-CM | POA: Insufficient documentation

## 2024-01-30 DIAGNOSIS — F17210 Nicotine dependence, cigarettes, uncomplicated: Secondary | ICD-10-CM | POA: Diagnosis not present

## 2024-01-30 DIAGNOSIS — Z79899 Other long term (current) drug therapy: Secondary | ICD-10-CM | POA: Insufficient documentation

## 2024-01-30 DIAGNOSIS — I10 Essential (primary) hypertension: Secondary | ICD-10-CM | POA: Diagnosis not present

## 2024-01-30 DIAGNOSIS — G8929 Other chronic pain: Secondary | ICD-10-CM | POA: Diagnosis not present

## 2024-01-30 MED ORDER — LIDOCAINE 5 % EX PTCH
1.0000 | MEDICATED_PATCH | CUTANEOUS | Status: DC
  Administered 2024-01-30: 1 via TRANSDERMAL
  Filled 2024-01-30: qty 1

## 2024-01-30 MED ORDER — ACETAMINOPHEN 500 MG PO TABS
1000.0000 mg | ORAL_TABLET | Freq: Once | ORAL | Status: AC
  Administered 2024-01-30: 1000 mg via ORAL
  Filled 2024-01-30: qty 2

## 2024-01-30 NOTE — ED Triage Notes (Signed)
 Pt c/o of chronic lower back pain. Pt has been drinking ETOH all night.

## 2024-01-30 NOTE — ED Provider Notes (Signed)
 [place] EMERGENCY DEPARTMENT AT [facility] Provider Note  CSN: [id number] Arrival date & time: 01/30/24 9291  Chief Complaint(s) Back Pain  HPI Terrence Strickland is a 58 y.o. male history of alcohol use disorder, hypertension presenting to the emergency department with low back pain.  Patient reports chronic low back pain.  Reports that it is worse than normal.  Located low back.  Does not radiate.  Denies any bowel or bladder incontinence.  Denies any fevers or chills.  Denies any trouble walking.  Denies any radiation of the legs.  Denies any numbness or tingling.  Denies any weakness in the legs.  Denies any history of IV drug use.  Denies any trauma to the back.  Symptoms mild.  Does endorse drinking 1 beer last night.   Past Medical History Past Medical History:  Diagnosis Date   ADHD (attention deficit hyperactivity disorder)    Alcohol abuse    Depression    Hypertension    Irregular heart beat    Patient Active Problem List   Diagnosis Date Noted   ERRONEOUS ENCOUNTER--DISREGARD 10/16/2023   Vitamin D  deficiency 08/18/2023   Acute on chronic low back pain 08/18/2023   Corn of foot 06/25/2023   Colon cancer screening 06/25/2023   ROTAVIRUS Acute gastroenteritis 10/26/2021   Tobacco abuse 10/26/2021   AKI (acute kidney injury) (HCC) 10/25/2021   Essential hypertension 10/25/2021   Reflux esophagitis 10/25/2021   Hematemesis 10/18/2021   Alcohol abuse 10/18/2021   Hypokalemia 10/18/2021   Alcohol intoxication (HCC) 10/18/2021   Multiple thyroid nodules 02/20/2018   Herniated lumbar intervertebral disc 03/18/2013   Lumbosacral spondylosis without myelopathy 03/18/2013   DEGENERATIVE DISC DISEASE, LUMBOSACRAL SPINE 10/18/2008   Home Medication(s) Prior to Admission medications   Medication Sig Start Date End Date Taking? Authorizing Provider  acetaminophen  (TYLENOL ) 325 MG tablet Take 2 tablets (650 mg total) by mouth every 6 (six) hours as needed. 11/09/23    Mesner, Selinda, MD  amLODipine  (NORVASC ) 10 MG tablet Take 1 tablet (10 mg total) by mouth daily. 01/28/24   Mesner, Selinda, MD  ammonium lactate  (AMLACTIN) 12 % cream Apply 1 Application topically as needed for dry skin. 07/05/23   Gershon Donnice SAUNDERS, DPM  ciclopirox  (PENLAC ) 8 % solution Apply topically at bedtime. Apply over nail and surrounding skin. Apply daily over previous coat. After seven (7) days, may remove with alcohol and continue cycle. 07/05/23   Gershon Donnice SAUNDERS, DPM  pantoprazole  (PROTONIX ) 40 MG tablet Take 1 tablet (40 mg total) by mouth daily. 07/23/23   Melvenia Manus BRAVO, MD  Vitamin D , Ergocalciferol , (DRISDOL ) 1.25 MG (50000 UNIT) CAPS capsule Take 1 capsule (50,000 Units total) by mouth every 7 (seven) days. 10/16/23   Melvenia Manus BRAVO, MD  Past Surgical History Past Surgical History:  Procedure Laterality Date   ESOPHAGOGASTRODUODENOSCOPY (EGD) WITH PROPOFOL  N/A 10/19/2021   Procedure: ESOPHAGOGASTRODUODENOSCOPY (EGD) WITH PROPOFOL ;  Surgeon: Shaaron Lamar HERO, MD;  Location: [facility];  Service: Endoscopy;  Laterality: N/A;   Family History Family History  Problem Relation Age of Onset   Cancer Other    Diabetes Other     Social History Social History   Tobacco Use   Smoking status: Every Day    Current packs/day: 0.50    Average packs/day: 0.5 packs/day for 15.0 years (7.5 ttl pk-yrs)    Types: Cigarettes   Smokeless tobacco: Never  Substance Use Topics   Alcohol use: Yes    Comment: 6pack beer a day   Drug use: No   Allergies Nsaids and Tramadol   Review of Systems Review of Systems  All other systems reviewed and are negative.   Physical Exam Vital Signs  I have reviewed the triage vital signs BP (!) 138/98   Pulse (!) 55   Temp (!) 97.3 F (36.3 C) (Oral)   Resp 15   Ht 5' 10 (1.778 m)   Wt 76 kg   SpO2 96%    BMI 24.04 kg/m  Physical Exam Vitals and nursing note reviewed.  Constitutional:      General: He is not in acute distress.    Appearance: Normal appearance.     Comments: Clinically sober  HENT:     Head: Normocephalic and atraumatic.     Mouth/Throat:     Mouth: Mucous membranes are moist.  Eyes:     Conjunctiva/sclera: Conjunctivae normal.  Cardiovascular:     Rate and Rhythm: Normal rate.  Pulmonary:     Effort: Pulmonary effort is normal. No respiratory distress.  Abdominal:     General: Abdomen is flat.     Tenderness: There is no right CVA tenderness or left CVA tenderness.  Musculoskeletal:     Comments: No midline C, T, L-spine tenderness  Skin:    General: Skin is warm and dry.     Capillary Refill: Capillary refill takes less than 2 seconds.  Neurological:     General: No focal deficit present.     Mental Status: He is alert. Mental status is at baseline.     Comments: Strength 5 out of 5 in the bilateral lower extremities with no sensory deficit  Psychiatric:        Mood and Affect: Mood normal.        Behavior: Behavior normal.     ED Results and Treatments Labs (all labs ordered are listed, but only abnormal results are displayed) Labs Reviewed - No data to display                                                                                                                        Radiology No results found.  Pertinent labs & imaging results that were available during my care of the patient were reviewed  by me and considered in my medical decision making (see MDM for details).  Medications Ordered in ED Medications  lidocaine  (LIDODERM ) 5 % 1 patch (has no administration in time range)  acetaminophen  (TYLENOL ) tablet 1,000 mg (has no administration in time range)                                                                                                                                     Procedures Procedures  (including critical care  time)  Medical Decision Making / ED Course   MDM:  58 year old presenting with chronic back pain.  Patient has frequent ER presentations for similar.  Denies any red flag symptoms such as trauma, fevers, neurologic symptoms.  Seems consistent with chronic pain.  Do not think patient needs further ER workup at this time.  Recommend that he take Tylenol  as needed for pain.  Patient does endorse some alcohol use but he reports he only drink 1 beer last night.  Clinically he is sober, ambulating with no difficulty  Will discharge patient to home. All questions answered. Patient comfortable with plan of discharge. Return precautions discussed with patient and specified on the after visit summary.       Additional history obtained:  -External records from outside source obtained and reviewed including: Chart review including previous notes, labs, imaging, consultation notes including prior ER visits     Medicines ordered and prescription drug management: Meds ordered this encounter  Medications   lidocaine  (LIDODERM ) 5 % 1 patch   acetaminophen  (TYLENOL ) tablet 1,000 mg    -I have reviewed the patients home medicines and have made adjustments as needed  Social Determinants of Health:  Diagnosis or treatment significantly limited by social determinants of health: alcohol use   Reevaluation: After the interventions noted above, I reevaluated the patient and found that their symptoms have improved  Co morbidities that complicate the patient evaluation  Past Medical History:  Diagnosis Date   ADHD (attention deficit hyperactivity disorder)    Alcohol abuse    Depression    Hypertension    Irregular heart beat       Dispostion: Disposition decision including need for hospitalization was considered, and patient discharged from emergency department.    Final Clinical Impression(s) / ED Diagnoses Final diagnoses:  Chronic midline low back pain without sciatica      This chart was dictated using voice recognition software.  Despite best efforts to proofread,  errors can occur which can change the documentation meaning.    Francesca Elsie CROME, MD 01/30/24 [phone]

## 2024-01-30 NOTE — Discharge Instructions (Signed)
 Please take 1000 mg of tylenol  every 6 hours as needed for pain. Return for any new or worsening symptoms.

## 2024-02-06 ENCOUNTER — Encounter (HOSPITAL_COMMUNITY): Payer: Self-pay

## 2024-02-06 ENCOUNTER — Other Ambulatory Visit: Payer: Self-pay

## 2024-02-06 ENCOUNTER — Emergency Department (HOSPITAL_COMMUNITY)
Admission: EM | Admit: 2024-02-06 | Discharge: 2024-02-07 | Disposition: A | Discharge status: Home / Self Care | Attending: Emergency Medicine | Admitting: Emergency Medicine

## 2024-02-06 DIAGNOSIS — I10 Essential (primary) hypertension: Secondary | ICD-10-CM | POA: Insufficient documentation

## 2024-02-06 DIAGNOSIS — M545 Low back pain, unspecified: Secondary | ICD-10-CM | POA: Insufficient documentation

## 2024-02-06 DIAGNOSIS — Z79899 Other long term (current) drug therapy: Secondary | ICD-10-CM | POA: Diagnosis not present

## 2024-02-06 NOTE — ED Triage Notes (Signed)
 Pt c/c of lower back pain that has been going on for about a day and a half stating Its hurting its just hurting. Pain rated 7.5/10 in the center of his lower back. Nothing has been taken for the pain.

## 2024-02-07 MED ORDER — IBUPROFEN 400 MG PO TABS
600.0000 mg | ORAL_TABLET | Freq: Once | ORAL | Status: AC
  Administered 2024-02-07: 600 mg via ORAL
  Filled 2024-02-07: qty 2

## 2024-02-07 MED ORDER — CYCLOBENZAPRINE HCL 10 MG PO TABS: 10.0000 mg | ORAL_TABLET | Freq: Two times a day (BID) | ORAL | 0 refills | Status: AC | PRN

## 2024-02-07 MED ORDER — NAPROXEN 500 MG PO TABS: 500.0000 mg | ORAL_TABLET | Freq: Two times a day (BID) | ORAL | 0 refills | Status: AC

## 2024-02-07 MED ORDER — CYCLOBENZAPRINE HCL 10 MG PO TABS
10.0000 mg | ORAL_TABLET | Freq: Once | ORAL | Status: AC
  Administered 2024-02-07: 10 mg via ORAL
  Filled 2024-02-07: qty 1

## 2024-02-07 NOTE — ED Provider Notes (Signed)
 [place] EMERGENCY DEPARTMENT AT [facility] Provider Note   CSN: [id number] Arrival date & time: 02/06/24  2323     Patient presents with: Back Pain   Terrence Strickland is a 58 y.o. male.   Patient is a 58 year old male with past medical history of chronic back pain, hypertension.  Patient presenting today for evaluation of back pain.  This has been ongoing for many years.  He denies any new injury or trauma.  No bowel or bladder complaints.  No radiation into his legs.       Prior to Admission medications   Medication Sig Start Date End Date Taking? Authorizing Provider  acetaminophen  (TYLENOL ) 325 MG tablet Take 2 tablets (650 mg total) by mouth every 6 (six) hours as needed. 11/09/23   Mesner, Selinda, MD  amLODipine  (NORVASC ) 10 MG tablet Take 1 tablet (10 mg total) by mouth daily. 01/28/24   Mesner, Selinda, MD  ammonium lactate  (AMLACTIN) 12 % cream Apply 1 Application topically as needed for dry skin. 07/05/23   Gershon Donnice SAUNDERS, DPM  ciclopirox  (PENLAC ) 8 % solution Apply topically at bedtime. Apply over nail and surrounding skin. Apply daily over previous coat. After seven (7) days, may remove with alcohol and continue cycle. 07/05/23   Gershon Donnice SAUNDERS, DPM  pantoprazole  (PROTONIX ) 40 MG tablet Take 1 tablet (40 mg total) by mouth daily. 07/23/23   Melvenia Manus BRAVO, MD  Vitamin D , Ergocalciferol , (DRISDOL ) 1.25 MG (50000 UNIT) CAPS capsule Take 1 capsule (50,000 Units total) by mouth every 7 (seven) days. 10/16/23   Melvenia Manus BRAVO, MD    Allergies: Nsaids and Tramadol     Review of Systems  All other systems reviewed and are negative.   Updated Vital Signs BP (!) 164/117 (BP Location: Right Arm)   Pulse 79   Temp 99.3 F (37.4 C) (Oral)   Resp 16   Ht 5' 9 (1.753 m)   Wt 72.6 kg   SpO2 99%   BMI 23.63 kg/m   Physical Exam Vitals and nursing note reviewed.  Constitutional:      Appearance: Normal appearance.  Pulmonary:     Effort: Pulmonary effort is  normal.  Musculoskeletal:     Comments: There is no bony tenderness in the lumbar spine.  Skin:    General: Skin is warm and dry.  Neurological:     Mental Status: He is alert and oriented to person, place, and time.     Comments: Strength is 5 out of 5 in both lower extremities.  Sensation is intact throughout both lower extremities.     (all labs ordered are listed, but only abnormal results are displayed) Labs Reviewed - No data to display  EKG: None  Radiology: No results found.   Procedures   Medications Ordered in the ED  cyclobenzaprine  (FLEXERIL ) tablet 10 mg (has no administration in time range)  ibuprofen  (ADVIL ) tablet 600 mg (has no administration in time range)                                    Medical Decision Making Risk Prescription drug management.   Patient with longstanding history of back pain presenting with complaints of back pain as described in the HPI.  Arrives with stable vital signs and is afebrile.  Physical examination is unremarkable with no neurologic deficits.  There are no red flags in his history or exam that  would suggest an emergent situation.  I will treat with Flexeril  and ibuprofen .  To follow-up as needed.     Final diagnoses:  None    ED Discharge Orders     None          Geroldine Berg, MD 02/07/24 [phone]

## 2024-02-07 NOTE — Discharge Instructions (Signed)
 Begin taking naproxen  as prescribed.  Begin taking Flexeril  as needed for pain not relieved with naproxen .  Follow-up with primary doctor if not improving in the next week.

## 2024-02-28 ENCOUNTER — Emergency Department (HOSPITAL_COMMUNITY)
Admission: EM | Admit: 2024-02-28 | Discharge: 2024-02-29 | Disposition: A | Discharge status: Home / Self Care | Attending: Emergency Medicine | Admitting: Emergency Medicine

## 2024-02-28 ENCOUNTER — Encounter (HOSPITAL_COMMUNITY): Payer: Self-pay

## 2024-02-28 ENCOUNTER — Other Ambulatory Visit: Payer: Self-pay

## 2024-02-28 DIAGNOSIS — Z79899 Other long term (current) drug therapy: Secondary | ICD-10-CM | POA: Insufficient documentation

## 2024-02-28 DIAGNOSIS — M549 Dorsalgia, unspecified: Secondary | ICD-10-CM | POA: Diagnosis present

## 2024-02-28 DIAGNOSIS — I10 Essential (primary) hypertension: Secondary | ICD-10-CM | POA: Insufficient documentation

## 2024-02-28 DIAGNOSIS — G8929 Other chronic pain: Secondary | ICD-10-CM | POA: Insufficient documentation

## 2024-02-28 DIAGNOSIS — M545 Low back pain, unspecified: Secondary | ICD-10-CM | POA: Diagnosis not present

## 2024-02-28 NOTE — ED Notes (Signed)
 Pt pacing floor in room and continues to talk to himself.

## 2024-02-28 NOTE — ED Notes (Signed)
 Ambulated to restroom

## 2024-02-28 NOTE — ED Notes (Signed)
 Pt refused mediation EDP offered. Stated, I'm getting out like a motherfucker.

## 2024-02-28 NOTE — ED Notes (Signed)
 ED Provider at bedside.

## 2024-02-28 NOTE — ED Triage Notes (Signed)
 Pov from home. Cc of chronic back pain. States he doesn't have any gristle ambulatory to tx room from lobby

## 2024-02-29 MED ORDER — IBUPROFEN 400 MG PO TABS
600.0000 mg | ORAL_TABLET | Freq: Once | ORAL | Status: AC
  Administered 2024-02-29: 600 mg via ORAL
  Filled 2024-02-29: qty 2

## 2024-02-29 NOTE — ED Provider Notes (Signed)
 [facility]  Provider Note  CSN: [id number] Arrival date & time: 02/28/24 2317  History Chief Complaint  Patient presents with   Back Pain    TREASURE INGRUM is a 58 y.o. male with history of chronic back pain, poorly controlled HTN and alcohol use disorder here for back pain. Seen in the ED frequently for same. Does not follow with PCP. No new symptoms tonight.    Home Medications Prior to Admission medications   Medication Sig Start Date End Date Taking? Authorizing Provider  acetaminophen  (TYLENOL ) 325 MG tablet Take 2 tablets (650 mg total) by mouth every 6 (six) hours as needed. 11/09/23   Mesner, Selinda, MD  amLODipine  (NORVASC ) 10 MG tablet Take 1 tablet (10 mg total) by mouth daily. 01/28/24   Mesner, Selinda, MD  ammonium lactate  (AMLACTIN) 12 % cream Apply 1 Application topically as needed for dry skin. 07/05/23   Gershon Donnice SAUNDERS, DPM  ciclopirox  (PENLAC ) 8 % solution Apply topically at bedtime. Apply over nail and surrounding skin. Apply daily over previous coat. After seven (7) days, may remove with alcohol and continue cycle. 07/05/23   Gershon Donnice SAUNDERS, DPM  cyclobenzaprine  (FLEXERIL ) 10 MG tablet Take 1 tablet (10 mg total) by mouth 2 (two) times daily as needed for muscle spasms. 02/07/24   Geroldine Berg, MD  naproxen  (NAPROSYN ) 500 MG tablet Take 1 tablet (500 mg total) by mouth 2 (two) times daily. 02/07/24   Geroldine Berg, MD  pantoprazole  (PROTONIX ) 40 MG tablet Take 1 tablet (40 mg total) by mouth daily. 07/23/23   Melvenia Manus BRAVO, MD  Vitamin D , Ergocalciferol , (DRISDOL ) 1.25 MG (50000 UNIT) CAPS capsule Take 1 capsule (50,000 Units total) by mouth every 7 (seven) days. 10/16/23   Melvenia Manus BRAVO, MD     Allergies    Nsaids and Tramadol    Review of Systems   Review of Systems Please see HPI for pertinent positives and negatives  Physical Exam BP (!) 178/122   Pulse 94   Temp 98.1 F (36.7 C) (Oral)   Resp (!) 21   Ht  5' 9 (1.753 m)   Wt 72.6 kg   SpO2 95%   BMI 23.63 kg/m   Physical Exam Vitals and nursing note reviewed.  HENT:     Head: Normocephalic.     Nose: Nose normal.  Eyes:     Extraocular Movements: Extraocular movements intact.  Pulmonary:     Effort: Pulmonary effort is normal.  Musculoskeletal:        General: No tenderness (no tenderness when distracted). Normal range of motion.     Cervical back: Neck supple.  Skin:    Findings: No rash (on exposed skin).  Neurological:     Mental Status: He is alert and oriented to person, place, and time.     Gait: Gait normal.  Psychiatric:        Mood and Affect: Mood normal.     ED Results / Procedures / Treatments   EKG None  Procedures Procedures  Medications Ordered in the ED Medications  ibuprofen  (ADVIL ) tablet 600 mg (has no administration in time range)    Initial Impression and Plan  Patient here for chronic pain, similar to previous. Offered PO Motrin  which he initially refused while asking for something stronger, but when told he does not have any indication for narcotics he has accepted. EMR lists an allergy to NSAIDs, but he has tolerated in the past.  Advised to continue his BP medications, curtail his alcohol use and follow up with PCP for long term management.   ED Course       MDM Rules/Calculators/A&P Medical Decision Making Problems Addressed: Chronic low back pain without sciatica, unspecified back pain laterality: chronic illness or injury  Risk Prescription drug management.     Final Clinical Impression(s) / ED Diagnoses Final diagnoses:  Chronic low back pain without sciatica, unspecified back pain laterality    Rx / DC Orders ED Discharge Orders     None        Roselyn Carlin NOVAK, MD 02/29/24 0004

## 2024-06-09 NOTE — Congregational Nurse Program (Unsigned)
 Terrence Strickland is a 59 year old black male with hx of chronic hypertension. H e stated would not take medication. I did inform him that his blood pressure  was dangerous for a stroke.   Apolinar Slade RN MSN DNP
